# Patient Record
Sex: Female | Born: 1945 | Race: Black or African American | Hispanic: No | Smoking: Former smoker
Health system: Southern US, Community
[De-identification: ages and names within clinical notes are randomized; demographics above are authoritative.]

## PROBLEM LIST (undated history)

## (undated) DIAGNOSIS — G4730 Sleep apnea, unspecified: Secondary | ICD-10-CM

## (undated) DIAGNOSIS — F32A Depression, unspecified: Secondary | ICD-10-CM

## (undated) DIAGNOSIS — F329 Major depressive disorder, single episode, unspecified: Secondary | ICD-10-CM

## (undated) DIAGNOSIS — J45909 Unspecified asthma, uncomplicated: Secondary | ICD-10-CM

## (undated) DIAGNOSIS — I10 Essential (primary) hypertension: Secondary | ICD-10-CM

## (undated) HISTORY — PX: COLON SURGERY: SHX602

## (undated) HISTORY — DX: Sleep apnea, unspecified: G47.30

## (undated) HISTORY — PX: HERNIA REPAIR: SHX51

## (undated) HISTORY — DX: Unspecified asthma, uncomplicated: J45.909

## (undated) HISTORY — DX: Depression, unspecified: F32.A

## (undated) HISTORY — DX: Essential (primary) hypertension: I10

## (undated) HISTORY — PX: ABDOMINAL HYSTERECTOMY: SHX81

## (undated) HISTORY — PX: EYE SURGERY: SHX253

## (undated) HISTORY — DX: Major depressive disorder, single episode, unspecified: F32.9

---

## 2004-01-30 ENCOUNTER — Encounter: Payer: Self-pay | Admitting: Family Medicine

## 2004-03-26 ENCOUNTER — Emergency Department: Payer: Self-pay | Admitting: Internal Medicine

## 2004-09-06 ENCOUNTER — Encounter: Payer: Self-pay | Admitting: Orthopaedic Surgery

## 2004-09-25 ENCOUNTER — Encounter: Payer: Self-pay | Admitting: Orthopaedic Surgery

## 2005-01-06 ENCOUNTER — Ambulatory Visit: Payer: Self-pay | Admitting: Family Medicine

## 2005-09-22 ENCOUNTER — Emergency Department: Payer: Self-pay | Admitting: Internal Medicine

## 2005-10-15 ENCOUNTER — Ambulatory Visit: Payer: Self-pay | Admitting: Family Medicine

## 2006-01-04 ENCOUNTER — Emergency Department: Payer: Self-pay | Admitting: Emergency Medicine

## 2006-04-10 ENCOUNTER — Emergency Department: Payer: Self-pay | Admitting: Emergency Medicine

## 2006-04-21 ENCOUNTER — Other Ambulatory Visit: Payer: Self-pay

## 2006-04-21 ENCOUNTER — Emergency Department: Payer: Self-pay | Admitting: Unknown Physician Specialty

## 2006-07-07 ENCOUNTER — Emergency Department: Payer: Self-pay | Admitting: Emergency Medicine

## 2006-10-11 ENCOUNTER — Emergency Department: Payer: Self-pay | Admitting: Emergency Medicine

## 2006-10-26 ENCOUNTER — Ambulatory Visit: Payer: Self-pay | Admitting: Family Medicine

## 2006-11-10 ENCOUNTER — Encounter: Payer: Self-pay | Admitting: Family Medicine

## 2006-11-26 ENCOUNTER — Encounter: Payer: Self-pay | Admitting: Family Medicine

## 2007-07-14 ENCOUNTER — Inpatient Hospital Stay: Payer: Self-pay | Admitting: *Deleted

## 2007-07-14 ENCOUNTER — Other Ambulatory Visit: Payer: Self-pay

## 2008-06-09 ENCOUNTER — Emergency Department (HOSPITAL_COMMUNITY): Admission: EM | Admit: 2008-06-09 | Discharge: 2008-06-09 | Payer: Self-pay | Admitting: Emergency Medicine

## 2008-06-19 ENCOUNTER — Ambulatory Visit: Payer: Self-pay | Admitting: Gastroenterology
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
## 2010-02-09 ENCOUNTER — Emergency Department: Payer: Self-pay | Admitting: Emergency Medicine

## 2010-07-01 ENCOUNTER — Ambulatory Visit: Payer: Self-pay | Admitting: Emergency Medicine

## 2010-08-05 ENCOUNTER — Ambulatory Visit: Payer: Self-pay | Admitting: Family Medicine

## 2010-08-12 LAB — URINALYSIS, ROUTINE W REFLEX MICROSCOPIC
Bilirubin Urine: NEGATIVE
Glucose, UA: NEGATIVE mg/dL
Hgb urine dipstick: NEGATIVE
Ketones, ur: NEGATIVE mg/dL
Nitrite: NEGATIVE
Protein, ur: NEGATIVE mg/dL
Specific Gravity, Urine: 1.016 (ref 1.005–1.030)
Urobilinogen, UA: 1 mg/dL (ref 0.0–1.0)
pH: 5.5 (ref 5.0–8.0)

## 2010-08-12 LAB — CBC
HCT: 40.7 % (ref 36.0–46.0)
Hemoglobin: 14.1 g/dL (ref 12.0–15.0)
MCHC: 34.7 g/dL (ref 30.0–36.0)
MCV: 84.9 fL (ref 78.0–100.0)
Platelets: 358 10*3/uL (ref 150–400)
RBC: 4.79 MIL/uL (ref 3.87–5.11)
RDW: 13.4 % (ref 11.5–15.5)
WBC: 9.1 10*3/uL (ref 4.0–10.5)

## 2010-08-12 LAB — COMPREHENSIVE METABOLIC PANEL
ALT: 36 U/L — ABNORMAL HIGH (ref 0–35)
AST: 45 U/L — ABNORMAL HIGH (ref 0–37)
Albumin: 3.8 g/dL (ref 3.5–5.2)
Alkaline Phosphatase: 129 U/L — ABNORMAL HIGH (ref 39–117)
BUN: 16 mg/dL (ref 6–23)
CO2: 25 mEq/L (ref 19–32)
Calcium: 9.5 mg/dL (ref 8.4–10.5)
Chloride: 103 mEq/L (ref 96–112)
Creatinine, Ser: 0.84 mg/dL (ref 0.4–1.2)
GFR calc Af Amer: >60 mL/min (ref >60)
GFR calc non Af Amer: >60 mL/min (ref >60)
Glucose, Bld: 117 mg/dL — ABNORMAL HIGH (ref 70–99)
Potassium: 3.8 mEq/L (ref 3.5–5.1)
Sodium: 139 mEq/L (ref 135–145)
Total Bilirubin: 0.7 mg/dL (ref 0.3–1.2)
Total Protein: 7.5 g/dL (ref 6.0–8.3)

## 2010-08-12 LAB — DIFFERENTIAL
Basophils Absolute: 0 10*3/uL (ref 0.0–0.1)
Basophils Relative: 0 % (ref 0–1)
Eosinophils Absolute: 0.2 10*3/uL (ref 0.0–0.7)
Eosinophils Relative: 2 % (ref 0–5)
Lymphocytes Relative: 36 % (ref 12–46)
Lymphs Abs: 3.3 10*3/uL (ref 0.7–4.0)
Monocytes Absolute: 0.8 10*3/uL (ref 0.1–1.0)
Monocytes Relative: 9 % (ref 3–12)
Neutro Abs: 4.8 10*3/uL (ref 1.7–7.7)
Neutrophils Relative %: 53 % (ref 43–77)

## 2010-08-12 LAB — HEMOCCULT GUIAC POC 1CARD (OFFICE): Fecal Occult Bld: POSITIVE

## 2010-08-12 LAB — URINE MICROSCOPIC-ADD ON

## 2011-04-05 ENCOUNTER — Emergency Department: Payer: Self-pay | Admitting: Internal Medicine

## 2011-05-04 ENCOUNTER — Ambulatory Visit: Payer: Self-pay | Admitting: Unknown Physician Specialty

## 2011-05-04 LAB — BASIC METABOLIC PANEL
BUN: 8 mg/dL (ref 7–18)
Calcium, Total: 8.8 mg/dL (ref 8.5–10.1)
Creatinine: 0.63 mg/dL (ref 0.60–1.30)
EGFR (African American): >60
EGFR (Non-African Amer.): >60
Glucose: 111 mg/dL — ABNORMAL HIGH (ref 65–99)
Sodium: 144 mmol/L (ref 136–145)

## 2011-05-04 LAB — URINALYSIS, COMPLETE
Bacteria: NONE SEEN
Bilirubin,UR: NEGATIVE
Glucose,UR: NEGATIVE mg/dL (ref 0–75)
Ketone: NEGATIVE
Ph: 5 (ref 4.5–8.0)
RBC,UR: 2 /HPF (ref 0–5)
Squamous Epithelial: 3
WBC UR: 1 /HPF (ref 0–5)

## 2011-05-04 LAB — CBC
HGB: 13.9 g/dL (ref 12.0–16.0)
MCH: 29.6 pg (ref 26.0–34.0)
MCHC: 34.2 g/dL (ref 32.0–36.0)
MCV: 87 fL (ref 80–100)
RBC: 4.69 10*6/uL (ref 3.80–5.20)

## 2011-05-14 ENCOUNTER — Inpatient Hospital Stay: Payer: Self-pay | Admitting: Unknown Physician Specialty

## 2011-05-15 LAB — HEMOGLOBIN: HGB: 11.9 g/dL — ABNORMAL LOW (ref 12.0–16.0)

## 2011-05-15 LAB — URINALYSIS, COMPLETE
Bacteria: NONE SEEN
Glucose,UR: NEGATIVE mg/dL (ref 0–75)
Ketone: NEGATIVE
Protein: NEGATIVE
RBC,UR: 6 /HPF (ref 0–5)
Specific Gravity: 1.011 (ref 1.003–1.030)
Squamous Epithelial: NONE SEEN
WBC UR: 2 /HPF (ref 0–5)

## 2011-05-15 LAB — TSH: Thyroid Stimulating Horm: 1.74 u[IU]/mL

## 2011-05-15 LAB — BASIC METABOLIC PANEL
BUN: 19 mg/dL — ABNORMAL HIGH (ref 7–18)
Chloride: 101 mmol/L (ref 98–107)
Creatinine: 1.02 mg/dL (ref 0.60–1.30)
EGFR (African American): >60
EGFR (Non-African Amer.): 58 — ABNORMAL LOW
Glucose: 136 mg/dL — ABNORMAL HIGH (ref 65–99)

## 2011-05-15 LAB — TROPONIN I: Troponin-I: <0.02 ng/mL

## 2011-05-15 LAB — CK-MB
CK-MB: 3.7 ng/mL — ABNORMAL HIGH (ref 0.5–3.6)
CK-MB: 4.9 ng/mL — ABNORMAL HIGH (ref 0.5–3.6)

## 2011-05-16 LAB — TROPONIN I: Troponin-I: <0.02 ng/mL

## 2011-05-20 ENCOUNTER — Inpatient Hospital Stay: Payer: Self-pay | Admitting: Specialist

## 2011-05-20 LAB — COMPREHENSIVE METABOLIC PANEL
Albumin: 3.1 g/dL — ABNORMAL LOW (ref 3.4–5.0)
Alkaline Phosphatase: 93 U/L (ref 50–136)
BUN: 12 mg/dL (ref 7–18)
Bilirubin,Total: 1.2 mg/dL — ABNORMAL HIGH (ref 0.2–1.0)
Chloride: 99 mmol/L (ref 98–107)
Creatinine: 0.92 mg/dL (ref 0.60–1.30)
EGFR (African American): >60
Glucose: 102 mg/dL — ABNORMAL HIGH (ref 65–99)
SGOT(AST): 28 U/L (ref 15–37)
SGPT (ALT): 22 U/L
Total Protein: 7.2 g/dL (ref 6.4–8.2)

## 2011-05-20 LAB — CBC
MCH: 28.9 pg (ref 26.0–34.0)
MCHC: 33.6 g/dL (ref 32.0–36.0)
Platelet: 364 10*3/uL (ref 150–440)
RDW: 13.4 % (ref 11.5–14.5)

## 2011-05-20 LAB — TROPONIN I: Troponin-I: <0.02 ng/mL

## 2011-05-20 LAB — CK TOTAL AND CKMB (NOT AT ARMC): CK-MB: 0.6 ng/mL (ref 0.5–3.6)

## 2011-05-21 LAB — CBC WITH DIFFERENTIAL/PLATELET
Basophil #: 0 10*3/uL (ref 0.0–0.1)
Eosinophil #: 0 10*3/uL (ref 0.0–0.7)
HGB: 10.6 g/dL — ABNORMAL LOW (ref 12.0–16.0)
Lymphocyte #: 1.1 10*3/uL (ref 1.0–3.6)
MCH: 28.9 pg (ref 26.0–34.0)
MCHC: 33.3 g/dL (ref 32.0–36.0)
Monocyte #: 0.2 10*3/uL (ref 0.0–0.7)
Neutrophil #: 13.5 10*3/uL — ABNORMAL HIGH (ref 1.4–6.5)
Platelet: 370 10*3/uL (ref 150–440)
RDW: 13.3 % (ref 11.5–14.5)

## 2011-05-22 ENCOUNTER — Emergency Department: Payer: Self-pay | Admitting: Emergency Medicine

## 2011-05-22 LAB — COMPREHENSIVE METABOLIC PANEL
Alkaline Phosphatase: 91 U/L (ref 50–136)
Calcium, Total: 9.2 mg/dL (ref 8.5–10.1)
Co2: 26 mmol/L (ref 21–32)
EGFR (Non-African Amer.): 54 — ABNORMAL LOW
Glucose: 130 mg/dL — ABNORMAL HIGH (ref 65–99)
Osmolality: 283 (ref 275–301)
SGOT(AST): 21 U/L (ref 15–37)
SGPT (ALT): 21 U/L

## 2011-05-22 LAB — CBC
HGB: 10.6 g/dL — ABNORMAL LOW (ref 12.0–16.0)
RBC: 3.62 10*6/uL — ABNORMAL LOW (ref 3.80–5.20)

## 2011-05-22 LAB — TROPONIN I: Troponin-I: <0.02 ng/mL

## 2011-09-22 ENCOUNTER — Ambulatory Visit: Payer: Self-pay | Admitting: Specialist

## 2011-10-25 ENCOUNTER — Emergency Department: Payer: Self-pay | Admitting: Unknown Physician Specialty

## 2011-10-25 LAB — CBC
HCT: 38.7 % (ref 35.0–47.0)
HGB: 13 g/dL (ref 12.0–16.0)
MCH: 28.5 pg (ref 26.0–34.0)
MCHC: 33.7 g/dL (ref 32.0–36.0)
RBC: 4.58 10*6/uL (ref 3.80–5.20)
RDW: 14.7 % — ABNORMAL HIGH (ref 11.5–14.5)
WBC: 8.1 10*3/uL (ref 3.6–11.0)

## 2011-10-25 LAB — BASIC METABOLIC PANEL
Chloride: 110 mmol/L — ABNORMAL HIGH (ref 98–107)
Co2: 26 mmol/L (ref 21–32)
Creatinine: 0.9 mg/dL (ref 0.60–1.30)
EGFR (Non-African Amer.): >60
Osmolality: 284 (ref 275–301)
Sodium: 142 mmol/L (ref 136–145)

## 2011-10-25 LAB — TROPONIN I: Troponin-I: <0.02 ng/mL

## 2011-12-07 ENCOUNTER — Ambulatory Visit: Payer: Self-pay | Admitting: Internal Medicine

## 2011-12-09 ENCOUNTER — Ambulatory Visit: Payer: Self-pay | Admitting: Specialist

## 2011-12-24 ENCOUNTER — Ambulatory Visit: Payer: Self-pay | Admitting: Family Medicine

## 2011-12-29 ENCOUNTER — Ambulatory Visit: Payer: Self-pay | Admitting: Specialist

## 2012-03-11 ENCOUNTER — Ambulatory Visit: Payer: Self-pay | Admitting: Family Medicine

## 2013-04-03 IMAGING — CR DG CHEST 2V
1 series · 3 of 3 positions shown · non-contrast
Comparison: none

REASON FOR EXAM: COPD; HTN
COMMENTS:

PROCEDURE:     DXR - DXR CHEST PA (OR AP) AND LATERAL  - May 04, 2011  [DATE]
RESULT:     The lung fields are clear. The heart, mediastinal and osseous
structures show no significant abnormalities.

[Series 1: w chest pa · 0.14mm/px · 3 of 3 slices shown]
[im 1/3]
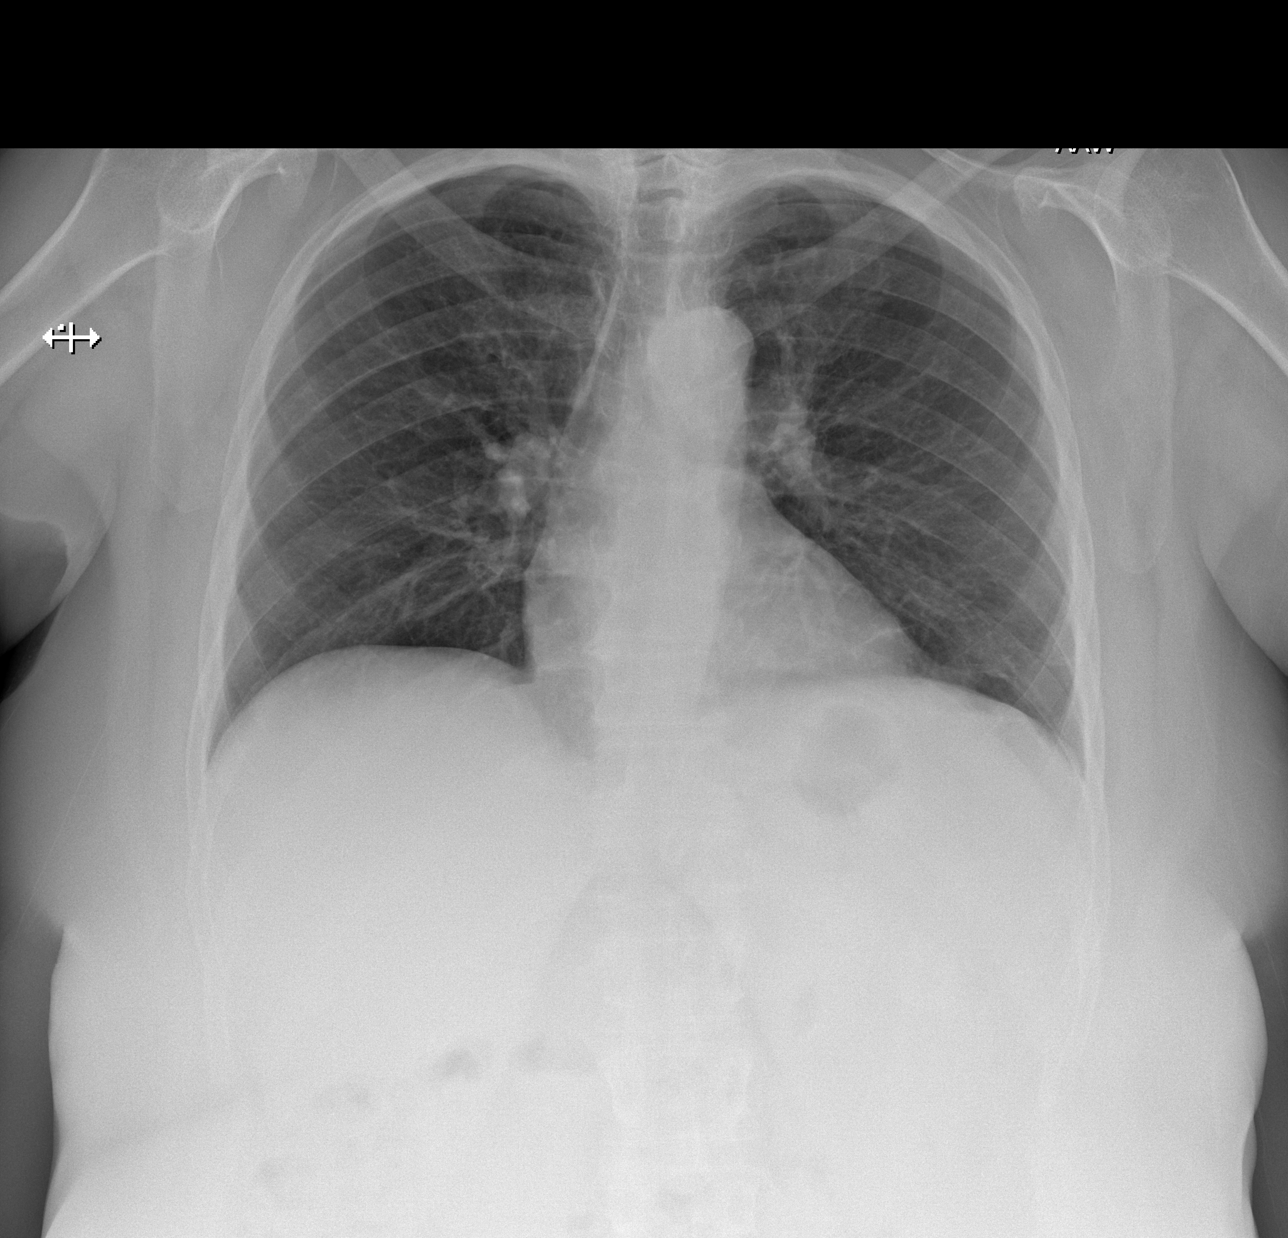
[im 2/3]
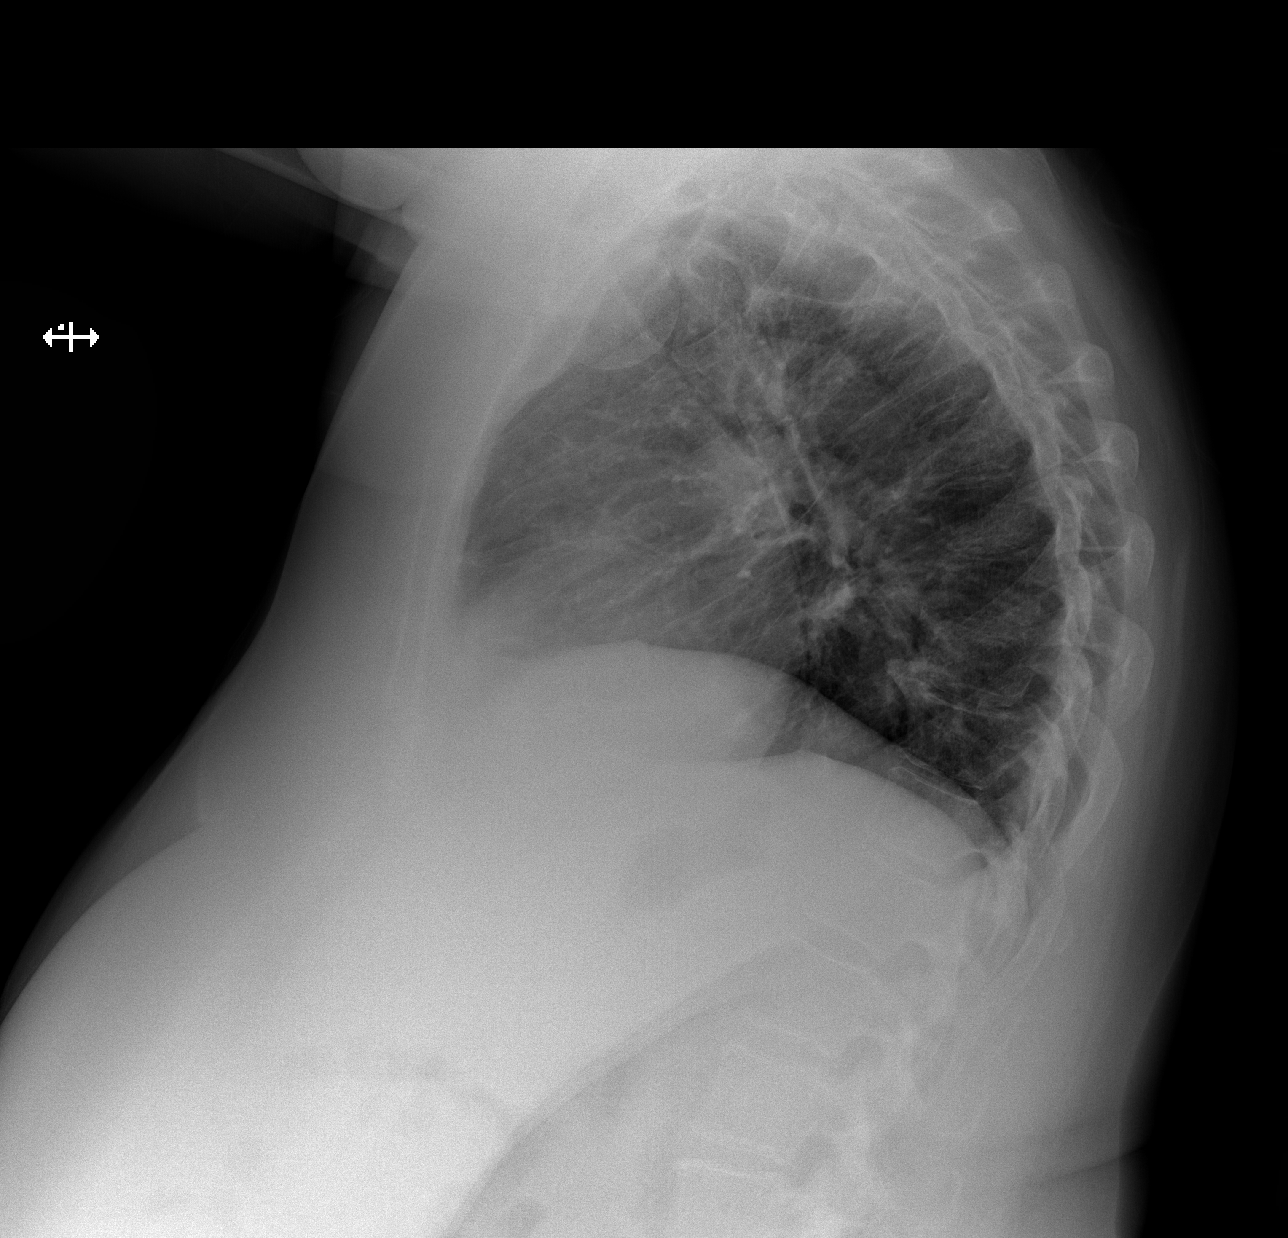
[im 3/3]
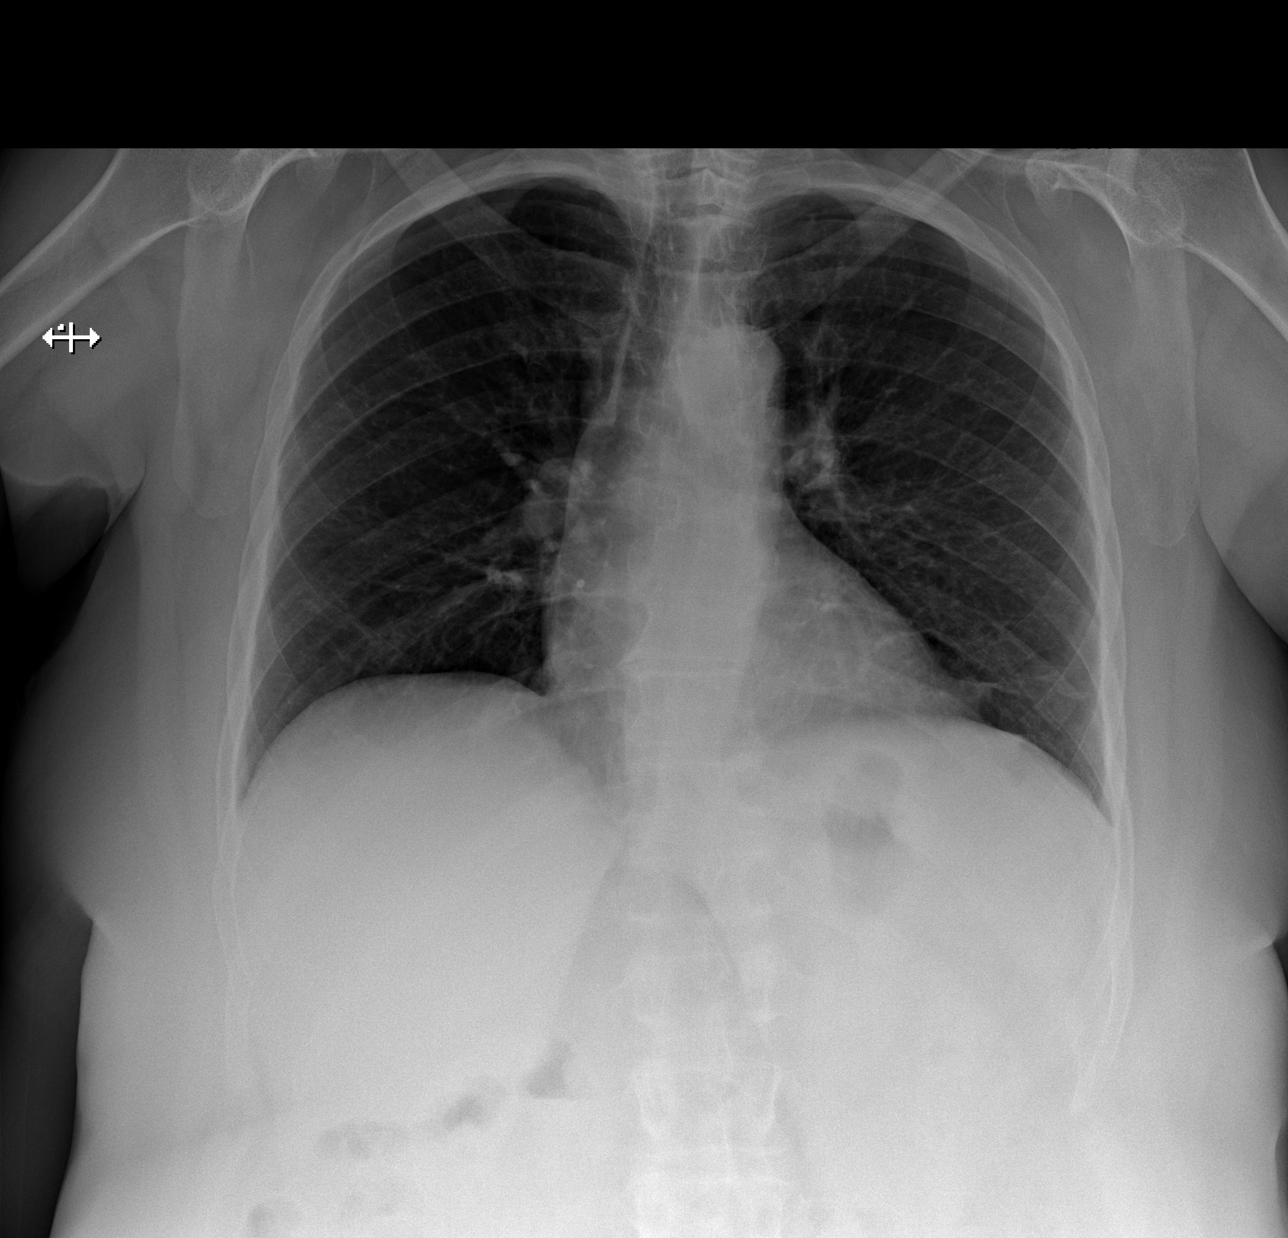

[3 of 3 positions shown; findings below may reference images not displayed]

IMPRESSION: No significant abnormalities are noted.

## 2013-11-08 IMAGING — CT CT CHEST W/ CM
1 series · 15 of 34 positions shown, 19 images · non-contrast
Comparison: none

REASON FOR EXAM: [HOSPITAL] LABS pulmonary nodules FU
COMMENTS:

[Series 2: chest w/ 3.0 i41f 3 · axial · 0.67mm/px · z∈[-721,-484]mm · 15 of 93 slices shown, 19 images]
[im 7/93  mediastinal]
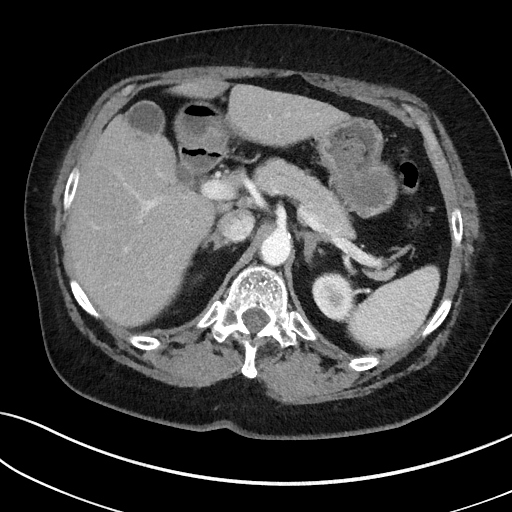
[im 7/93  lung]
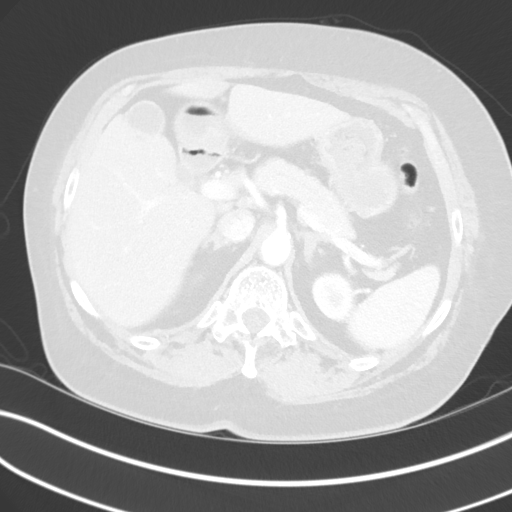
[im 14/93  lung]
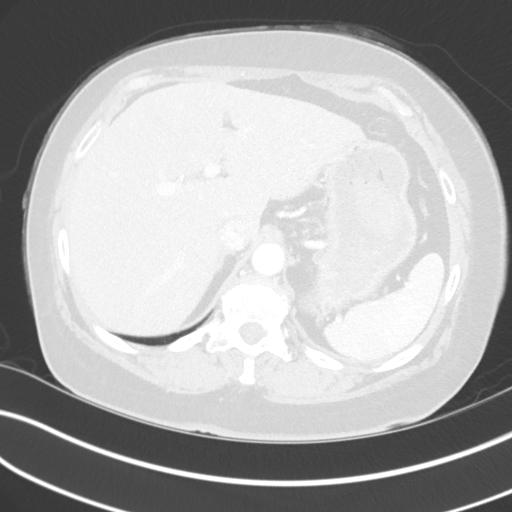
[im 19/93  lung]
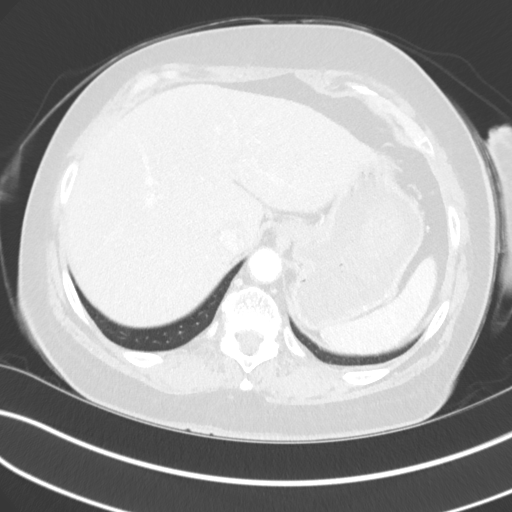
[im 24/93  lung]
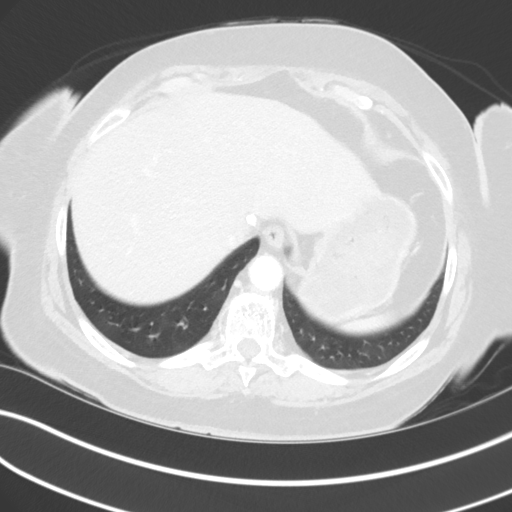
[im 31/93  mediastinal]
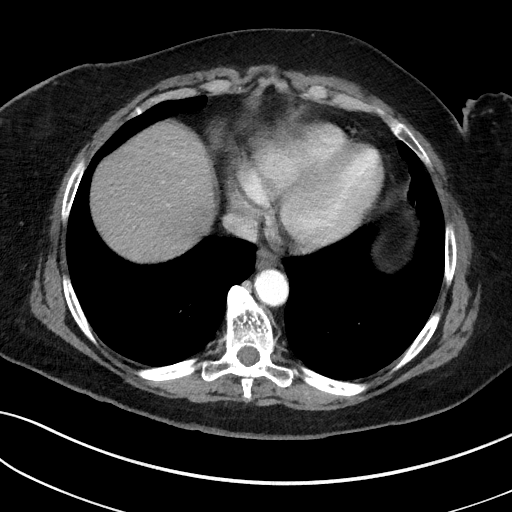
[im 31/93  lung]
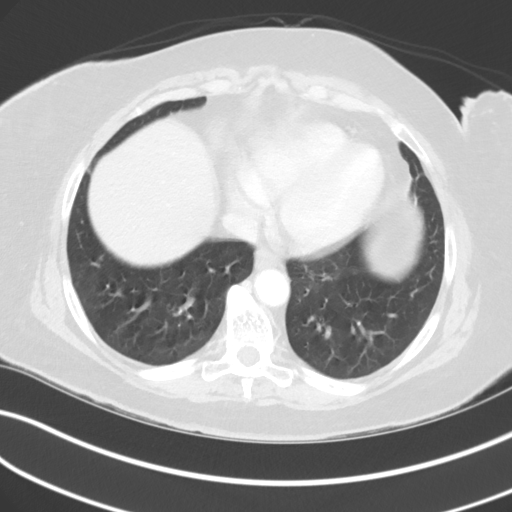
[im 37/93  lung]
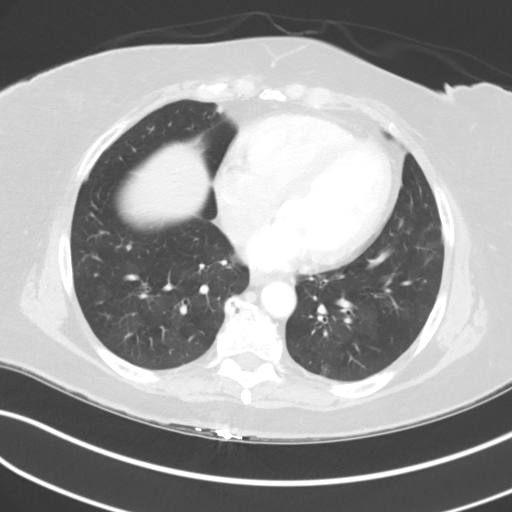
[im 41/93  lung]
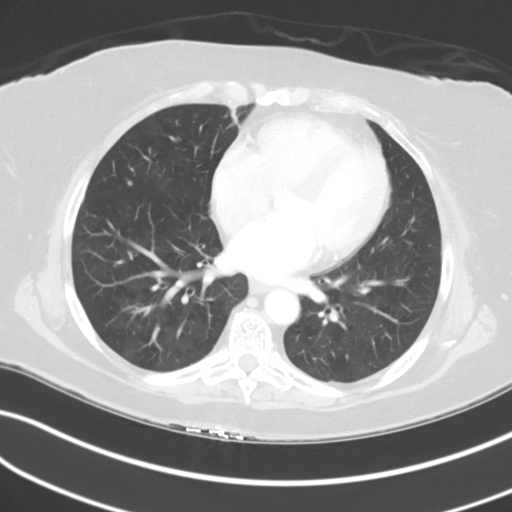
[im 48/93  lung]
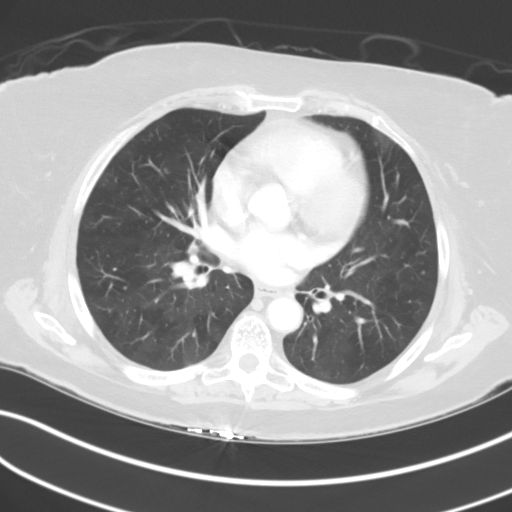
[im 52/93  mediastinal]
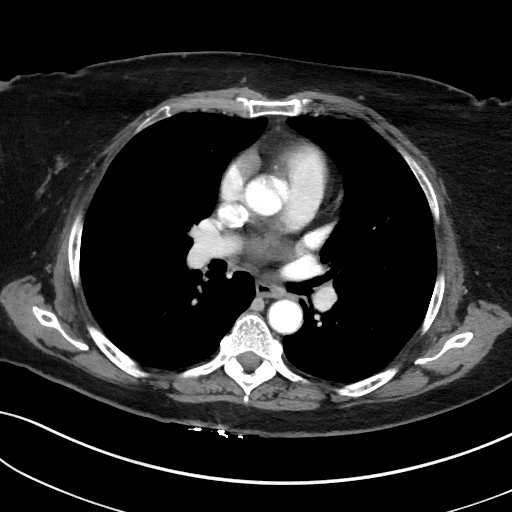
[im 52/93  lung]
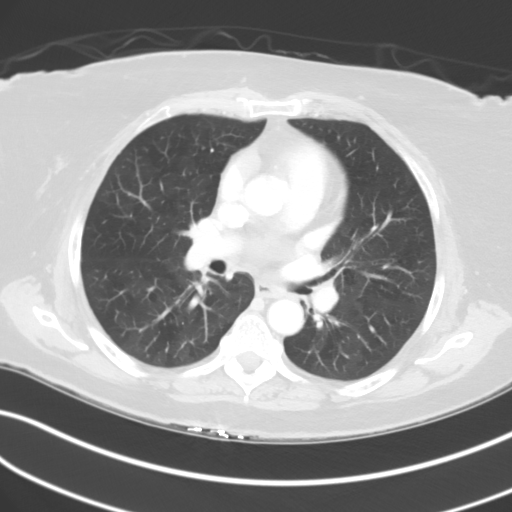
[im 56/93  lung]
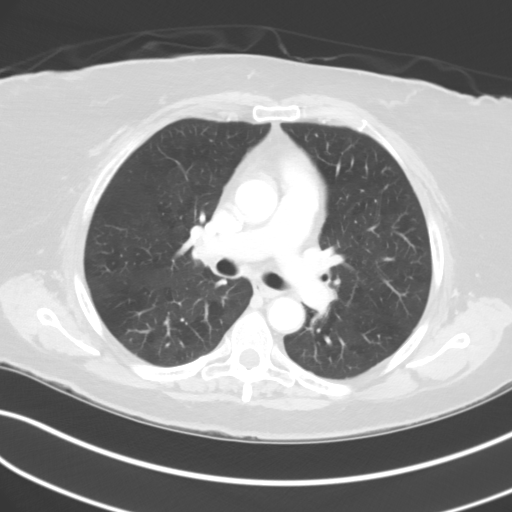
[im 62/93  lung]
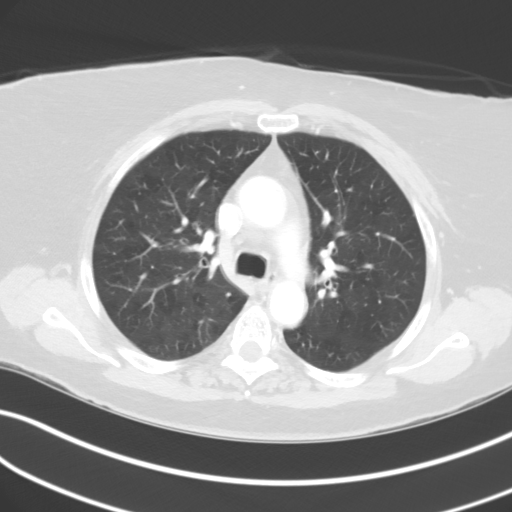
[im 69/93  lung]
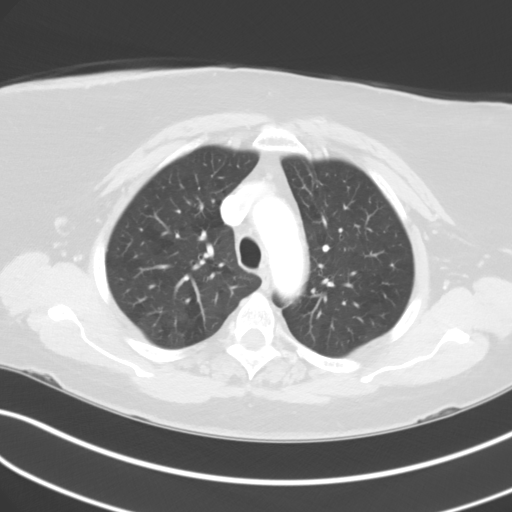
[im 74/93  mediastinal]
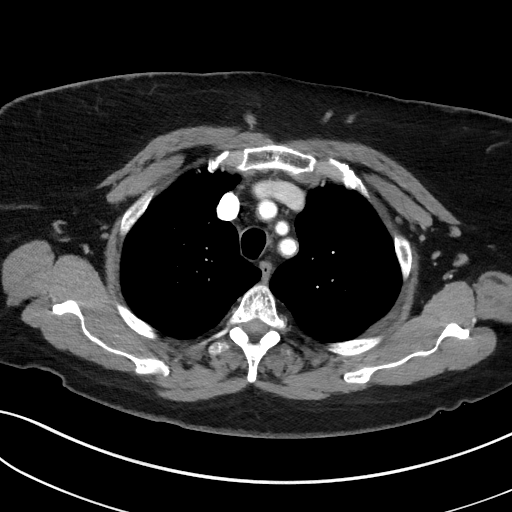
[im 74/93  lung]
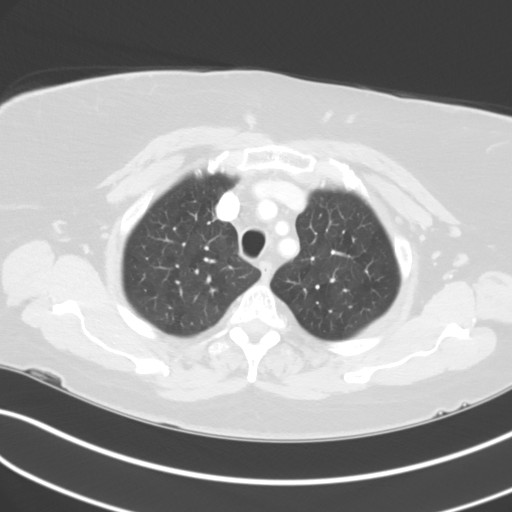
[im 79/93  lung]
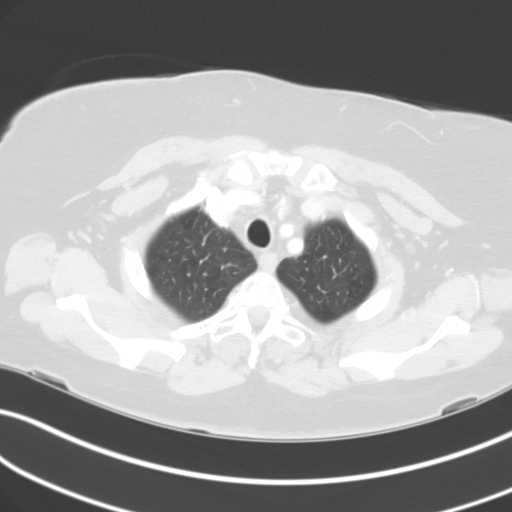
[im 86/93  lung]
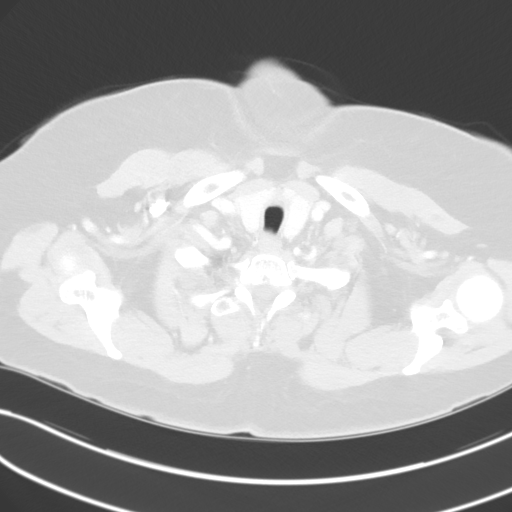

[15 of 34 positions shown; findings below may reference images not displayed]

PROCEDURE:     KCT - KCT CHEST WITH CONTRAST  - December 09, 2011  [DATE]

RESULT:     Axial CT scanning was performed through the chest with
reconstructions at 3 mm intervals and slice thicknesses following
intravenous administration of 75 cc of Ksovue-R2C. Review of multiplanar
reconstructed images was performed separately on the VIA monitor. Comparison
is made to the study May 20, 2011.

The lungs are well-expanded. No interstitial nor alveolar infiltrates are
present. Subtle patchy density likely reflects underlying emphysema. There
are scattered stable appearing 2 to 3 mm diameter soft tissue density
nodules in both lungs. The largest is seen on image 43 in the posterior
inferior aspect of the right upper lobe. It measures 3 mm in diameter. At
mediastinal window settings the cardiac chambers are normal in size. The
caliber of the thoracic aorta is normal. There are no pathologic sized
mediastinal or hilar lymph nodes. There is no pleural nor pericardial
effusion. Within the upper abdomen the observed portions of the liver and
spleen and gallbladder appear normal. There are no adrenal masses. The
thoracic vertebral bodies are preserved in height.
IMPRESSION: 1. There are multiple 2 to 3 mm diameter nodules in both lungs which appear
stable. No new nodules are evident.
2. There is no mediastinal nor hilar lymphadenopathy. There is no evidence
of CHF nor of pneumonia. No significant atelectatic change at the lung bases
is demonstrated today.

## 2013-12-13 ENCOUNTER — Encounter: Payer: Self-pay | Admitting: Family Medicine

## 2014-03-27 ENCOUNTER — Ambulatory Visit: Payer: Self-pay | Admitting: Ophthalmology

## 2014-03-27 DIAGNOSIS — Z01810 Encounter for preprocedural cardiovascular examination: Secondary | ICD-10-CM

## 2014-03-27 DIAGNOSIS — I10 Essential (primary) hypertension: Secondary | ICD-10-CM

## 2014-03-27 LAB — POTASSIUM: POTASSIUM: 4.4 mmol/L (ref 3.5–5.1)

## 2014-04-10 ENCOUNTER — Ambulatory Visit: Payer: Self-pay | Admitting: Ophthalmology

## 2014-06-13 ENCOUNTER — Emergency Department: Payer: Self-pay | Admitting: Emergency Medicine

## 2014-08-18 NOTE — Op Note (Signed)
PATIENT NAME:  Marie Pham, Marie Pham MR#:  [id number] DATE OF BIRTH:  1945/07/28  DATE OF PROCEDURE:  04/10/2014  PREOPERATIVE DIAGNOSIS:  Nuclear sclerotic cataract of the right eye.   POSTOPERATIVE DIAGNOSIS:  Nuclear sclerotic cataract of the right eye.   OPERATIVE PROCEDURE:  Cataract extraction by phacoemulsification with implant of intraocular lens to right eye.   SURGEON:  Galen ManilaWilliam Evelynne Spiers, MD.   ANESTHESIA:  1. Managed anesthesia care.  2. Topical tetracaine drops followed by 2% Xylocaine jelly applied in the preoperative holding area.   COMPLICATIONS:  None.   TECHNIQUE:   Stop and chop   DESCRIPTION OF PROCEDURE:  The patient was examined and consented in the preoperative holding area where the aforementioned topical anesthesia was applied to the right eye and then brought back to the Operating Room where the right eye was prepped and draped in the usual sterile ophthalmic fashion and a lid speculum was placed. A paracentesis was created with the side port blade and the anterior chamber was filled with viscoelastic. A near clear corneal incision was performed with the steel keratome. A continuous curvilinear capsulorrhexis was performed with a cystotome followed by the capsulorrhexis forceps. Hydrodissection and hydrodelineation were carried out with BSS on a blunt cannula. The lens was removed in a stop and chop technique and the remaining cortical material was removed with the irrigation-aspiration handpiece. The capsular bag was inflated with viscoelastic and the Tecnis ZCB00 22.0-diopter lens, serial number [id number] was placed in the capsular bag without complication. The remaining viscoelastic was removed from the eye with the irrigation-aspiration handpiece. The wounds were hydrated. The anterior chamber was flushed with Miostat and the eye was inflated to physiologic pressure. Cefuroxime was not placed in the eye due to a penicillin allergy; rather a 3:1 dilution of Vigamox was placed  in the anterior chamber. The wounds were found to be water tight. The eye was dressed with Vigamox. The patient was given protective glasses to wear throughout the day and a shield with which to sleep tonight. The patient was also given drops with which to begin a drop regimen today and will follow-up with me in one day.    ____________________________ Jerilee FieldWilliam L. Masami Plata, MD wlp:jp D: 04/10/2014 21:57:00 ET T: 04/11/2014 08:16:42 ET JOB#: [id number]  cc: [name] Mckissack L. Rocco Kerkhoff, MD, <Dictator> Jerilee FieldWILLIAM L Peniel Biel MD ELECTRONICALLY SIGNED 04/11/2014 13:56

## 2014-08-19 NOTE — H&P (Signed)
PATIENT NAME:  Marie Pham, Marie Pham MR#:  [id number] DATE OF BIRTH:  Dec 24, 1945  DATE OF ADMISSION:  05/20/2011  PRIMARY CARE PHYSICIAN: Dr. Hillery AldoSarah Patel  ER PHYSICIAN: Dr. Sharma CovertNorman    CHIEF COMPLAINT: Hypoxia and trouble breathing.   HISTORY OF PRESENT ILLNESS: Patient is a 69 year old female with history of hypertension, gastroesophageal reflux disease, asthma, and recent left hip arthroplasty went home on 01/21, came in today because of worsening trouble breathing and cough. Patient's saturations were 79% on room air and on 2 liters her sats are around 100%. Patient uses CPAP with oxygen at night 2 liters but during the daytime because of trouble breathing she came in. She denies any chest pain. No orthopnea. No pedal edema. Noticed some cough with phlegm but she thinks it is because of asthma. Denies any complaints. She has been progressing well with her physical therapy. She had a CT of the chest which is negative for pulmonary embolus but she does complain of stuffy nose and sinus problems, feels very congested, unable to take deep breaths and she thinks is sinus that is causing. Patient has been evaluated by the ER physician. Chest x-ray and CAT scans are showing no acute changes but because of hypoxia we are going to observe her.    PAST MEDICAL HISTORY:  1. Hypertension. 2. Gastroesophageal reflux disease. 3. Asthma. 4. Degenerative joint disease.  5. Recent left hip arthroplasty.   HOME MEDICATIONS:  1. ProAir 2 sprays as needed.  2. Nebulizers. 3. Multivitamin. 4. Latanoprost 0.005% in each eye at bedtime.  5. Ranitidine 75 mg p.o. b.i.d.  6. She was on oxycodone 5 mg q.6 hours but she says she doesn't want to take anymore because she gets hallucinations with that. 7. HCTZ with triamterene 25/37.5 mg p.o. daily.  8. Advair Diskus 250/50, 1 puff daily.  9. She also says she does not want to take Advair; whenever she takes she feels trouble breathing.   ALLERGIES: Penicillin, Prozac and  Wellbutrin.   SOCIAL HISTORY: She was a previous smoker, quite in [id number]. No alcohol.   FAMILY HISTORY: Significant for diabetes and hypertension.   PAST SURGICAL HISTORY:  1. Hernia surgery. 2. Eye surgery.  3. Recent left hip arthroplasty.   REVIEW OF SYSTEMS: CONSTITUTIONAL: Has trouble breathing but no fever. EYES: No blurred vision. ENT: No tinnitus. No epistaxis. Patient feels very stuffy and congested in the nose and having trouble breathing and sinus pain. RESPIRATORY: Has some cough and trouble breathing since last two days and has history of asthma. CARDIOVASCULAR: No chest pain. No palpitations. No pedal edema. No dyspnea on exertion. GASTROINTESTINAL: No nausea, vomiting. No abdominal pain. GENITOURINARY: No dysuria. ENDOCRINE: No polyuria, nocturia. INTEGUMENTARY: No skin rashes. MUSCULOSKELETAL: Left hip arthroplasty recently and has slight pain but says that she can tolerate it and ambulating well and does not want to take oxycodone. NEUROLOGIC: No numbness or weakness. PSYCH: No anxiety or insomnia.   PHYSICAL EXAMINATION:  VITAL SIGNS: Blood pressure 150/79, pulse 108, respirations 23, sats 79% on room air and on 2 liters 90%, temperature 97.5.   GENERAL: Patient is alert, awake, oriented, obese female not in distress at this time.   HEENT: Head atraumatic, normocephalic. Pupils are equally reacting to light. Extraocular movements are intact. ENT: No tympanic membrane congestion. Nose: Patient admits of hypertrophy and very narrow passages. She does have sinus tenderness on both maxillary sinuses. Throat is clear. Patient has no oropharyngeal erythema.   NECK: Normal thyroid, nontender. No lymphadenopathy. No JVD.  No carotid bruits.   RESPIRATORY: Bilaterally clear to auscultation. She does not have any wheeze. Patient not using accessory muscles of respiration.   CARDIOVASCULAR: She is tachycardic but no murmurs. PMI not displaced. Pedal pulses are present.   ABDOMEN: Soft,  obese. Bowel sounds present. No organomegaly.   MUSCULOSKELETAL: Power 5/5 in upper and lower extremities.   SKIN: No skin rashes.   NEUROLOGICAL: Patient's cranial nerves are intact. No dysarthria.   PSYCH: Patient is alert, awake, oriented.   LABORATORY, DIAGNOSTIC, AND RADIOLOGICAL DATA:  CT of the chest showed no evidence of pulmonary emboli and pulmonary arteries are patent. Patient has base atelectasis versus mild infiltrate noted.   Chest x-ray showed lungs are hypoinflated. No focal infiltrate. WBC 13.6, hemoglobin 10.1, hematocrit 30.1, platelets 364. Electrolytes: Sodium 137, potassium 3.5, chloride 99, bicarbonate 27, BUN 12, creatinine 0.92, glucose 102. CK 195, CPK 0.6, troponin less than 0.02. EKG shows sinus tachycardia with 107 beats per minute.   ASSESSMENT AND PLAN: 69 year old female patient with:  1. Hypoxia and respiratory distress likely secondary to sinusitis and bronchitis. She will be on oxygen 2 liters. Continue Levaquin 500 mg p.o. daily. ProAir for her asthma. She does not want to take Advair so continue the ProAir along with oxygen and also low dose steroids.  2. Sinusitis. She will be on the nasal spray. Patient's CT of the chest did not show any pulmonary emboli. CT of the chest showed possible infiltrate. Anyway she is on Levaquin and oxygen and steroids. Will also get BNP, if it is elevated will check the echocardiogram.  3. Sleep apnea. Uses CPAP at night.  4. Hypertension. Continue home medications which are HCTZ/triamterene. 5. History of gastroesophageal reflux disease on PPIs. 6. Recent left hip arthroplasty. Is ambulating well with physical therapy. Continue that and use Tylenol p.r.n. Does not want oxycodone. Anyway the pain is controlled so we will just use Tylenol for the pain.  TOTAL TIME SPENT ON HISTORY AND PHYSICAL: 60 minutes.    ____________________________ Katha Hamming, MD sk:cms D: 05/20/2011 18:16:47 ET T: 05/21/2011 06:09:08  ET JOB#: [id number]  cc: Katha Hamming, MD, <Dictator> Sarah "Sallie" Allena Katz, MD Katha Hamming MD ELECTRONICALLY SIGNED 06/15/2011 16:17

## 2014-08-19 NOTE — Discharge Summary (Signed)
PATIENT NAME:  Marie GurneyMOORE, Jalyah MR#:  [id number] DATE OF BIRTH:  [birth year]  DATE OF ADMISSION:  05/20/2011 DATE OF DISCHARGE:  05/21/2011  For a detailed note, please take a look at the history and physical done by Dr. Luberta MutterKonidena on admission.   DIAGNOSES AT DISCHARGE:  1. Asthma exacerbation.  2. Shortness of breath, likely hypoxemia secondary to the asthma exacerbation.  3. Hypertension.  4. Gastroesophageal reflux disease.  5. Glaucoma.   DIET: The patient was discharged on a low sodium diet.   ACTIVITY: As tolerated.   FOLLOW-UP: Follow-up with Dr. Hillery AldoSarah Patel in the next 1 to 2 weeks.    DISCHARGE MEDICATIONS:  1. Albuterol inhaler 2 puffs as needed.  2. Advair 250/50 1 puff b.i.d.  3. Multivitamin daily.  4. Nebulizer DuoNebs as needed.  5. Maxzide 1 tab daily.  6. Latanoprost 0.005% one drop to each eye at bedtime. 7. Oxycodone 5 mg 1 to 2 tabs q.6 hours p.r.n. pain.  8. Ranitidine 75 mg b.i.d.  9. Prednisone taper starting at 50, down to 10 over the next five days. 10. Levaquin 500 mg daily x5 days.   PERTINENT STUDIES DONE DURING THE HOSPITAL COURSE:  1. Chest x-ray done on admission showing bilateral pulmonary hypoinflation.  2. CT of the chest done with contrast showing no evidence of pulmonary emboli, tiny nonspecific pulmonary nodules and base atelectasis, coronary disease.   HOSPITAL COURSE: This is a 69 year old female with medical problems as mentioned above who presented to the hospital with shortness of breath and hypoxemia.  1. Asthma exacerbation. This is likely the cause of the patient's hypoxemia and shortness of breath. Since the shortness of breath was acute in nature, she did have a CT of the chest to rule out a pulmonary embolism which was negative. She also had three sets of cardiac markers checked which were negative for any acute myocardial ischemia. After receiving some steroids and IV Levaquin, the patient's clinical symptoms have significantly improved.  Her room air sats are anywhere between 95 to 98%. She also ambulated on room air without any further desaturations. She was discharged home on a prednisone taper and Levaquin empirically as stated.  2. Hypertension. The patient remained hemodynamically stable on her Maxzide which she will resume.  3. Glaucoma. The patient was maintained on her latanoprost eyedrops and she will resume that.  4. Gastroesophageal reflux disease. The patient was maintained on her Protonix. She will resume that as an outpatient.  5. Chronic pain. The patient does take oxycodone for pain which she will resume.   CODE STATUS: The patient is a FULL CODE.   TIME SPENT: 35 minutes.  ____________________________ Rolly PancakeVivek J. Cherlynn KaiserSainani, MD vjs:drc D: 05/21/2011 15:29:45 ET T: 05/22/2011 10:14:31 ET JOB#: [id number]  cc: Rolly PancakeVivek J. Cherlynn KaiserSainani, MD, <Dictator> Sarah "Sallie" Allena KatzPatel, MD Houston SirenVIVEK J SAINANI MD ELECTRONICALLY SIGNED 05/22/2011 12:23

## 2014-08-19 NOTE — Op Note (Signed)
PATIENT NAME:  Marie GurneyMOORE, Maretta MR#:  [id number] DATE OF BIRTH:  [birth year]  DATE OF PROCEDURE:  05/14/2011  PREOPERATIVE DIAGNOSIS: Degenerative arthritis, left hip.   POSTOPERATIVE DIAGNOSIS: Degenerative arthritis, left hip.   PROCEDURE: Uncemented left total hip.   SURGEON: Winn JockJames C. Gerrit Heckaliff, MD    ASSISTANT: Van ClinesJon Wolfe, PA-C   ANESTHESIA: Spinal.   ESTIMATED BLOOD LOSS: 100 mL.  REPLACEMENT: 1400 mL Crystalloid.   DRAINS: None.   COMPLICATIONS: None.   SPECIMEN SENT: One femoral head.   IMPLANTS USED: Stryker Trident PSL HA 52 mm shell, secure fit max 132 degree #9 stem, +5 36 mm metal head, two cancellus screws.   BRIEF CLINICAL NOTE AND PATHOLOGY: The patient had progressive bilateral hip pain left side much greater than right. She failed treatment of exercises and anti-inflammatory medications. Options, risks, and benefits were discussed in detail and she elected to proceed with operative intervention. At time of the procedure, there was severe degenerative change. She had slight shortening on the left side and was measured to have been lengthened approximately 1 cm. She had degenerative changes on the right side and anticipate a total hip replacement on that side.   DESCRIPTION OF PROCEDURE: Preop antibiotics, adequate anesthesia, right lateral decubitus position, all prominences well padded. Routine prepping and draping. Appropriate time-out was called.   Routine posterior approach was made. Fascia opened in line with the incision. Piriformis was tagged and reflected after Charnley retractor placed. Leg length was measured by placing two pins in the pelvis paralleling along the anterior/superior iliac spine and the sciatic notch with a drill bit in the greater trochanter.   The posterior capsule was then removed and the hip was dislocated. Reaming was progressed. This was followed by the neck cut and then broaching. The size 9 fit very nicely.   The incision was thoroughly  irrigated throughout the procedure.   Attention was then turned to the acetabulum. The surrounding edges were cleaned. Reaming was then performed and progressed up to 52 mm. It was thoroughly irrigated. The implant was then inserted after trial was tried. It seated very nicely. It was extremely stable with two screws placed for additional support. The liner was placed. Hip was reduced and was extremely stable.   Attention was then turned back to the femur where the broach was removed. The canal was thoroughly irrigated and the implant was then inserted. It seated very nicely. The +0 and +5 heads were used, +5 gave excellent range of motion, approximately 1 cm of lengthening. The actual head was then placed. The hip was stable in extension and external rotation, flexion to 90 degrees, adduction of 35 degrees with internal rotation of 35 degrees.   Incision was thoroughly irrigated. Piriform was repaired with #5 nonabsorbable suture. The sciatic nerve was without any obvious compression. The fascia was closed with #2 Quill. Sub-Q was closed with 0 Quill. Skin closed with staples. Soft sterile dressing was applied. Hemostasis was very good. Sponge and needle counts were reported as correct prior to and after wound closure. The patient was awakened and taken to the PAC-U having tolerated the procedure well.   ____________________________ Winn JockJames C. Gerrit Heckaliff, MD jcc:drc D: 05/15/2011 14:50:26 ET T: 05/15/2011 16:02:00 ET JOB#: [id number]  cc: Winn JockJames C. Gerrit Heckaliff, MD, <Dictator> Winn JockJAMES C Damaris Geers MD ELECTRONICALLY SIGNED 05/22/2011 12:27

## 2014-08-19 NOTE — Discharge Summary (Signed)
PATIENT NAME:  Marie GurneyMOORE, Aricela MR#:  [id number] DATE OF BIRTH:  [birth year]  DATE OF ADMISSION:  05/14/2011 DATE OF DISCHARGE:  05/18/2011  ADMITTING DIAGNOSIS: Degenerative arthritis left hip.   DISCHARGE DIAGNOSIS: Degenerative arthritis left hip.   OPERATION: On 05/14/2011 the patient had uncemented left total hip replacement.   SURGEON: Dr. Gerrit Heckaliff   ASSISTANT: Van ClinesJon Wolfe, PA-C   ESTIMATED BLOOD LOSS: 100 mL with replacement of 1400 mL of crystalloid.   ANESTHESIA: Spinal.   DRAINS: None.   COMPLICATIONS: None.   SPECIMENS: One femoral head.   IMPLANTS: Stryker Trident PSL HA 52 mm shell, Secure Fit Max 132 degree #9 stem, +5 36 mm metal head, two cancellus screws. Patient was stabilized, brought to the recovery room, brought down to the orthopedic floor for physical therapy.   HISTORY: The patient is a 69 year old female who presented for persistent left hip pain. The patient has had difficulty with activities of daily living. The patient has had persistent pain and had to be cautious with activities.   PHYSICAL EXAMINATION: GENERAL: Alert female with difficulty with limping and pain on the left side. CARDIAC: Normal. CHEST: Clear. MUSCULOSKELETAL: In regard to left lower extremity, the patient has decreased internal and external rotation with pain. The patient has no specific shortening. The patient is neurovascularly intact. X-rays revealed progressive degenerative arthritis bilaterally with the left hip being more progressive.   HOSPITAL COURSE: After initial admission on 05/14/2011 patient brought to the orthopedic floor. On postoperative day one the patient had hemoglobin of 11.9 which dropped down to 10.3 on postoperative day two with no transfusion given. The patient did have tachycardia and was evaluated postoperatively by medicine and did have a normal troponin level. CK-MB was slightly elevated at 3.7. She was cleared and patient did quite well and was ambulating initially bed  to chair and progressed up to ambulating 240 feet on the day before discharge.   CONDITION AT DISCHARGE: Stable.   DISPOSITION: Patient was sent home with home health physical therapy.   DISCHARGE INSTRUCTIONS:  1. [facility]  2. Patient will do partial weight-bear with a walker and she will use knee-high TED hose on both legs.  3. Patient will do regular diet.  4. Patient keep her dressing clean and dry and try not to get it wet.  5. Patient will call the clinic if there is any bright red bleeding, calf pain, bowel or bladder difficulty, or fever greater than 101.5.  6. The patient will work with physical therapy at home doing gait training with a walker as well as a bedside commode was ordered.   DISCHARGE MEDICATIONS:  1. Resume home medications. 2. Os-Cal vitamin D 500 mg 1 tablet with each meal. 3. Multivitamin once a day. 4. Tylenol [phone] mg q.6 hours as needed for pain.  5. Oxycodone 5 mg 1 tablet every 4 to 6 hours p.r.n. for pain. 6. Xarelto 10 mg daily for 28 days.   ____________________________ Shela CommonsJ. Dedra Skeensodd Elizette Shek, [place] jtm:cms D: 05/18/2011 07:46:05 ET T: 05/19/2011 12:28:39 ET  JOB#: [id number] cc: J. Dedra Skeensodd Jacquilyn Seldon, [place]PA, <Dictator> J [facility] G[facility] N BurdickMUNDY PA ELECTRONICALLY SIGNED 05/20/2011 7:23

## 2014-08-19 NOTE — Consult Note (Signed)
PATIENT NAME:  Marie GurneyMOORE, Zenola MR#:  [id number] DATE OF BIRTH:  [birth year]  DATE OF CONSULTATION:  05/15/2011  REFERRING PHYSICIAN:  Ruthann CancerJames Califf, MD CONSULTING PHYSICIAN:  Takiya Belmares A. Allena KatzPatel, MD  PRIMARY CARE PHYSICIAN: Hillery AldoSarah Rashae Rother, MD  REASON FOR CONSULTATION: Tachycardia. The patient is postoperative day one from left total hip arthroplasty.   HISTORY OF PRESENT ILLNESS: Ms. Marie ConstantMoore is a 69 year old pleasant African American female with past medical history of hypertension and history of asthma and gastroesophageal reflux disease who was admitted on 05/14/2011 and underwent an elective procedure with left hip total arthroplasty given severe degenerative joint disease. The patient is postoperative day one. She was found to be tachycardic postoperative with heart rate 116 to 130s. Currently her heart rate is around 100 to 113. The patient denies any symptoms of palpitations, chest pain, or shortness of breath. She feels fine. Her left hip pain is under control. She is currently on IV morphine PCA pump.   PAST MEDICAL HISTORY:  1. Asthma.  2. Gastroesophageal reflux disease.  3. Hypertension.  4. Arthritis/degenerative joint disease.  5. History of arrhythmia, she states her heart rate is always "fast". 6. Positive for MRSA in the past.  ALLERGIES: Penicillin, Prozac, and Wellbutrin.   HOME MEDICATIONS:  1. ProAir HFA two sprays as needed.  2. Pristiq 50 mg daily.  3. Nebulizers as needed.  4. Multivitamin p.o. daily.  5. Latanoprost 0.005% one drop to each affected eye at bedtime, mainly in the right eye.  6. Hydrochlorothiazide/triamterene 25/37.5 mg p.o. daily.  7. Albuterol 90 mcg/inhalation 2 puffs as needed.  8. Advair 250/50 one puff twice a day.   FAMILY HISTORY: Positive for hypertension.   REVIEW OF SYSTEMS: CONSTITUTIONAL: No fever, fatigue, or weakness. EYES: No blurred or double vision. ENT: No tinnitus, ear pain, or hearing loss. RESPIRATORY: No cough, wheeze, or hemoptysis.  CARDIOVASCULAR: No chest pain, orthopnea, or edema. GASTROINTESTINAL: No nausea, vomiting, diarrhea, or abdominal pain. GU: No dysuria or hematuria. ENDOCRINE: No polyuria or nocturia. HEMATOLOGY: No anemia or easy bruising. SKIN: No acne or rash. MUSCULOSKELETAL: Positive for arthritis. NEUROLOGIC: No cerebrovascular accident or transient ischemic attack. PSYCH: No anxiety or depression. All other systems are reviewed and negative.   PHYSICAL EXAMINATION:   GENERAL: The patient is awake, alert, and oriented x3, not in acute distress.  Morbidly obese.   VITAL SIGNS: Afebrile, pulse 116, blood pressure 111/67, and saturation 93% on 2 liters.   HEENT: Atraumatic, normocephalic. Pupils are equally round and reactive to light and accommodation. Extraocular movements intact. Oral mucosa is moist.  NECK: Supple. No JVD. No carotid bruit.   LUNGS: Clear to auscultation bilaterally. No rales, rhonchi, respiratory distress, or labored breathing.  CARDIOVASCULAR: Tachycardia present. No murmur heard. PMI is not lateralized. Chest is nontender.   EXTREMITIES: Good pedal pulses. Good femoral pulses. No lower extremity edema.   ABDOMEN: Soft, benign, and nontender. No organomegaly. Positive bowel sounds.   NEUROLOGIC: Cranial nerves II through XII grossly intact.  No major motor or sensory deficits.   PSYCH: The patient is awake, alert, and oriented x3.   LABS/STUDIES: EKG shows sinus tachycardia with right bundle branch block.   Hemoglobin 11.9, platelet count 225. Glucose 136, BUN 19, creatinine 1.02, sodium 139, potassium 4.7, chloride 101. Cardiac panel and TSH are pending.   Urinalysis is pending as well.   ASSESSMENT: 69 year old Ms. Marie Pham with:  1. Tachycardia: The patient is asymptomatic. Pain is well under control with use of morphine PCA pump. We  will need to rule out urinary tract infection. Urinalysis is pending. The patient does not seem to have any respiratory symptoms at present. We  will try a trial of low dose beta blockers. Check TSH. EKG shows sinus tachy with right bundle branch block. Cardiac enzymes are pending.  2. Left hip total arthroplasty postoperative day number one, PCA pump: The patient is to be started with physical therapy today, per orthopedics. She is currently on deep vein thrombosis prophylaxis with Xarelto.  3. Hypertension: On hydrochlorothiazide/triamterene. We will add low-dose beta blockers.  4. Obesity.   The above was discussed with the patient and the patient's husband. Thank you for the consultation. We will follow while the patient is in house along with you.   TIME SPENT: 45 minutes.  ____________________________ Wylie Hail Allena Katz, MD sap:slb D: 05/15/2011 10:38:51 ET     T: 05/15/2011 10:50:58 ET         JOB#: [id number] cc: Saath[place] Every A. Allena Katz, MD, <Dictator> Sarah "Sallie" Allena Katz, MD Willow Ora MD ELECTRONICALLY SIGNED 05/22/2011 7:29

## 2014-10-02 ENCOUNTER — Ambulatory Visit (INDEPENDENT_AMBULATORY_CARE_PROVIDER_SITE_OTHER): Payer: 59 | Admitting: Psychiatry

## 2014-10-02 ENCOUNTER — Encounter: Payer: Self-pay | Admitting: Psychiatry

## 2014-10-02 VITALS — BP 130/82 | HR 100 | Temp 98.1°F | Ht 65.0 in

## 2014-10-02 DIAGNOSIS — J45909 Unspecified asthma, uncomplicated: Secondary | ICD-10-CM | POA: Insufficient documentation

## 2014-10-02 DIAGNOSIS — J4540 Moderate persistent asthma, uncomplicated: Secondary | ICD-10-CM | POA: Diagnosis not present

## 2014-10-02 DIAGNOSIS — G4730 Sleep apnea, unspecified: Secondary | ICD-10-CM | POA: Diagnosis not present

## 2014-10-02 DIAGNOSIS — E669 Obesity, unspecified: Secondary | ICD-10-CM | POA: Diagnosis not present

## 2014-10-02 DIAGNOSIS — F331 Major depressive disorder, recurrent, moderate: Secondary | ICD-10-CM | POA: Diagnosis not present

## 2014-10-02 DIAGNOSIS — F411 Generalized anxiety disorder: Secondary | ICD-10-CM | POA: Diagnosis not present

## 2014-10-02 DIAGNOSIS — I10 Essential (primary) hypertension: Secondary | ICD-10-CM | POA: Diagnosis not present

## 2014-10-02 MED ORDER — VENLAFAXINE HCL ER 150 MG PO CP24: ORAL_CAPSULE | ORAL | Status: DC

## 2014-10-02 MED ORDER — QUETIAPINE FUMARATE 200 MG PO TABS: 200.0000 mg | ORAL_TABLET | Freq: Every day | ORAL | Status: DC

## 2014-10-02 NOTE — Progress Notes (Signed)
BH MD/PA/NP OP Progress Note  10/02/2014 3:18 PM Marie Pham  MRN:  [id number]  Subjective:  Patient is a 69 year old African-American female who presented for the follow-up appointment. She reported that she started developing rash after she was started on Wellbutrin and she is unable to tolerate the medication. She stated that she wants to continue on her venlafaxine and Seroquel as prescribed. Patient reported that her more symptoms are not getting worse at this time and she has some issues related to her family members and she is able to deal with them at this time. Patient currently denied having any suicidal ideations or plans. She reported that she spends most of the time in bed and get up to go to dinner for herself. She denied having any adverse effects of the medications.    Chief Complaint:  Chief Complaint    Depression; Anxiety     Visit Diagnosis:     ICD-9-CM ICD-10-CM   1. MDD (major depressive disorder), recurrent episode, moderate 296.32 F33.1   2. Anxiety state 300.00 F41.1   3. Asthma, chronic, moderate persistent, uncomplicated 493.90 J45.40   4. Essential hypertension 401.9 I10   5. Obesity 278.00 E66.9   6. Sleep apnea 780.57 G47.30     Past Medical History:  Past Medical History  Diagnosis Date  . Depression   . Hypertension   . Asthma   . Sleep apnea     Past Surgical History  Procedure Laterality Date  . Eye surgery Left   . Abdominal hysterectomy    . Hernia repair    . Colon surgery     Family History:  Family History  Problem Relation Age of Onset  . Hypertension Sister   . Alcohol abuse Brother   . Hypertension Brother   . Diabetes Brother    Social History:  History   Social History  . Marital Status: Married    Spouse Name: N/A  . Number of Children: N/A  . Years of Education: N/A   Social History Main Topics  . Smoking status: Former Smoker    Quit date: 10/02/1994  . Smokeless tobacco: Never Used  . Alcohol Use: No  . Drug  Use: No  . Sexual Activity: No   Other Topics Concern  . None   Social History Narrative  . None   Additional History: Lives by herself. She is being monitored by her grandson. She supports self SSI. She plays game on phone . She denied SI/HI or plans.   Assessment:   Musculoskeletal: Strength & Muscle Tone: within normal limits Gait & Station: normal Patient leans: N/A  Psychiatric Specialty Exam: HPI  Review of Systems  Constitutional: Positive for malaise/fatigue.  HENT: Negative for ear pain.   Eyes: Negative for discharge.  Respiratory: Negative for hemoptysis.   Cardiovascular: Positive for leg swelling.  Gastrointestinal: Negative for abdominal pain.  Musculoskeletal: Negative for back pain and neck pain.  Neurological: Negative for tremors.  Endo/Heme/Allergies: Negative for environmental allergies.  Psychiatric/Behavioral: Positive for depression. Negative for hallucinations. The patient is nervous/anxious.     Blood pressure 130/82, pulse 100, temperature 98.1 F (36.7 C), temperature source Tympanic, height  (1.651 m), SpO2 94 %.There is no weight on file to calculate BMI.  General Appearance: Casual  Eye Contact:  Fair  Speech:  Clear and Coherent  Volume:  Normal  Mood:  Depressed  Affect:  Congruent  Thought Process:  Coherent  Orientation:  Full (Time, Place, and Person)  Thought  Content:  WDL  Suicidal Thoughts:  No  Homicidal Thoughts:  No  Memory:  Immediate;   Fair  Judgement:  Fair  Insight:  Fair  Psychomotor Activity:  Decreased  Concentration:  Fair  Recall:  [employer]  Fund of Knowledge: Fair  Language: Fair  Akathisia:  No  Handed:  Right  AIMS (if indicated):  none  Assets:  Communication Skills Desire for Improvement  ADL's:  Intact  Cognition: WNL  Sleep:  8   Is the patient at risk to self?  No. Has the patient been a risk to self in the past 6 months?  No. Has the patient been a risk to self within the distant past?   No. Is the patient a risk to others?  No. Has the patient been a risk to others in the past 6 months?  No. Has the patient been a risk to others within the distant past?  No.  Current Medications: Current Outpatient Prescriptions  Medication Sig Dispense Refill  . albuterol (PROVENTIL HFA;VENTOLIN HFA) 108 (90 BASE) MCG/ACT inhaler Inhale into the lungs.    . Fluticasone-Salmeterol (ADVAIR) 250-50 MCG/DOSE AEPB Inhale into the lungs.    . hydrochlorothiazide (HYDRODIURIL) 12.5 MG tablet Take by mouth.    Marland Kitchen. lisinopril (PRINIVIL,ZESTRIL) 10 MG tablet Take by mouth.    . losartan (COZAAR) 50 MG tablet Take by mouth.     No current facility-administered medications for this visit.    Medical Decision Making:  Established Problem, Stable/Improving (1)  Treatment Plan Summary:Medication management  Discussed with patient about the medications and I will continue her on venlafaxine 150 mg in the morning and Seroquel 200 mg at bedtime Discussed with her about the adverse effects of the medication and she demonstrarted understanding.     SSRI Discussed with pt about the [facility] warnings, increased risk of suicidal thinking when starting the medications.  GI side effects, sexual side effects, increase in manic or hypomanic symptoms as well as the discontinuation syndromes.Advised about withdrawal symptoms if stopped immediately. Pt demonstrated understanding.     Atypicals: The patient was counseled on the risks, benefits, and alternatives to treatment with an atypical antipsychotic agent.  Risks discussed include metabolic side effects: weight gain, elevations in blood sugar and lipids, and increased risk of diabetes.  The patient was also advised of the risks of dystonia, akathisia, parkinsonism, tardive dyskinesia, and prolactin elevation.  The patient was able to demonstrate understanding of these risks and provided informed consent to start the medication.     More than 50% of the  time spent in psychoeducation, counseling and coordination of care.    This note was generated in part or whole with voice recognition software. Voice regonition is usually quite accurate but there are transcription errors that can and very often do occur. I apologize for any typographical errors that were not detected and corrected.   Brandy HaleUzma Attikus Bartoszek 10/02/2014, 3:18 PM

## 2014-11-08 ENCOUNTER — Other Ambulatory Visit: Payer: Self-pay | Admitting: Family Medicine

## 2014-11-08 DIAGNOSIS — Z1231 Encounter for screening mammogram for malignant neoplasm of breast: Secondary | ICD-10-CM

## 2014-11-15 ENCOUNTER — Ambulatory Visit: Payer: Medicaid Other | Attending: Family Medicine

## 2014-12-28 ENCOUNTER — Other Ambulatory Visit: Payer: Self-pay

## 2014-12-28 NOTE — Telephone Encounter (Signed)
pt was r/s from 01-01-15 due to dr. Garnetta Buddy work restrictions to 01-29-15 pt states she will need all of medications refill.

## 2015-01-01 ENCOUNTER — Ambulatory Visit: Payer: Self-pay | Admitting: Psychiatry

## 2015-01-01 MED ORDER — VENLAFAXINE HCL ER 150 MG PO CP24: ORAL_CAPSULE | ORAL | Status: DC

## 2015-01-01 MED ORDER — QUETIAPINE FUMARATE 200 MG PO TABS: 200.0000 mg | ORAL_TABLET | Freq: Every day | ORAL | Status: DC

## 2015-01-10 ENCOUNTER — Other Ambulatory Visit: Payer: Self-pay | Admitting: Specialist

## 2015-01-10 DIAGNOSIS — R911 Solitary pulmonary nodule: Secondary | ICD-10-CM

## 2015-01-17 ENCOUNTER — Ambulatory Visit: Admission: RE | Admit: 2015-01-17 | Payer: Medicaid Other | Source: Ambulatory Visit

## 2015-01-22 ENCOUNTER — Ambulatory Visit: Payer: Medicaid Other | Attending: Specialist

## 2015-01-29 ENCOUNTER — Ambulatory Visit: Payer: 59 | Admitting: Psychiatry

## 2015-01-29 ENCOUNTER — Ambulatory Visit: Payer: Medicare Other | Attending: Specialist

## 2015-02-05 ENCOUNTER — Ambulatory Visit
Admission: RE | Admit: 2015-02-05 | Discharge: 2015-02-05 | Disposition: A | Discharge status: Home / Self Care | Payer: Medicare Other | Source: Ambulatory Visit | Attending: Specialist | Admitting: Specialist

## 2015-02-05 DIAGNOSIS — J439 Emphysema, unspecified: Secondary | ICD-10-CM | POA: Diagnosis not present

## 2015-02-05 DIAGNOSIS — R05 Cough: Secondary | ICD-10-CM | POA: Insufficient documentation

## 2015-02-05 DIAGNOSIS — R911 Solitary pulmonary nodule: Secondary | ICD-10-CM

## 2015-02-05 DIAGNOSIS — R918 Other nonspecific abnormal finding of lung field: Secondary | ICD-10-CM | POA: Diagnosis not present

## 2015-02-05 DIAGNOSIS — R0609 Other forms of dyspnea: Secondary | ICD-10-CM | POA: Diagnosis present

## 2015-04-07 ENCOUNTER — Emergency Department
Admission: EM | Admit: 2015-04-07 | Discharge: 2015-04-07 | Disposition: A | Discharge status: Home / Self Care | Payer: Medicare Other | Attending: Emergency Medicine | Admitting: Emergency Medicine

## 2015-04-07 ENCOUNTER — Encounter: Payer: Self-pay | Admitting: Emergency Medicine

## 2015-04-07 ENCOUNTER — Emergency Department: Payer: Medicare Other

## 2015-04-07 DIAGNOSIS — I10 Essential (primary) hypertension: Secondary | ICD-10-CM | POA: Diagnosis not present

## 2015-04-07 DIAGNOSIS — Z79899 Other long term (current) drug therapy: Secondary | ICD-10-CM | POA: Diagnosis not present

## 2015-04-07 DIAGNOSIS — Z87891 Personal history of nicotine dependence: Secondary | ICD-10-CM | POA: Insufficient documentation

## 2015-04-07 DIAGNOSIS — Z880 Allergy status to penicillin: Secondary | ICD-10-CM | POA: Insufficient documentation

## 2015-04-07 DIAGNOSIS — J45901 Unspecified asthma with (acute) exacerbation: Secondary | ICD-10-CM | POA: Insufficient documentation

## 2015-04-07 DIAGNOSIS — Z7951 Long term (current) use of inhaled steroids: Secondary | ICD-10-CM | POA: Diagnosis not present

## 2015-04-07 DIAGNOSIS — R05 Cough: Secondary | ICD-10-CM | POA: Diagnosis present

## 2015-04-07 LAB — BRAIN NATRIURETIC PEPTIDE: B NATRIURETIC PEPTIDE 5: 22 pg/mL (ref 0.0–100.0)

## 2015-04-07 LAB — CBC WITH DIFFERENTIAL/PLATELET
BASOS PCT: 1 %
Basophils Absolute: 0.1 10*3/uL (ref 0–0.1)
Eosinophils Absolute: 0.2 10*3/uL (ref 0–0.7)
Eosinophils Relative: 2 %
HEMATOCRIT: 38 % (ref 35.0–47.0)
Hemoglobin: 12.6 g/dL (ref 12.0–16.0)
Lymphocytes Relative: 30 %
Lymphs Abs: 3.1 10*3/uL (ref 1.0–3.6)
MCH: 28.5 pg (ref 26.0–34.0)
MCHC: 33.2 g/dL (ref 32.0–36.0)
MCV: 85.7 fL (ref 80.0–100.0)
MONO ABS: 1.2 10*3/uL — AB (ref 0.2–0.9)
MONOS PCT: 11 %
NEUTROS ABS: 5.7 10*3/uL (ref 1.4–6.5)
Neutrophils Relative %: 56 %
Platelets: 259 10*3/uL (ref 150–440)
RBC: 4.43 MIL/uL (ref 3.80–5.20)
RDW: 13.7 % (ref 11.5–14.5)
WBC: 10.2 10*3/uL (ref 3.6–11.0)

## 2015-04-07 LAB — BASIC METABOLIC PANEL
Anion gap: 6 (ref 5–15)
BUN: 18 mg/dL (ref 6–20)
CHLORIDE: 104 mmol/L (ref 101–111)
CO2: 30 mmol/L (ref 22–32)
CREATININE: 1 mg/dL (ref 0.44–1.00)
Calcium: 8.9 mg/dL (ref 8.9–10.3)
GFR calc non Af Amer: 57 mL/min — ABNORMAL LOW (ref >60)
Glucose, Bld: 90 mg/dL (ref 65–99)
Potassium: 3.7 mmol/L (ref 3.5–5.1)
Sodium: 140 mmol/L (ref 135–145)

## 2015-04-07 LAB — MAGNESIUM: MAGNESIUM: 2.2 mg/dL (ref 1.7–2.4)

## 2015-04-07 LAB — TROPONIN I

## 2015-04-07 MED ORDER — PREDNISONE 20 MG PO TABS
60.0000 mg | ORAL_TABLET | Freq: Once | ORAL | Status: AC
  Administered 2015-04-07: 60 mg via ORAL
  Filled 2015-04-07: qty 3

## 2015-04-07 MED ORDER — IPRATROPIUM-ALBUTEROL 0.5-2.5 (3) MG/3ML IN SOLN
3.0000 mL | Freq: Once | RESPIRATORY_TRACT | Status: AC
  Administered 2015-04-07: 3 mL via RESPIRATORY_TRACT
  Filled 2015-04-07: qty 3

## 2015-04-07 NOTE — ED Provider Notes (Signed)
Time Seen: Approximately ----------------------------------------- 6:27 PM on 04/07/2015 -----------------------------------------   I have reviewed the triage notes  Chief Complaint: Asthma   History of Present Illness: Marie Pham is a 69 y.o. female who has a long history of asthma but no recent admissions. She is followed by a pulmonologist who has been investigating her cough and she just had a normal CAT scan of the chest performed in October. She states she's been having some recent upper respiratory symptoms with postnasal drip and some increased cough. Patient states that she was at 2 separate church services today noticed increased shortness of breath at the end of the second service. EMS transported the patient uneventfully and room air sats are 98%. Persistent nonproductive cough. She denies any chest pain. She denies any nausea, vomiting, fever. She states she has a prescription for prednisone  waiting on her at the pharmacy.   Past Medical History  Diagnosis Date  . Depression   . Hypertension   . Asthma   . Sleep apnea     Patient Active Problem List   Diagnosis Date Noted  . BP (high blood pressure) 10/02/2014  . Asthma, chronic 10/02/2014  . Essential hypertension 10/02/2014  . Obesity 10/02/2014  . Sleep apnea 10/02/2014    Past Surgical History  Procedure Laterality Date  . Eye surgery Left   . Abdominal hysterectomy    . Hernia repair    . Colon surgery      Past Surgical History  Procedure Laterality Date  . Eye surgery Left   . Abdominal hysterectomy    . Hernia repair    . Colon surgery      Current Outpatient Rx  Name  Route  Sig  Dispense  Refill  . albuterol (PROVENTIL HFA;VENTOLIN HFA) 108 (90 BASE) MCG/ACT inhaler   Inhalation   Inhale into the lungs.         . Fluticasone-Salmeterol (ADVAIR) 250-50 MCG/DOSE AEPB   Inhalation   Inhale into the lungs.         . hydrochlorothiazide (HYDRODIURIL) 12.5 MG tablet   Oral   Take  by mouth.         Marland Kitchen. lisinopril (PRINIVIL,ZESTRIL) 10 MG tablet   Oral   Take by mouth.         . losartan (COZAAR) 50 MG tablet   Oral   Take by mouth.         . QUEtiapine (SEROQUEL) 200 MG tablet   Oral   Take 1 tablet (200 mg total) by mouth at bedtime.   30 tablet   1   . venlafaxine XR (EFFEXOR-XR) 150 MG 24 hr capsule      i pill in am   30 capsule   1     Allergies:  Lansoprazole; Penicillins; and Bupropion  Family History: Family History  Problem Relation Age of Onset  . Hypertension Sister   . Alcohol abuse Brother   . Hypertension Brother   . Diabetes Brother     Social History: Social History  Substance Use Topics  . Smoking status: Former Smoker    Quit date: 10/02/1994  . Smokeless tobacco: Never Used  . Alcohol Use: No     Review of Systems:   10 point review of systems was performed and was otherwise negative:  Constitutional: No fever Eyes: No visual disturbances ENT: No sore throat, ear pain Cardiac: No chest pain Respiratory: No shortness of breath, wheezing, or stridor Abdomen: No abdominal pain, no  vomiting, No diarrhea Endocrine: No weight loss, No night sweats Extremities: No peripheral edema, cyanosis Skin: No rashes, easy bruising Neurologic: No focal weakness, trouble with speech or swollowing Urologic: No dysuria, Hematuria, or urinary frequency   Physical Exam:  ED Triage Vitals  Enc Vitals Group     BP 04/07/15 1715 118/94 mmHg     Pulse Rate 04/07/15 1715 117     Resp 04/07/15 1715 20     Temp 04/07/15 1715 98.3 F (36.8 C)     Temp Source 04/07/15 1715 Oral     SpO2 04/07/15 1715 93 %     Weight 04/07/15 1715 217 lb 14.4 oz (98.839 kg)     Height 04/07/15 1715  (1.651 m)     Head Cir --      Peak Flow --      Pain Score --      Pain Loc --      Pain Edu? --      Excl. in GC? --     General: Awake , Alert , and Oriented times 3; GCS 15 Head: Normal cephalic , atraumatic Eyes: Pupils equal ,  round, reactive to light Nose/Throat: No nasal drainage, patent upper airway without erythema or exudate.  Neck: Supple, Full range of motion, No anterior adenopathy or palpable thyroid masses Lungs: Mild and expiratory wheezing without  , rhonchi, or rales Heart: Regular rate, regular rhythm without murmurs , gallops , or rubs Abdomen: Soft, non tender without rebound, guarding , or rigidity; bowel sounds positive and symmetric in all 4 quadrants. No organomegaly .        Extremities: 2 plus symmetric pulses. No edema, clubbing or cyanosis Neurologic: normal ambulation, Motor symmetric without deficits, sensory intact Skin: warm, dry, no rashes   Labs:   All laboratory work was reviewed including any pertinent negatives or positives listed below:  Labs Reviewed  CBC WITH DIFFERENTIAL/PLATELET  BASIC METABOLIC PANEL  MAGNESIUM  TROPONIN I  BRAIN NATRIURETIC PEPTIDE    EKG:   ED ECG REPORT I, Jennye Moccasin, the attending physician, personally viewed and interpreted this ECG.  Date: 04/07/2015 EKG Time: 1738 Rate: 108 Rhythm: normal sinus rhythm QRS Axis: normal Intervals: Right bundle-branch block ST/T Wave abnormalities: normal Conduction Disutrbances: none Narrative Interpretation: unremarkable No obvious acute ischemic changes No significant change from 03-27-2014   Radiology:     CLINICAL DATA: Recent asthma attack  EXAM: CHEST - 2 VIEW  COMPARISON: 02/05/2015  FINDINGS: The heart size and mediastinal contours are within normal limits. Both lungs are well aerated with minimal left basilar scarring. No focal infiltrate is seen. The visualized skeletal structures are unremarkable.  IMPRESSION: No active disease. I personally reviewed the radiologic studies     ED Course: * Patient's stay here was uneventful and she had clinical improvement after being administered 60 mg of prednisone by mouth along with a DuoNeb. Patient states her symptoms feel  much better and she feels well enough to go home. Repeat exam still shows no extensive wheezing at this time and there's no persistent coughing while was in the room explaining her results. Last touch base with her pulmonologist for further outpatient management. Return here if she develops a productive cough, fever, chest pain or any other new concerns. Her BNP was negative and I felt given the negative chest x-ray was unlikely to be pulmonary edema. She had a negative chest CT just done in October and has had similar symptoms before and I  felt this was unlikely to be a pulmonary embolism.    Assessment: Acute exacerbation of chronic asthma     Plan:  Outpatient management Patient was advised to return immediately if condition worsens. Patient was advised to follow up with their primary care physician or other specialized physicians involved in their outpatient care             Jennye Moccasin, MD 04/07/15 [phone]

## 2015-04-07 NOTE — ED Notes (Signed)
Pt presents to ER from church with asthma flare due to temperature changes. Pt took 1 puff of albuterol at church with only  mild relief. No audible wheezing at present time. SpO2 98% RA. Non productive cough present.

## 2015-04-07 NOTE — Discharge Instructions (Signed)
Asthma, Adult Asthma is a recurring condition in which the airways tighten and narrow. Asthma can make it difficult to breathe. It can cause coughing, wheezing, and shortness of breath. Asthma episodes, also called asthma attacks, range from minor to life-threatening. Asthma cannot be cured, but medicines and lifestyle changes can help control it. CAUSES Asthma is believed to be caused by inherited (genetic) and environmental factors, but its exact cause is unknown. Asthma may be triggered by allergens, lung infections, or irritants in the air. Asthma triggers are different for each person. Common triggers include:   Animal dander.  Dust mites.  Cockroaches.  Pollen from trees or grass.  Mold.  Smoke.  Air pollutants such as dust, household cleaners, hair sprays, aerosol sprays, paint fumes, strong chemicals, or strong odors.  Cold air, weather changes, and winds (which increase molds and pollens in the air).  Strong emotional expressions such as crying or laughing hard.  Stress.  Certain medicines (such as aspirin) or types of drugs (such as beta-blockers).  Sulfites in foods and drinks. Foods and drinks that may contain sulfites include dried fruit, potato chips, and sparkling grape juice.  Infections or inflammatory conditions such as the flu, a cold, or an inflammation of the nasal membranes (rhinitis).  Gastroesophageal reflux disease (GERD).  Exercise or strenuous activity. SYMPTOMS Symptoms may occur immediately after asthma is triggered or many hours later. Symptoms include:  Wheezing.  Excessive nighttime or early morning coughing.  Frequent or severe coughing with a common cold.  Chest tightness.  Shortness of breath. DIAGNOSIS  The diagnosis of asthma is made by a review of your medical history and a physical exam. Tests may also be performed. These may include:  Lung function studies. These tests show how much air you breathe in and out.  Allergy  tests.  Imaging tests such as X-rays. TREATMENT  Asthma cannot be cured, but it can usually be controlled. Treatment involves identifying and avoiding your asthma triggers. It also involves medicines. There are 2 classes of medicine used for asthma treatment:   Controller medicines. These prevent asthma symptoms from occurring. They are usually taken every day.  Reliever or rescue medicines. These quickly relieve asthma symptoms. They are used as needed and provide short-term relief. Your health care provider will help you create an asthma action plan. An asthma action plan is a written plan for managing and treating your asthma attacks. It includes a list of your asthma triggers and how they may be avoided. It also includes information on when medicines should be taken and when their dosage should be changed. An action plan may also involve the use of a device called a peak flow meter. A peak flow meter measures how well the lungs are working. It helps you monitor your condition. HOME CARE INSTRUCTIONS   Take medicines only as directed by your health care provider. Speak with your health care provider if you have questions about how or when to take the medicines.  Use a peak flow meter as directed by your health care provider. Record and keep track of readings.  Understand and use the action plan to help minimize or stop an asthma attack without needing to seek medical care.  Control your home environment in the following ways to help prevent asthma attacks:  Do not smoke. Avoid being exposed to secondhand smoke.  Change your heating and air conditioning filter regularly.  Limit your use of fireplaces and wood stoves.  Get rid of pests (such as roaches  and mice) and their droppings.  Throw away plants if you see mold on them.  Clean your floors and dust regularly. Use unscented cleaning products.  Try to have someone else vacuum for you regularly. Stay out of rooms while they are  being vacuumed and for a short while afterward. If you vacuum, use a dust mask from a hardware store, a double-layered or microfilter vacuum cleaner bag, or a vacuum cleaner with a HEPA filter.  Replace carpet with wood, tile, or vinyl flooring. Carpet can trap dander and dust.  Use allergy-proof pillows, mattress covers, and box spring covers.  Wash bed sheets and blankets every week in hot water and dry them in a dryer.  Use blankets that are made of polyester or cotton.  Clean bathrooms and kitchens with bleach. If possible, have someone repaint the walls in these rooms with mold-resistant paint. Keep out of the rooms that are being cleaned and painted.  Wash hands frequently. SEEK MEDICAL CARE IF:   You have wheezing, shortness of breath, or a cough even if taking medicine to prevent attacks.  The colored mucus you cough up (sputum) is thicker than usual.  Your sputum changes from clear or white to yellow, green, gray, or bloody.  You have any problems that may be related to the medicines you are taking (such as a rash, itching, swelling, or trouble breathing).  You are using a reliever medicine more than 2-3 times per week.  Your peak flow is still at 50-79% of your personal best after following your action plan for 1 hour.  You have a fever. SEEK IMMEDIATE MEDICAL CARE IF:   You seem to be getting worse and are unresponsive to treatment during an asthma attack.  You are short of breath even at rest.  You get short of breath when doing very little physical activity.  You have difficulty eating, drinking, or talking due to asthma symptoms.  You develop chest pain.  You develop a fast heartbeat.  You have a bluish color to your lips or fingernails.  You are light-headed, dizzy, or faint.  Your peak flow is less than 50% of your personal best.   This information is not intended to replace advice given to you by your health care provider. Make sure you discuss any  questions you have with your health care provider.   Document Released: 04/13/2005 Document Revised: 01/02/2015 Document Reviewed: 11/10/2012 Elsevier Interactive Patient Education 2016 [employer]  Please start her prednisone prescription tomorrow. Please drink plenty of fluids and continue with your inhalers or nebulizers at home. Please return immediately if condition worsens. Please contact her primary physician or the physician you were given for referral. If you have any specialist physicians involved in her treatment and plan please also contact them. Thank you for using [place] [facility].

## 2015-08-29 ENCOUNTER — Ambulatory Visit: Payer: Medicare Other | Attending: Family Medicine

## 2016-01-12 ENCOUNTER — Encounter: Payer: Self-pay | Admitting: Emergency Medicine

## 2016-01-12 ENCOUNTER — Emergency Department: Payer: Medicare Other

## 2016-01-12 ENCOUNTER — Emergency Department
Admission: EM | Admit: 2016-01-12 | Discharge: 2016-01-12 | Disposition: A | Discharge status: Home / Self Care | Payer: Medicare Other | Attending: Student in an Organized Health Care Education/Training Program | Admitting: Student in an Organized Health Care Education/Training Program

## 2016-01-12 DIAGNOSIS — Z79899 Other long term (current) drug therapy: Secondary | ICD-10-CM | POA: Insufficient documentation

## 2016-01-12 DIAGNOSIS — J441 Chronic obstructive pulmonary disease with (acute) exacerbation: Secondary | ICD-10-CM | POA: Diagnosis not present

## 2016-01-12 DIAGNOSIS — I10 Essential (primary) hypertension: Secondary | ICD-10-CM | POA: Insufficient documentation

## 2016-01-12 DIAGNOSIS — J45909 Unspecified asthma, uncomplicated: Secondary | ICD-10-CM | POA: Diagnosis not present

## 2016-01-12 DIAGNOSIS — Z87891 Personal history of nicotine dependence: Secondary | ICD-10-CM | POA: Insufficient documentation

## 2016-01-12 DIAGNOSIS — R0602 Shortness of breath: Secondary | ICD-10-CM | POA: Diagnosis present

## 2016-01-12 LAB — COMPREHENSIVE METABOLIC PANEL
ALT: 15 U/L (ref 14–54)
AST: 21 U/L (ref 15–41)
Albumin: 3.9 g/dL (ref 3.5–5.0)
Alkaline Phosphatase: 92 U/L (ref 38–126)
Anion gap: 8 (ref 5–15)
BUN: 19 mg/dL (ref 6–20)
CHLORIDE: 110 mmol/L (ref 101–111)
CO2: 25 mmol/L (ref 22–32)
CREATININE: 0.71 mg/dL (ref 0.44–1.00)
Calcium: 9 mg/dL (ref 8.9–10.3)
Glucose, Bld: 113 mg/dL — ABNORMAL HIGH (ref 65–99)
POTASSIUM: 3.9 mmol/L (ref 3.5–5.1)
SODIUM: 143 mmol/L (ref 135–145)
Total Bilirubin: 0.4 mg/dL (ref 0.3–1.2)
Total Protein: 7.4 g/dL (ref 6.5–8.1)

## 2016-01-12 LAB — CBC WITH DIFFERENTIAL/PLATELET
BASOS ABS: 0.1 10*3/uL (ref 0–0.1)
Basophils Relative: 1 %
EOS ABS: 0.1 10*3/uL (ref 0–0.7)
EOS PCT: 1 %
HCT: 39.6 % (ref 35.0–47.0)
Hemoglobin: 13.7 g/dL (ref 12.0–16.0)
Lymphocytes Relative: 47 %
Lymphs Abs: 5.6 10*3/uL — ABNORMAL HIGH (ref 1.0–3.6)
MCH: 29.1 pg (ref 26.0–34.0)
MCHC: 34.6 g/dL (ref 32.0–36.0)
MCV: 84.3 fL (ref 80.0–100.0)
MONO ABS: 1.2 10*3/uL — AB (ref 0.2–0.9)
Monocytes Relative: 10 %
Neutro Abs: 4.8 10*3/uL (ref 1.4–6.5)
Neutrophils Relative %: 41 %
PLATELETS: 255 10*3/uL (ref 150–440)
RBC: 4.71 MIL/uL (ref 3.80–5.20)
RDW: 13.5 % (ref 11.5–14.5)
WBC: 11.8 10*3/uL — AB (ref 3.6–11.0)

## 2016-01-12 LAB — TROPONIN I: Troponin I: <0.03 ng/mL (ref <0.03)

## 2016-01-12 LAB — FIBRIN DERIVATIVES D-DIMER (ARMC ONLY): Fibrin derivatives D-dimer (ARMC): 669 — ABNORMAL HIGH (ref 0–499)

## 2016-01-12 LAB — BRAIN NATRIURETIC PEPTIDE: B Natriuretic Peptide: 33 pg/mL (ref 0.0–100.0)

## 2016-01-12 LAB — MAGNESIUM: MAGNESIUM: 2.2 mg/dL (ref 1.7–2.4)

## 2016-01-12 MED ORDER — ALBUTEROL SULFATE (2.5 MG/3ML) 0.083% IN NEBU
5.0000 mg | INHALATION_SOLUTION | Freq: Once | RESPIRATORY_TRACT | Status: AC
  Administered 2016-01-12: 5 mg via RESPIRATORY_TRACT
  Filled 2016-01-12: qty 6

## 2016-01-12 MED ORDER — PREDNISONE 20 MG PO TABS: 40.0000 mg | ORAL_TABLET | Freq: Every day | ORAL | 0 refills | Status: AC

## 2016-01-12 MED ORDER — SODIUM CHLORIDE 0.9 % IV BOLUS (SEPSIS)
500.0000 mL | Freq: Once | INTRAVENOUS | Status: AC
  Administered 2016-01-12: 500 mL via INTRAVENOUS

## 2016-01-12 MED ORDER — DOXYCYCLINE HYCLATE 50 MG PO CAPS: 100.0000 mg | ORAL_CAPSULE | Freq: Two times a day (BID) | ORAL | 0 refills | Status: AC

## 2016-01-12 MED ORDER — IOPAMIDOL (ISOVUE-370) INJECTION 76%
75.0000 mL | Freq: Once | INTRAVENOUS | Status: AC | PRN
  Administered 2016-01-12: 75 mL via INTRAVENOUS

## 2016-01-12 MED ORDER — DOXYCYCLINE HYCLATE 100 MG PO TABS
100.0000 mg | ORAL_TABLET | Freq: Once | ORAL | Status: AC
  Administered 2016-01-12: 100 mg via ORAL
  Filled 2016-01-12: qty 1

## 2016-01-12 MED ORDER — IPRATROPIUM-ALBUTEROL 0.5-2.5 (3) MG/3ML IN SOLN: 3.0000 mL | Freq: Once | RESPIRATORY_TRACT | Status: DC

## 2016-01-12 NOTE — ED Triage Notes (Signed)
States for a year she has been having trouble with her copd in which she coughs until "she passes out". Did not pass out today. Sees her pcp for this problem

## 2016-01-12 NOTE — ED Provider Notes (Signed)
[facility] Emergency Department Provider Note    First MD Initiated Contact with Patient 01/12/16 1703     (approximate)  I have reviewed the triage vital signs and the nursing notes.   HISTORY  Chief Complaint Shortness of Breath    HPI Marie Pham is a 70 y.o. female with history of asthma and sleep apnea presents with 1 day of worsening cough and shortness of breath. Patient states that she was at church and the room was very hot, which she describes as one of her major triggers for asthma exacerbations. States that she had started having a coughing fit where she was hyperventilating and then fainted. She denies any chest pain. Does feel short of breath right now. Denies any lower extremity edema. No history of blood clots previously.She does feel better after the albuterol nebulizer and IV steroids provided by EMS. She does not wear home oxygen.   Past Medical History:  Diagnosis Date  . Asthma   . Depression   . Hypertension   . Sleep apnea     Patient Active Problem List   Diagnosis Date Noted  . BP (high blood pressure) 10/02/2014  . Asthma, chronic 10/02/2014  . Essential hypertension 10/02/2014  . Obesity 10/02/2014  . Sleep apnea 10/02/2014    Past Surgical History:  Procedure Laterality Date  . ABDOMINAL HYSTERECTOMY    . COLON SURGERY    . EYE SURGERY Left   . HERNIA REPAIR      Prior to Admission medications   Medication Sig Start Date End Date Taking? Authorizing Provider  albuterol (PROVENTIL HFA;VENTOLIN HFA) 108 (90 BASE) MCG/ACT inhaler Inhale into the lungs.    Historical Provider, MD  Fluticasone-Salmeterol (ADVAIR) 250-50 MCG/DOSE AEPB Inhale into the lungs.    Historical Provider, MD  hydrochlorothiazide (HYDRODIURIL) 12.5 MG tablet Take by mouth. 05/29/14 05/29/15  Historical Provider, MD  lisinopril (PRINIVIL,ZESTRIL) 10 MG tablet Take by mouth.    Historical Provider, MD  losartan (COZAAR) 50 MG tablet Take by  mouth. 05/01/14 01/12/16  Historical Provider, MD  QUEtiapine (SEROQUEL) 200 MG tablet Take 1 tablet (200 mg total) by mouth at bedtime. 01/01/15   Brandy Hale, MD  venlafaxine XR (EFFEXOR-XR) 150 MG 24 hr capsule i pill in am 01/01/15   Brandy Hale, MD    Allergies Lansoprazole; Penicillins; and Bupropion  Family History  Problem Relation Age of Onset  . Hypertension Sister   . Alcohol abuse Brother   . Hypertension Brother   . Diabetes Brother     Social History Social History  Substance Use Topics  . Smoking status: Former Smoker    Quit date: 10/02/1994  . Smokeless tobacco: Never Used  . Alcohol use No    Review of Systems Patient denies headaches, rhinorrhea, blurry vision, numbness, shortness of breath, chest pain, edema, cough, abdominal pain, nausea, vomiting, diarrhea, dysuria, fevers, rashes or hallucinations unless otherwise stated above in HPI. ____________________________________________   PHYSICAL EXAM:  VITAL SIGNS: Vitals:   01/12/16 1704 01/12/16 1707  Pulse: (!) 114   Temp:  98 F (36.7 C)    Constitutional: Alert and oriented. Well appearing and in no acute distress. Eyes: Conjunctivae are normal. PERRL. EOMI. Head: Atraumatic. Nose: No congestion/rhinnorhea. Mouth/Throat: Mucous membranes are moist.  Oropharynx non-erythematous. Neck: No stridor. Painless ROM. No cervical spine tenderness to palpation Hematological/Lymphatic/Immunilogical: No cervical lymphadenopathy. Cardiovascular: Normal rate, regular rhythm. Grossly normal heart sounds.  Good peripheral circulation. Respiratory: Normal respiratory effort.  No retractions.  Lungs CTAB. Gastrointestinal: Soft and nontender. No distention. No abdominal bruits. No CVA tenderness. Genitourinary:  Musculoskeletal: No lower extremity tenderness nor edema.  No joint effusions. Neurologic:  Normal speech and language. No gross focal neurologic deficits are appreciated. No gait instability. Skin:  Skin is  warm, dry and intact. No rash noted. Psychiatric: Mood and affect are normal. Speech and behavior are normal.  ____________________________________________   LABS (all labs ordered are listed, but only abnormal results are displayed)  Results for orders placed or performed during the hospital encounter of 01/12/16 (from the past 24 hour(s))  CBC with Differential/Platelet     Status: Abnormal   Collection Time: 01/12/16  5:14 PM  Result Value Ref Range   WBC 11.8 (H) 3.6 - 11.0 K/uL   RBC 4.71 3.80 - 5.20 MIL/uL   Hemoglobin 13.7 12.0 - 16.0 g/dL   HCT 24.439.6 01.035.0 - 27.247.0 %   MCV 84.3 80.0 - 100.0 fL   MCH 29.1 26.0 - 34.0 pg   MCHC 34.6 32.0 - 36.0 g/dL   RDW 53.613.5 64.411.5 - 03.414.5 %   Platelets 255 150 - 440 K/uL   Neutrophils Relative % 41 %   Neutro Abs 4.8 1.4 - 6.5 K/uL   Lymphocytes Relative 47 %   Lymphs Abs 5.6 (H) 1.0 - 3.6 K/uL   Monocytes Relative 10 %   Monocytes Absolute 1.2 (H) 0.2 - 0.9 K/uL   Eosinophils Relative 1 %   Eosinophils Absolute 0.1 0 - 0.7 K/uL   Basophils Relative 1 %   Basophils Absolute 0.1 0 - 0.1 K/uL  Comprehensive metabolic panel     Status: Abnormal   Collection Time: 01/12/16  5:14 PM  Result Value Ref Range   Sodium 143 135 - 145 mmol/L   Potassium 3.9 3.5 - 5.1 mmol/L   Chloride 110 101 - 111 mmol/L   CO2 25 22 - 32 mmol/L   Glucose, Bld 113 (H) 65 - 99 mg/dL   BUN 19 6 - 20 mg/dL   Creatinine, Ser 7.420.71 0.44 - 1.00 mg/dL   Calcium 9.0 8.9 - 59.510.3 mg/dL   Total Protein 7.4 6.5 - 8.1 g/dL   Albumin 3.9 3.5 - 5.0 g/dL   AST 21 15 - 41 U/L   ALT 15 14 - 54 U/L   Alkaline Phosphatase 92 38 - 126 U/L   Total Bilirubin 0.4 0.3 - 1.2 mg/dL   GFR calc non Af Amer >60 >60 mL/min   GFR calc Af Amer >60 >60 mL/min   Anion gap 8 5 - 15  Troponin I     Status: None   Collection Time: 01/12/16  5:14 PM  Result Value Ref Range   Troponin I <0.03 <0.03 ng/mL  Magnesium     Status: None   Collection Time: 01/12/16  5:14 PM  Result Value Ref Range     Magnesium 2.2 1.7 - 2.4 mg/dL  Brain natriuretic peptide     Status: None   Collection Time: 01/12/16  5:14 PM  Result Value Ref Range   B Natriuretic Peptide 33.0 0.0 - 100.0 pg/mL  Fibrin derivatives D-Dimer ([facility] only)     Status: Abnormal   Collection Time: 01/12/16  5:14 PM  Result Value Ref Range   Fibrin derivatives D-dimer ([facility]) 669 (H) 0 - 499   ____________________________________________  EKG My review and personal interpretation at Time: 17:12   Indication: sob  Rate: 115  Rhythm: sinus Axis: normal Other: RBBB, no acute  changes, consistent with previous EKG 04/07/15 ____________________________________________  RADIOLOGY CXR my read shows no evidence of acute cardiopulmonary process.  CTA chest without evidence of Pe.  Discussed lung nodules with patient/ ____________________________________________   PROCEDURES  Procedure(s) performed: none    Critical Care performed: no ____________________________________________   INITIAL IMPRESSION / ASSESSMENT AND PLAN / ED COURSE  Pertinent labs & imaging results that were available during my care of the patient were reviewed by me and considered in my medical decision making (see chart for details).  DDX: Asthma, copd, CHF, pna, ptx, malignancy, Pe, anemia   Marie Pham is a 70 y.o. who presents to the ED with Acute shortness of breath and cough. Denies any chest pain or pressure. Does have a history of COPD and exam is consistent with COPD exacerbation. Based on her age, risk factors and known heart disease will further evaluate for concomitant cardiac pathology versus acute infection.  Patient is low-risk [place], we will send d-dimer to further risk stratify for Pe.  Patient argues C steroids via EMS. We will give additional nebulizers for symptomatic management.  The patient will be placed on continuous pulse oximetry and telemetry for monitoring.  Laboratory evaluation will be sent to evaluate for the above  complaints.     Clinical Course  Comment By Time  BNP normal. Trop negative.  No consolidation on CXR. Willy Eddy, MD 09/17 1818  CT chest negative for PE. Willy Eddy, MD 09/17 1948  Patient with marked improvement after nebs.  Will perform ambulation trial. Willy Eddy, MD 09/17 1950  Patient ambulated without any dyspnea or hypoxia.  Still tachycardic after receiving multiple nebs.  Will repeat trop. Willy Eddy, MD 09/17 2034  Patient requesting discharge home prior to results of repeat troponin. I discussed that the general recommendations is to repeat a troponin based on her age and history of heart disease. Patient states that she will follow-up with cardiology. He adamantly denies she never had any chest pain or pressure over the nebulizer treatments helped. Discussed risks and benefits of discharge prior to completing full workup and repeat troponin patient demonstrates understanding.  Have discussed with the patient and available family all diagnostics and treatments performed thus far and all questions were answered to the best of my ability. The patient demonstrates understanding and agreement with plan.  Willy Eddy, MD 09/17 2100     ____________________________________________   FINAL CLINICAL IMPRESSION(S) / ED DIAGNOSES  Final diagnoses:  COPD exacerbation (HCC)      NEW MEDICATIONS STARTED DURING THIS VISIT:  New Prescriptions   No medications on file     Note:  This document was prepared using Dragon voice recognition software and may include unintentional dictation errors.    Willy Eddy, MD 01/13/16 [phone]

## 2016-01-12 NOTE — ED Triage Notes (Signed)
Was at church and became hot and sob - took 4 puffs off her inhaler with no relief = 2 duonebs and 125mg  solumedrol from ems

## 2016-01-12 NOTE — ED Notes (Signed)
Pt declined duoneb treatment at this time saying she feels much better. Will hold for now

## 2016-01-12 NOTE — ED Notes (Signed)
Pt to ct 

## 2016-02-04 ENCOUNTER — Other Ambulatory Visit: Payer: Self-pay | Admitting: Family Medicine

## 2016-02-04 DIAGNOSIS — Z0000 Encounter for general adult medical examination without abnormal findings: Secondary | ICD-10-CM

## 2016-02-04 DIAGNOSIS — Z780 Asymptomatic menopausal state: Secondary | ICD-10-CM

## 2016-03-11 ENCOUNTER — Ambulatory Visit: Payer: Medicare Other | Attending: Family Medicine

## 2016-03-11 ENCOUNTER — Other Ambulatory Visit: Payer: Medicare Other

## 2016-05-14 ENCOUNTER — Ambulatory Visit: Payer: Medicare Other

## 2016-05-24 ENCOUNTER — Emergency Department
Admission: EM | Admit: 2016-05-24 | Discharge: 2016-05-24 | Disposition: A | Discharge status: Home / Self Care | Payer: Medicare Other | Attending: Emergency Medicine | Admitting: Emergency Medicine

## 2016-05-24 ENCOUNTER — Encounter: Payer: Self-pay | Admitting: Emergency Medicine

## 2016-05-24 DIAGNOSIS — J45909 Unspecified asthma, uncomplicated: Secondary | ICD-10-CM | POA: Insufficient documentation

## 2016-05-24 DIAGNOSIS — I10 Essential (primary) hypertension: Secondary | ICD-10-CM | POA: Insufficient documentation

## 2016-05-24 DIAGNOSIS — J0100 Acute maxillary sinusitis, unspecified: Secondary | ICD-10-CM | POA: Insufficient documentation

## 2016-05-24 DIAGNOSIS — Z79899 Other long term (current) drug therapy: Secondary | ICD-10-CM | POA: Insufficient documentation

## 2016-05-24 DIAGNOSIS — Z87891 Personal history of nicotine dependence: Secondary | ICD-10-CM | POA: Insufficient documentation

## 2016-05-24 DIAGNOSIS — R0981 Nasal congestion: Secondary | ICD-10-CM | POA: Diagnosis present

## 2016-05-24 MED ORDER — AMOXICILLIN-POT CLAVULANATE 875-125 MG PO TABS
1.0000 | ORAL_TABLET | Freq: Once | ORAL | Status: AC
  Administered 2016-05-24: 1 via ORAL
  Filled 2016-05-24: qty 1

## 2016-05-24 MED ORDER — AMOXICILLIN-POT CLAVULANATE 875-125 MG PO TABS: 1.0000 | ORAL_TABLET | Freq: Two times a day (BID) | ORAL | 0 refills | Status: AC

## 2016-05-24 NOTE — ED Provider Notes (Signed)
[facility] Emergency Department Provider Note  ____________________________________________  Time seen: Approximately 1:34 PM  I have reviewed the triage vital signs and the nursing notes.   HISTORY  Chief Complaint Otalgia and Nasal Congestion    HPI Marie Pham is a 71 y.o. female presenting to the emergency department with maxillary and frontal sinus tenderness, congestion, bilateral otalgia, and vertigo for the past week. She has also had occasional rhinorrhea. Patient states that she had an upper respiratory tract infection approximately 10 days ago. She denies purulent rhinorrhea. Patient has a history of seasonal allergies and asthma. She is staying hydrated and tolerating fluids by mouth. Patient denies chest tightness, chest pain, shortness of breath, nausea, abdominal pain, diarrhea and constipation. No alleviating measures have been attempted. Patient works closely with a Personnel officer. 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 Asthma   . Depression   . Hypertension   . Sleep apnea     Patient Active Problem List   Diagnosis Date Noted  . BP (high blood pressure) 10/02/2014  . Asthma, chronic 10/02/2014  . Essential hypertension 10/02/2014  . Obesity 10/02/2014  . Sleep apnea 10/02/2014    Past Surgical History:  Procedure Laterality Date  . ABDOMINAL HYSTERECTOMY    . COLON SURGERY    . EYE SURGERY Left   . HERNIA REPAIR      Prior to Admission medications   Medication Sig Start Date End Date Taking? Authorizing Provider  albuterol (PROVENTIL HFA;VENTOLIN HFA) 108 (90 BASE) MCG/ACT inhaler Inhale into the lungs.    Historical Provider, MD  amoxicillin-clavulanate (AUGMENTIN) 875-125 MG tablet Take 1 tablet by mouth 2 (two) times daily. 05/24/16 06/03/16  Orvil Feil, PA-C  Fluticasone-Salmeterol (ADVAIR) 250-50 MCG/DOSE AEPB  Inhale into the lungs.    Historical Provider, MD  hydrochlorothiazide (HYDRODIURIL) 12.5 MG tablet Take by mouth. 05/29/14 05/29/15  Historical Provider, MD  lisinopril (PRINIVIL,ZESTRIL) 10 MG tablet Take by mouth.    Historical Provider, MD  losartan (COZAAR) 50 MG tablet Take by mouth. 05/01/14 01/12/16  Historical Provider, MD  QUEtiapine (SEROQUEL) 200 MG tablet Take 1 tablet (200 mg total) by mouth at bedtime. 01/01/15   Brandy Hale, MD  venlafaxine XR (EFFEXOR-XR) 150 MG 24 hr capsule i pill in am 01/01/15   Brandy Hale, MD    Allergies Lansoprazole; Penicillins; and Bupropion  Family History  Problem Relation Age of Onset  . Hypertension Sister   . Alcohol abuse Brother   . Hypertension Brother   . Diabetes Brother     Social History Social History  Substance Use Topics  . Smoking status: Former Smoker    Quit date: 10/02/1994  . Smokeless tobacco: Never Used  . Alcohol use No     Review of Systems  Constitutional: Patient has experienced vertigo. Eyes: No visual changes. No discharge ENT: Patient has sinus tenderness and congestion.  Cardiovascular: no chest pain. Respiratory: no cough. No SOB. Gastrointestinal: No abdominal pain.  No nausea, no vomiting.  No diarrhea.  No constipation. Genitourinary: Negative for dysuria. No hematuria Musculoskeletal: Negative for musculoskeletal pain. Skin: Negative for rash, abrasions, lacerations, ecchymosis. Neurological: Negative for focal weakness or numbness. ___________________________________________   PHYSICAL EXAM:  VITAL SIGNS: ED Triage Vitals  Enc Vitals Group     BP 05/24/16 1232 (!) 175/88     Pulse Rate 05/24/16 1232 (!) 107  Resp 05/24/16 1232 20     Temp 05/24/16 1232 98.1 F (36.7 C)     Temp Source 05/24/16 1232 Oral     SpO2 05/24/16 1232 95 %     Weight 05/24/16 1234 209 lb (94.8 kg)     Height 05/24/16 1234 5\' 5"  (1.651 m)     Head Circumference --      Peak Flow --      Pain Score 05/24/16 1234 8      Pain Loc --      Pain Edu? --      Excl. in GC? --     Constitutional: Alert and oriented. Patient is talkative and engaged.  Eyes: Palpebral and bulbar conjunctiva are nonerythematous bilaterally. PERRL. EOMI. No scleral icterus bilaterally. Head: Atraumatic. Patient has maxillary and frontal sinus tenderness. ENT:      Ears: Tympanic membranes are injected bilaterally without effusion or purulent exudate. Bony landmarks are visualized bilaterally.       Nose: Skin overlying nares is without erythema. Nasal turbinates are non-erythematous. Nasal septum is midline.      Mouth/Throat: Mucous membranes are moist. Posterior pharynx is nonerythematous. No tonsillar exudate, hypertrophy or petechiae visualized. Uvula is midline. Neck: Full range of motion. No pain with neck flexion. Hematological/Lymphatic/Immunilogical: No cervical lymphadenopathy.  Cardiovascular:  No pain with palpation over the anterior and posterior chest wall. Normal rate, regular rhythm. Normal S1 and S2. No murmurs, gallops or rubs auscultated.  Respiratory: Trachea is midline. No retractions or presence of deformity. Thoracic expansion is symmetric with unaccentuated tactile fremitus. Resonant and symmetric percussion tones bilaterally. On auscultation, adventitious sounds are absent.  Skin:  Skin is warm, dry and intact. No rash noted.  Psychiatric: Mood and affect are normal for age. Speech and behavior are normal.   ____________________________________________   LABS (all labs ordered are listed, but only abnormal results are displayed)  Labs Reviewed - No data to display ____________________________________________  EKG   ____________________________________________  RADIOLOGY  No results found.  ____________________________________________    PROCEDURES  Procedure(s) performed:    Procedures    Medications  amoxicillin-clavulanate (AUGMENTIN) 875-125 MG per tablet 1 tablet (not  administered)     ____________________________________________   INITIAL IMPRESSION / ASSESSMENT AND PLAN / ED COURSE  Pertinent labs & imaging results that were available during my care of the patient were reviewed by me and considered in my medical decision making (see chart for details).  Review of the [place] CSRS was performed in accordance of the [facility] prior to dispensing any controlled drugs.    Assessment and Plan:  Acute Sinusitis:  Patient presents the emergency department with maxillary and frontal sinus tenderness with rhinorrhea after having an upper respiratory tract infection 10 days ago. Patient has also experienced intermittent vertigo. Acute sinusitis is likely. Patient was discharged with Augmentin. Patient was monitored during her first initial dose with Augmentin to assess for possible adverse drug reaction.Patient tolerated Augmentin well.  She was advised to follow-up with her primary care provider in one week. Vital signs are reassuring aside from hypertension. All patient questions were answered. ____________________________________________  FINAL CLINICAL IMPRESSION(S) / ED DIAGNOSES  Final diagnoses:  Acute non-recurrent maxillary sinusitis      NEW MEDICATIONS STARTED DURING THIS VISIT:  New Prescriptions   AMOXICILLIN-CLAVULANATE (AUGMENTIN) 875-125 MG TABLET    Take 1 tablet by mouth 2 (two) times daily.        This chart was dictated using voice recognition software/Dragon. Despite best efforts  to proofread, errors can occur which can change the meaning. Any change was purely unintentional.    Orvil Feil, PA-C 05/24/16 1845    Jene Every, MD 05/25/16 [phone]

## 2016-05-24 NOTE — ED Triage Notes (Signed)
Pt comes into the ED via POV c/o otalgia bilaterally and congestion.  Patient states the congestion has made her start feeling dizzy that started this morning.  Patient in NAD at this time with even and unlabored respirations.  Patient h/o asthma, and seasonal allergies.

## 2016-05-24 NOTE — ED Notes (Signed)
See triage note  Bilateral ear pain and nasal congestion  States she developed some dizziness this am

## 2016-05-24 NOTE — ED Notes (Signed)
AAOx3.  Skin warm and dry.  NAD 

## 2016-05-25 ENCOUNTER — Emergency Department: Payer: Medicare Other

## 2016-05-25 ENCOUNTER — Emergency Department
Admission: EM | Admit: 2016-05-25 | Discharge: 2016-05-25 | Disposition: A | Discharge status: Home / Self Care | Payer: Medicare Other | Attending: Emergency Medicine | Admitting: Emergency Medicine

## 2016-05-25 DIAGNOSIS — Z87891 Personal history of nicotine dependence: Secondary | ICD-10-CM | POA: Insufficient documentation

## 2016-05-25 DIAGNOSIS — R42 Dizziness and giddiness: Secondary | ICD-10-CM | POA: Diagnosis not present

## 2016-05-25 DIAGNOSIS — J45909 Unspecified asthma, uncomplicated: Secondary | ICD-10-CM | POA: Insufficient documentation

## 2016-05-25 DIAGNOSIS — I10 Essential (primary) hypertension: Secondary | ICD-10-CM | POA: Diagnosis not present

## 2016-05-25 DIAGNOSIS — Z5321 Procedure and treatment not carried out due to patient leaving prior to being seen by health care provider: Secondary | ICD-10-CM | POA: Diagnosis not present

## 2016-05-25 LAB — CBC
HCT: 39.6 % (ref 35.0–47.0)
HEMOGLOBIN: 13.9 g/dL (ref 12.0–16.0)
MCH: 29.5 pg (ref 26.0–34.0)
MCHC: 35.2 g/dL (ref 32.0–36.0)
MCV: 83.7 fL (ref 80.0–100.0)
Platelets: 295 10*3/uL (ref 150–440)
RBC: 4.73 MIL/uL (ref 3.80–5.20)
RDW: 13.7 % (ref 11.5–14.5)
WBC: 11.2 10*3/uL — ABNORMAL HIGH (ref 3.6–11.0)

## 2016-05-25 LAB — COMPREHENSIVE METABOLIC PANEL
ALBUMIN: 3.8 g/dL (ref 3.5–5.0)
ALT: 16 U/L (ref 14–54)
AST: 24 U/L (ref 15–41)
Alkaline Phosphatase: 111 U/L (ref 38–126)
Anion gap: 7 (ref 5–15)
BUN: 20 mg/dL (ref 6–20)
CHLORIDE: 105 mmol/L (ref 101–111)
CO2: 29 mmol/L (ref 22–32)
Calcium: 8.9 mg/dL (ref 8.9–10.3)
Creatinine, Ser: 0.77 mg/dL (ref 0.44–1.00)
GFR calc Af Amer: >60 mL/min (ref >60)
GFR calc non Af Amer: >60 mL/min (ref >60)
GLUCOSE: 187 mg/dL — AB (ref 65–99)
POTASSIUM: 3.8 mmol/L (ref 3.5–5.1)
Sodium: 141 mmol/L (ref 135–145)
Total Bilirubin: 0.5 mg/dL (ref 0.3–1.2)
Total Protein: 7.6 g/dL (ref 6.5–8.1)

## 2016-05-25 LAB — DIFFERENTIAL
BASOS ABS: 0.1 10*3/uL (ref 0–0.1)
BASOS PCT: 1 %
EOS ABS: 0.1 10*3/uL (ref 0–0.7)
Eosinophils Relative: 1 %
LYMPHS ABS: 2.8 10*3/uL (ref 1.0–3.6)
Lymphocytes Relative: 25 %
Monocytes Absolute: 0.6 10*3/uL (ref 0.2–0.9)
Monocytes Relative: 5 %
NEUTROS PCT: 68 %
Neutro Abs: 7.6 10*3/uL — ABNORMAL HIGH (ref 1.4–6.5)

## 2016-05-25 LAB — TROPONIN I: Troponin I: <0.03 ng/mL (ref <0.03)

## 2016-05-25 NOTE — ED Notes (Signed)
Pt returns tonight for continued dizziness; says she's only taken 2 antibiotic tablets; talking in complete coherent sentences

## 2016-05-25 NOTE — ED Triage Notes (Signed)
Patient states that she was seen here earlier today for dizziness and headache that started yesterday. Patient states that she was diagnosed with sinus infection and ear infection. Patient states that she continues to feel dizzy.

## 2016-10-26 ENCOUNTER — Other Ambulatory Visit: Payer: Self-pay | Admitting: Specialist

## 2016-10-26 DIAGNOSIS — R059 Cough, unspecified: Secondary | ICD-10-CM

## 2016-10-26 DIAGNOSIS — R918 Other nonspecific abnormal finding of lung field: Secondary | ICD-10-CM

## 2016-10-26 DIAGNOSIS — R05 Cough: Secondary | ICD-10-CM

## 2016-11-04 ENCOUNTER — Ambulatory Visit: Payer: Medicare Other

## 2016-11-12 ENCOUNTER — Other Ambulatory Visit (HOSPITAL_COMMUNITY): Payer: Medicare Other

## 2016-11-16 ENCOUNTER — Ambulatory Visit: Payer: Medicare Other | Attending: Specialist

## 2017-02-14 ENCOUNTER — Emergency Department: Payer: Medicare Other

## 2017-02-14 ENCOUNTER — Emergency Department
Admission: EM | Admit: 2017-02-14 | Discharge: 2017-02-14 | Disposition: A | Discharge status: Home / Self Care | Payer: Medicare Other | Attending: Emergency Medicine | Admitting: Emergency Medicine

## 2017-02-14 DIAGNOSIS — R55 Syncope and collapse: Secondary | ICD-10-CM | POA: Insufficient documentation

## 2017-02-14 DIAGNOSIS — Z87891 Personal history of nicotine dependence: Secondary | ICD-10-CM | POA: Insufficient documentation

## 2017-02-14 DIAGNOSIS — R05 Cough: Secondary | ICD-10-CM | POA: Diagnosis present

## 2017-02-14 DIAGNOSIS — R0602 Shortness of breath: Secondary | ICD-10-CM | POA: Insufficient documentation

## 2017-02-14 DIAGNOSIS — J45909 Unspecified asthma, uncomplicated: Secondary | ICD-10-CM | POA: Insufficient documentation

## 2017-02-14 DIAGNOSIS — Z79899 Other long term (current) drug therapy: Secondary | ICD-10-CM | POA: Insufficient documentation

## 2017-02-14 DIAGNOSIS — I10 Essential (primary) hypertension: Secondary | ICD-10-CM | POA: Diagnosis not present

## 2017-02-14 LAB — BASIC METABOLIC PANEL
Anion gap: 2 — ABNORMAL LOW (ref 5–15)
BUN: 9 mg/dL (ref 6–20)
CHLORIDE: 102 mmol/L (ref 101–111)
CO2: 31 mmol/L (ref 22–32)
Calcium: 9.2 mg/dL (ref 8.9–10.3)
Creatinine, Ser: 0.84 mg/dL (ref 0.44–1.00)
GFR calc non Af Amer: >60 mL/min (ref >60)
Glucose, Bld: 85 mg/dL (ref 65–99)
POTASSIUM: 4.4 mmol/L (ref 3.5–5.1)
SODIUM: 135 mmol/L (ref 135–145)

## 2017-02-14 LAB — CBC WITH DIFFERENTIAL/PLATELET
Basophils Absolute: 0 10*3/uL (ref 0–0.1)
Basophils Relative: 1 %
Eosinophils Absolute: 0.1 10*3/uL (ref 0–0.7)
Eosinophils Relative: 1 %
HEMATOCRIT: 43.6 % (ref 35.0–47.0)
HEMOGLOBIN: 14.1 g/dL (ref 12.0–16.0)
LYMPHS ABS: 2.6 10*3/uL (ref 1.0–3.6)
Lymphocytes Relative: 33 %
MCH: 27.8 pg (ref 26.0–34.0)
MCHC: 32.4 g/dL (ref 32.0–36.0)
MCV: 85.7 fL (ref 80.0–100.0)
Monocytes Absolute: 0.9 10*3/uL (ref 0.2–0.9)
Monocytes Relative: 11 %
NEUTROS ABS: 4.4 10*3/uL (ref 1.4–6.5)
NEUTROS PCT: 54 %
Platelets: 200 10*3/uL (ref 150–440)
RBC: 5.09 MIL/uL (ref 3.80–5.20)
RDW: 13.8 % (ref 11.5–14.5)
WBC: 8.1 10*3/uL (ref 3.6–11.0)

## 2017-02-14 LAB — URINALYSIS, COMPLETE (UACMP) WITH MICROSCOPIC
BILIRUBIN URINE: NEGATIVE
GLUCOSE, UA: NEGATIVE mg/dL
HGB URINE DIPSTICK: NEGATIVE
KETONES UR: NEGATIVE mg/dL
Nitrite: NEGATIVE
Protein, ur: 30 mg/dL — AB
SPECIFIC GRAVITY, URINE: 1.017 (ref 1.005–1.030)
pH: 5 (ref 5.0–8.0)

## 2017-02-14 LAB — TROPONIN I

## 2017-02-14 MED ORDER — SODIUM CHLORIDE 0.9 % IV BOLUS (SEPSIS)
500.0000 mL | Freq: Once | INTRAVENOUS | Status: AC
  Administered 2017-02-14: 500 mL via INTRAVENOUS

## 2017-02-14 NOTE — Discharge Instructions (Signed)
Return to the ER for new or worsening dizziness, persistent episodes of passing out, difficulty breathing, weakness, chest pain, or any other new or worsening symptoms that concern you.  Follow-up with your primary care doctor.

## 2017-02-14 NOTE — ED Provider Notes (Signed)
[facility] Emergency Department Provider Note ____________________________________________   First MD Initiated Contact with Patient 02/14/17 1154     (approximate)  I have reviewed the triage vital signs and the nursing notes.   HISTORY  Chief Complaint Shortness of Breath    HPI Marie Pham is a 71 y.o. female with a history of hypertension, asthma, sleep apnea, and obesity, who presents with syncope, acute onset after patient began coughing and felt like her throat was closing, now resolved, and not associated with chest pain or palpitations. Patient reports numerous prior similar episodes occurring over the last several years, in which she will begin coughing, become short of breath and feel like her throat is closing, and then nearly lose consciousness or actually fully lose consciousness but then awake relatively quickly. Patient states this occurs up to a few times a week. He states that today she came to the emergency department because this happened while she was at church.   Past Medical History:  Diagnosis Date  . Asthma   . Depression   . Hypertension   . Sleep apnea     Patient Active Problem List   Diagnosis Date Noted  . BP (high blood pressure) 10/02/2014  . Asthma, chronic 10/02/2014  . Essential hypertension 10/02/2014  . Obesity 10/02/2014  . Sleep apnea 10/02/2014    Past Surgical History:  Procedure Laterality Date  . ABDOMINAL HYSTERECTOMY    . COLON SURGERY    . EYE SURGERY Left   . HERNIA REPAIR      Prior to Admission medications   Medication Sig Start Date End Date Taking? Authorizing Provider  albuterol (PROVENTIL HFA;VENTOLIN HFA) 108 (90 BASE) MCG/ACT inhaler Inhale into the lungs.    [provider]  Fluticasone-Salmeterol (ADVAIR) 250-50 MCG/DOSE AEPB Inhale into the lungs.    [provider]  hydrochlorothiazide (HYDRODIURIL) 12.5 MG tablet Take by mouth. 05/29/14 05/29/15  [provider]  lisinopril (PRINIVIL,ZESTRIL) 10 MG tablet Take by mouth.    [provider]  losartan (COZAAR) 50 MG tablet Take by mouth. 05/01/14 01/12/16  [provider]  QUEtiapine (SEROQUEL) 200 MG tablet Take 1 tablet (200 mg total) by mouth at bedtime. 01/01/15   Brandy Hale, MD  venlafaxine XR (EFFEXOR-XR) 150 MG 24 hr capsule i pill in am 01/01/15   Brandy Hale, MD    Allergies Lansoprazole; Penicillins; Prozac [fluoxetine]; and Bupropion  Family History  Problem Relation Age of Onset  . Hypertension Sister   . Alcohol abuse Brother   . Hypertension Brother   . Diabetes Brother     Social History Social History  Substance Use Topics  . Smoking status: Former Smoker    Quit date: 10/02/1994  . Smokeless tobacco: Never Used  . Alcohol use No    Review of Systems  Constitutional: No fever. Eyes: No visual changes. ENT: No sore throat. Cardiovascular: Denies chest pain. Respiratory: Positive for resolved shortness of breath. Gastrointestinal: No nausea, no vomiting.   Genitourinary: Negative for dysuria.  Musculoskeletal: Negative for back pain. Skin: Negative for rash. Neurological: Negative for headache.   ____________________________________________   PHYSICAL EXAM:  VITAL SIGNS: ED Triage Vitals [02/14/17 1138]  Enc Vitals Group     BP (!) 146/126     Pulse Rate (!) 108     Resp 13     Temp 97.9 F (36.6 C)     Temp Source Oral     SpO2 96 %  Weight 203 lb (92.1 kg)     Height 5\' 5"  (1.651 m)     Head Circumference      Peak Flow      Pain Score      Pain Loc      Pain Edu?      Excl. in GC?     Constitutional: Alert and oriented. Well appearing and in no acute distress. Eyes: Conjunctivae are normal. EOMI.  PERRLA.  Head: Atraumatic. Nose: No congestion/rhinnorhea. Mouth/Throat: Mucous membranes are moist.   Neck: Normal range of motion.  Cardiovascular: Normal rate, regular rhythm. Grossly normal heart sounds.  Good  peripheral circulation. Respiratory: Normal respiratory effort.  No retractions. Lungs CTAB. No wheeze.  Gastrointestinal: Soft and nontender. No distention.  Genitourinary: No CVA tenderness. Musculoskeletal: No lower extremity edema.  Extremities warm and well perfused.  Neurologic:  Normal speech and language. No gross focal neurologic deficits are appreciated.  Skin:  Skin is warm and dry. No rash noted. Psychiatric: Mood and affect are normal. Speech and behavior are normal.  ____________________________________________   LABS (all labs ordered are listed, but only abnormal results are displayed)  Labs Reviewed  BASIC METABOLIC PANEL - Abnormal; Notable for the following:       Result Value   Anion gap 2 (*)    All other components within normal limits  URINALYSIS, COMPLETE (UACMP) WITH MICROSCOPIC - Abnormal; Notable for the following:    Color, Urine YELLOW (*)    APPearance HAZY (*)    Protein, ur 30 (*)    Leukocytes, UA TRACE (*)    Bacteria, UA RARE (*)    Squamous Epithelial / LPF 0-5 (*)    All other components within normal limits  CBC WITH DIFFERENTIAL/PLATELET  TROPONIN I   ____________________________________________  EKG  ED ECG REPORT I, Dionne BucySebastian Leota Maka, the attending physician, personally viewed and interpreted this ECG.  Date: 02/14/2017 EKG Time: 12:15 Rate: 103 Rhythm: sinus tachycardia QRS Axis: normal Intervals: right bundle branch block ST/T Wave abnormalities: less than 1 mm ST elevation laterally Narrative Interpretation: no evidence of acute ischemia; no significant change when compared to EKG of 05/25/2016   ____________________________________________  RADIOLOGY  Chest x-ray: No infiltrate, congestion or other active cardiopulmonary disease  ____________________________________________   PROCEDURES  Procedure(s) performed: No    Critical Care performed: No ____________________________________________   INITIAL  IMPRESSION / ASSESSMENT AND PLAN / ED COURSE  Pertinent labs & imaging results that were available during my care of the patient were reviewed by me and considered in my medical decision making (see chart for details).  71 year old female with past medical history as noted above presents with an episode of syncope after a coughing fit and feeling like her throat was closing. Patient reports numerous similar episodes in the past that have been occurring for the last several years, and states she has talked to her doctor about it and was told that she should get a carotid ultrasound and some other tests as an outpatient.  Review of past medical records in Epic reveals a visit for very similar episode in September 2017, and also for shortness of breath and 2016. In 2017, patient had a negative CT chest to rule out PE.  On exam, vital signs are normal except for borderline tachycardia, and there are no other significant exam findings. Lungs are clear. Patient states that she feels "a little crazy" after the episode, but otherwise is asymptomatic.  Overall differential includes asthma exacerbation, cough-induced vasovagal syncope,  or other chronic cause.  Although pt is borderline tachy, there is no evidence for PE given negative PE workup in the past, no leg pain or swelling, no hypoxia, and no specific risk factors for DVT or PE.  Patient states this episode today was not significantly different than previously. Patient has had relatively extensive workup in the past. We will obtain basic labs, troponin, chest x-ray, and observe briefly in the ED; if no recurrence of symptoms, and negative workup, likely discharge home with close primary care follow-up.     ----------------------------------------- 3:13 PM on 02/14/2017 -----------------------------------------  Patient is now asymptomatic.  Vital signs are stable with heart rate in the 90s.  She feels well to go home.  Lab workup, CXR, and UA are  unremarkable.  No evidence of acute process.  Patient instructed to follow up with her regular doctor for her ongoing chronic episodes.  Return precautions given.   ____________________________________________   FINAL CLINICAL IMPRESSION(S) / ED DIAGNOSES  Final diagnoses:  Syncope, tussive      NEW MEDICATIONS STARTED DURING THIS VISIT:  New Prescriptions   No medications on file     Note:  This document was prepared using Dragon voice recognition software and may include unintentional dictation errors.    Dionne Bucy, MD 02/14/17 1515

## 2017-02-14 NOTE — ED Triage Notes (Signed)
Pt to ED via [facility] c/o SOB. Per EMS pt was at church singing and all of sudden began to have a cough induced asthma attack. Pt reports administering 1 puff from albuterol inhaler. At the scene pt O2 sats 99%. Pt alert and oriented, talking in complete sentences. No acute distress at this time.

## 2017-10-11 ENCOUNTER — Other Ambulatory Visit: Payer: Self-pay | Admitting: Family Medicine

## 2017-10-11 DIAGNOSIS — Z1231 Encounter for screening mammogram for malignant neoplasm of breast: Secondary | ICD-10-CM

## 2018-06-02 ENCOUNTER — Other Ambulatory Visit: Payer: Self-pay | Admitting: Physician Assistant

## 2018-06-02 DIAGNOSIS — M25552 Pain in left hip: Secondary | ICD-10-CM

## 2018-06-02 DIAGNOSIS — M5442 Lumbago with sciatica, left side: Principal | ICD-10-CM

## 2018-06-02 DIAGNOSIS — G8929 Other chronic pain: Secondary | ICD-10-CM

## 2018-06-14 ENCOUNTER — Ambulatory Visit
Admission: RE | Admit: 2018-06-14 | Discharge: 2018-06-14 | Disposition: A | Discharge status: Home / Self Care | Payer: Medicare HMO | Source: Ambulatory Visit | Attending: Physician Assistant | Admitting: Physician Assistant

## 2018-06-14 DIAGNOSIS — M25552 Pain in left hip: Secondary | ICD-10-CM

## 2018-06-14 DIAGNOSIS — M5442 Lumbago with sciatica, left side: Secondary | ICD-10-CM | POA: Insufficient documentation

## 2018-06-14 DIAGNOSIS — G8929 Other chronic pain: Secondary | ICD-10-CM | POA: Insufficient documentation

## 2019-02-13 ENCOUNTER — Other Ambulatory Visit: Payer: Self-pay | Admitting: Family Medicine

## 2019-02-13 DIAGNOSIS — Z0000 Encounter for general adult medical examination without abnormal findings: Secondary | ICD-10-CM

## 2019-02-13 DIAGNOSIS — Z1231 Encounter for screening mammogram for malignant neoplasm of breast: Secondary | ICD-10-CM

## 2019-08-09 ENCOUNTER — Other Ambulatory Visit: Payer: Self-pay | Admitting: Family Medicine

## 2019-08-09 DIAGNOSIS — Z0000 Encounter for general adult medical examination without abnormal findings: Secondary | ICD-10-CM

## 2019-08-09 DIAGNOSIS — E559 Vitamin D deficiency, unspecified: Secondary | ICD-10-CM

## 2019-08-09 DIAGNOSIS — Z1231 Encounter for screening mammogram for malignant neoplasm of breast: Secondary | ICD-10-CM

## 2021-06-08 ENCOUNTER — Other Ambulatory Visit: Payer: Self-pay

## 2021-06-08 ENCOUNTER — Emergency Department: Payer: Medicare Other

## 2021-06-08 ENCOUNTER — Emergency Department
Admission: EM | Admit: 2021-06-08 | Discharge: 2021-06-08 | Disposition: A | Discharge status: Home / Self Care | Payer: Medicare Other | Attending: Emergency Medicine | Admitting: Emergency Medicine

## 2021-06-08 DIAGNOSIS — K2900 Acute gastritis without bleeding: Secondary | ICD-10-CM | POA: Diagnosis not present

## 2021-06-08 DIAGNOSIS — I10 Essential (primary) hypertension: Secondary | ICD-10-CM | POA: Diagnosis not present

## 2021-06-08 DIAGNOSIS — J45909 Unspecified asthma, uncomplicated: Secondary | ICD-10-CM | POA: Diagnosis not present

## 2021-06-08 DIAGNOSIS — R1013 Epigastric pain: Secondary | ICD-10-CM | POA: Diagnosis present

## 2021-06-08 LAB — CBC
HCT: 42.6 % (ref 36.0–46.0)
Hemoglobin: 13.7 g/dL (ref 12.0–15.0)
MCH: 28.4 pg (ref 26.0–34.0)
MCHC: 32.2 g/dL (ref 30.0–36.0)
MCV: 88.4 fL (ref 80.0–100.0)
Platelets: 300 10*3/uL (ref 150–400)
RBC: 4.82 MIL/uL (ref 3.87–5.11)
RDW: 13.2 % (ref 11.5–15.5)
WBC: 7.8 10*3/uL (ref 4.0–10.5)
nRBC: 0 % (ref 0.0–0.2)

## 2021-06-08 LAB — COMPREHENSIVE METABOLIC PANEL
ALT: 14 U/L (ref 0–44)
AST: 17 U/L (ref 15–41)
Albumin: 3.6 g/dL (ref 3.5–5.0)
Alkaline Phosphatase: 100 U/L (ref 38–126)
Anion gap: 7 (ref 5–15)
BUN: 9 mg/dL (ref 8–23)
CO2: 27 mmol/L (ref 22–32)
Calcium: 8.9 mg/dL (ref 8.9–10.3)
Chloride: 104 mmol/L (ref 98–111)
Creatinine, Ser: 0.64 mg/dL (ref 0.44–1.00)
GFR, Estimated: >60 mL/min (ref >60)
Glucose, Bld: 147 mg/dL — ABNORMAL HIGH (ref 70–99)
Potassium: 4.1 mmol/L (ref 3.5–5.1)
Sodium: 138 mmol/L (ref 135–145)
Total Bilirubin: 0.7 mg/dL (ref 0.3–1.2)
Total Protein: 7.3 g/dL (ref 6.5–8.1)

## 2021-06-08 LAB — TROPONIN I (HIGH SENSITIVITY): Troponin I (High Sensitivity): 12 ng/L (ref <18)

## 2021-06-08 LAB — LIPASE, BLOOD: Lipase: 41 U/L (ref 11–51)

## 2021-06-08 MED ORDER — FAMOTIDINE 40 MG PO TABS: 40.0000 mg | ORAL_TABLET | Freq: Every evening | ORAL | 1 refills | Status: AC

## 2021-06-08 MED ORDER — SUCRALFATE 1 G PO TABS: 1.0000 g | ORAL_TABLET | Freq: Four times a day (QID) | ORAL | 0 refills | Status: DC

## 2021-06-08 NOTE — ED Provider Notes (Signed)
[facility] Provider Note    Event Date/Time   First MD Initiated Contact with Patient 06/08/21 1108     (approximate)   History   Abdominal Pain   HPI  Marie Pham is a 76 y.o. female with a history of asthma, hypertension, sleep apnea who presents with complaints of epigastric discomfort.  She reports after she eats or drinks anything she has a burning discomfort in her epigastrium.  She denies chest pain.  No shortness of breath.  No fevers chills.  No diarrhea.  Reports this has been intermittent over the last week.     Physical Exam   Triage Vital Signs: ED Triage Vitals  Enc Vitals Group     BP 06/08/21 1103 (!) 138/104     Pulse Rate 06/08/21 1101 (!) 118     Resp 06/08/21 1055 20     Temp 06/08/21 1055 98.5 F (36.9 C)     Temp src --      SpO2 06/08/21 1055 98 %     Weight 06/08/21 1054 89.4 kg (197 lb)     Height 06/08/21 1054 1.651 m (5\' 5" )     Head Circumference --      Peak Flow --      Pain Score 06/08/21 1053 0     Pain Loc --      Pain Edu? --      Excl. in GC? --     Most recent vital signs: Vitals:   06/08/21 1101 06/08/21 1103  BP:  (!) 138/104  Pulse: (!) 118   Resp:    Temp:    SpO2:       General: Awake, no distress.  CV:  Good peripheral perfusion.  Heart rate on my exam is 80 Resp:  Normal effort.  Abd:  No distention.  Soft, nontender    ED Results / Procedures / Treatments   Labs (all labs ordered are listed, but only abnormal results are displayed) Labs Reviewed  COMPREHENSIVE METABOLIC PANEL - Abnormal; Notable for the following components:      Result Value   Glucose, Bld 147 (*)    All other components within normal limits  LIPASE, BLOOD  CBC  URINALYSIS, ROUTINE W REFLEX MICROSCOPIC  TROPONIN I (HIGH SENSITIVITY)     EKG  ED ECG REPORT I, 08/06/21, the attending physician, personally viewed and interpreted this ECG.  Date: 06/08/2021  Rhythm: normal sinus rhythm QRS  Axis: Abnormal Intervals: Abnormal ST/T Wave abnormalities: Abnormal Narrative Interpretation: Unchanged from prior, no chest pain    RADIOLOGY     PROCEDURES:  Critical Care performed:   Procedures   MEDICATIONS ORDERED IN ED: Medications - No data to display   IMPRESSION / MDM / ASSESSMENT AND PLAN / ED COURSE  I reviewed the triage vital signs and the nursing notes.  Patient presents with epigastric pain as detailed above.  EKG is abnormal but unchanged from prior, she has no chest pain.  Her high sensitive troponin is normal.  Lab work is reassuring, normal CMP, normal lipase, normal CBC  Given the location of her pain suspicious for GERD/PUD, no right upper quadrant tenderness to suggest cholelithiasis  Pain typically occurs with eating or drinking and is mild and burning in nature  We will trial the patient on Protonix, Carafate have her follow-up with GI for further evaluation  Nurse rechecked heart rate before discharge heart rate is 81  FINAL CLINICAL IMPRESSION(S) / ED DIAGNOSES   Final diagnoses:  Acute gastritis without hemorrhage, unspecified gastritis type     Rx / DC Orders   ED Discharge Orders          Ordered    famotidine (PEPCID) 40 MG tablet  Every evening        06/08/21 1201    sucralfate (CARAFATE) 1 g tablet  4 times daily        06/08/21 1201             Note:  This document was prepared using Dragon voice recognition software and may include unintentional dictation errors.   Jene Every, MD 06/08/21 1210

## 2021-06-08 NOTE — ED Notes (Signed)
EKG performed and shown to Dr Joni Fears. No change from previous EKG in 2018-no stemi.

## 2021-06-08 NOTE — ED Triage Notes (Signed)
Pt to ED for upper abd pain that started a week ago. Reports pain after eating or drinking. Hx GERD. Denies n/v

## 2022-07-30 DIAGNOSIS — Z0131 Encounter for examination of blood pressure with abnormal findings: Secondary | ICD-10-CM | POA: Diagnosis not present

## 2022-07-30 DIAGNOSIS — E119 Type 2 diabetes mellitus without complications: Secondary | ICD-10-CM | POA: Diagnosis not present

## 2022-07-30 DIAGNOSIS — N390 Urinary tract infection, site not specified: Secondary | ICD-10-CM | POA: Diagnosis not present

## 2022-07-30 DIAGNOSIS — Z1389 Encounter for screening for other disorder: Secondary | ICD-10-CM | POA: Diagnosis not present

## 2022-07-30 DIAGNOSIS — Z712 Person consulting for explanation of examination or test findings: Secondary | ICD-10-CM | POA: Diagnosis not present

## 2022-07-31 DIAGNOSIS — N390 Urinary tract infection, site not specified: Secondary | ICD-10-CM | POA: Diagnosis not present

## 2022-09-03 ENCOUNTER — Emergency Department
Admission: EM | Admit: 2022-09-03 | Discharge: 2022-09-03 | Discharge status: Against Medical Advice | Payer: Medicare HMO | Attending: Emergency Medicine | Admitting: Emergency Medicine

## 2022-09-03 ENCOUNTER — Encounter: Payer: Self-pay | Admitting: Emergency Medicine

## 2022-09-03 ENCOUNTER — Other Ambulatory Visit: Payer: Self-pay

## 2022-09-03 DIAGNOSIS — M542 Cervicalgia: Secondary | ICD-10-CM | POA: Diagnosis not present

## 2022-09-03 DIAGNOSIS — R519 Headache, unspecified: Secondary | ICD-10-CM | POA: Insufficient documentation

## 2022-09-03 DIAGNOSIS — Z5321 Procedure and treatment not carried out due to patient leaving prior to being seen by health care provider: Secondary | ICD-10-CM | POA: Diagnosis not present

## 2022-09-03 DIAGNOSIS — Y92410 Unspecified street and highway as the place of occurrence of the external cause: Secondary | ICD-10-CM | POA: Insufficient documentation

## 2022-09-03 DIAGNOSIS — R079 Chest pain, unspecified: Secondary | ICD-10-CM | POA: Diagnosis not present

## 2022-09-03 DIAGNOSIS — M25511 Pain in right shoulder: Secondary | ICD-10-CM | POA: Diagnosis not present

## 2022-09-03 DIAGNOSIS — M545 Low back pain, unspecified: Secondary | ICD-10-CM | POA: Diagnosis not present
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
## 2022-11-09 ENCOUNTER — Emergency Department
Admission: EM | Admit: 2022-11-09 | Discharge: 2022-11-09 | Disposition: A | Discharge status: Home / Self Care | Payer: Medicare HMO | Attending: Emergency Medicine | Admitting: Emergency Medicine

## 2022-11-09 ENCOUNTER — Other Ambulatory Visit: Payer: Self-pay

## 2022-11-09 ENCOUNTER — Emergency Department: Payer: Medicare HMO

## 2022-11-09 DIAGNOSIS — S134XXA Sprain of ligaments of cervical spine, initial encounter: Secondary | ICD-10-CM

## 2022-11-09 DIAGNOSIS — R0789 Other chest pain: Secondary | ICD-10-CM | POA: Diagnosis not present

## 2022-11-09 DIAGNOSIS — J45909 Unspecified asthma, uncomplicated: Secondary | ICD-10-CM | POA: Diagnosis not present

## 2022-11-09 DIAGNOSIS — I700 Atherosclerosis of aorta: Secondary | ICD-10-CM | POA: Diagnosis not present

## 2022-11-09 DIAGNOSIS — S0990XA Unspecified injury of head, initial encounter: Secondary | ICD-10-CM | POA: Diagnosis not present

## 2022-11-09 DIAGNOSIS — R7989 Other specified abnormal findings of blood chemistry: Secondary | ICD-10-CM

## 2022-11-09 DIAGNOSIS — R519 Headache, unspecified: Secondary | ICD-10-CM | POA: Diagnosis not present

## 2022-11-09 DIAGNOSIS — S161XXA Strain of muscle, fascia and tendon at neck level, initial encounter: Secondary | ICD-10-CM | POA: Insufficient documentation

## 2022-11-09 DIAGNOSIS — R0602 Shortness of breath: Secondary | ICD-10-CM | POA: Diagnosis not present

## 2022-11-09 DIAGNOSIS — Y92410 Unspecified street and highway as the place of occurrence of the external cause: Secondary | ICD-10-CM | POA: Insufficient documentation

## 2022-11-09 DIAGNOSIS — K5730 Diverticulosis of large intestine without perforation or abscess without bleeding: Secondary | ICD-10-CM | POA: Diagnosis not present

## 2022-11-09 DIAGNOSIS — S299XXA Unspecified injury of thorax, initial encounter: Secondary | ICD-10-CM | POA: Diagnosis not present

## 2022-11-09 DIAGNOSIS — I6523 Occlusion and stenosis of bilateral carotid arteries: Secondary | ICD-10-CM | POA: Diagnosis not present

## 2022-11-09 DIAGNOSIS — I10 Essential (primary) hypertension: Secondary | ICD-10-CM | POA: Diagnosis not present

## 2022-11-09 DIAGNOSIS — S199XXA Unspecified injury of neck, initial encounter: Secondary | ICD-10-CM | POA: Diagnosis not present

## 2022-11-09 LAB — BASIC METABOLIC PANEL
Anion gap: 8 (ref 5–15)
BUN: 17 mg/dL (ref 8–23)
CO2: 25 mmol/L (ref 22–32)
Calcium: 9 mg/dL (ref 8.9–10.3)
Chloride: 106 mmol/L (ref 98–111)
Creatinine, Ser: 1.27 mg/dL — ABNORMAL HIGH (ref 0.44–1.00)
GFR, Estimated: 44 mL/min — ABNORMAL LOW (ref >60)
Glucose, Bld: 130 mg/dL — ABNORMAL HIGH (ref 70–99)
Potassium: 3.3 mmol/L — ABNORMAL LOW (ref 3.5–5.1)
Sodium: 139 mmol/L (ref 135–145)

## 2022-11-09 LAB — CBC
HCT: 40.8 % (ref 36.0–46.0)
Hemoglobin: 13.5 g/dL (ref 12.0–15.0)
MCH: 28.7 pg (ref 26.0–34.0)
MCHC: 33.1 g/dL (ref 30.0–36.0)
MCV: 86.8 fL (ref 80.0–100.0)
Platelets: 287 10*3/uL (ref 150–400)
RBC: 4.7 MIL/uL (ref 3.87–5.11)
RDW: 12.7 % (ref 11.5–15.5)
WBC: 7.4 10*3/uL (ref 4.0–10.5)
nRBC: 0 % (ref 0.0–0.2)

## 2022-11-09 LAB — TROPONIN I (HIGH SENSITIVITY): Troponin I (High Sensitivity): 14 ng/L (ref <18)

## 2022-11-09 MED ORDER — ACETAMINOPHEN 500 MG PO TABS
1000.0000 mg | ORAL_TABLET | Freq: Once | ORAL | Status: AC
  Administered 2022-11-09: 1000 mg via ORAL
  Filled 2022-11-09: qty 2

## 2022-11-09 MED ORDER — MUSCLE RUB 10-15 % EX CREA: 1.0000 | TOPICAL_CREAM | CUTANEOUS | 0 refills | Status: AC | PRN
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
## 2022-11-09 NOTE — Discharge Instructions (Addendum)
Take acetaminophen 650 mg every 6 hours for pain.  Take with food.  Use muscle rub as prescribed for muscle soreness and pain.  Your kidney numbers were slightly elevated today compared to your testing in years prior, make sure you stay hydrated and follow-up with Dr. Allena Katz to get these numbers rechecked in the next 1 to 2 weeks.

## 2022-11-25 DIAGNOSIS — Z0100 Encounter for examination of eyes and vision without abnormal findings: Secondary | ICD-10-CM | POA: Diagnosis not present

## 2022-11-25 DIAGNOSIS — H401112 Primary open-angle glaucoma, right eye, moderate stage: Secondary | ICD-10-CM | POA: Diagnosis not present

## 2022-12-10 DIAGNOSIS — I10 Essential (primary) hypertension: Secondary | ICD-10-CM | POA: Diagnosis not present

## 2022-12-10 DIAGNOSIS — F4329 Adjustment disorder with other symptoms: Secondary | ICD-10-CM | POA: Diagnosis not present

## 2022-12-10 DIAGNOSIS — R5383 Other fatigue: Secondary | ICD-10-CM | POA: Diagnosis not present

## 2022-12-10 DIAGNOSIS — E119 Type 2 diabetes mellitus without complications: Secondary | ICD-10-CM | POA: Diagnosis not present

## 2022-12-10 DIAGNOSIS — F339 Major depressive disorder, recurrent, unspecified: Secondary | ICD-10-CM | POA: Diagnosis not present

## 2022-12-10 DIAGNOSIS — M791 Myalgia, unspecified site: Secondary | ICD-10-CM | POA: Diagnosis not present

## 2022-12-10 DIAGNOSIS — E559 Vitamin D deficiency, unspecified: Secondary | ICD-10-CM | POA: Diagnosis not present

## 2022-12-10 DIAGNOSIS — Z0130 Encounter for examination of blood pressure without abnormal findings: Secondary | ICD-10-CM | POA: Diagnosis not present

## 2022-12-10 DIAGNOSIS — Z0131 Encounter for examination of blood pressure with abnormal findings: Secondary | ICD-10-CM | POA: Diagnosis not present

## 2022-12-10 DIAGNOSIS — Z712 Person consulting for explanation of examination or test findings: Secondary | ICD-10-CM | POA: Diagnosis not present

## 2022-12-10 DIAGNOSIS — Z1389 Encounter for screening for other disorder: Secondary | ICD-10-CM | POA: Diagnosis not present

## 2022-12-31 DIAGNOSIS — Z1389 Encounter for screening for other disorder: Secondary | ICD-10-CM | POA: Diagnosis not present

## 2022-12-31 DIAGNOSIS — Z0000 Encounter for general adult medical examination without abnormal findings: Secondary | ICD-10-CM | POA: Diagnosis not present

## 2022-12-31 DIAGNOSIS — F4329 Adjustment disorder with other symptoms: Secondary | ICD-10-CM | POA: Diagnosis not present

## 2022-12-31 DIAGNOSIS — R5383 Other fatigue: Secondary | ICD-10-CM | POA: Diagnosis not present

## 2022-12-31 DIAGNOSIS — Z1331 Encounter for screening for depression: Secondary | ICD-10-CM | POA: Diagnosis not present

## 2022-12-31 DIAGNOSIS — Z0130 Encounter for examination of blood pressure without abnormal findings: Secondary | ICD-10-CM | POA: Diagnosis not present

## 2022-12-31 DIAGNOSIS — Z712 Person consulting for explanation of examination or test findings: Secondary | ICD-10-CM | POA: Diagnosis not present

## 2022-12-31 DIAGNOSIS — F331 Major depressive disorder, recurrent, moderate: Secondary | ICD-10-CM | POA: Diagnosis not present

## 2022-12-31 DIAGNOSIS — R944 Abnormal results of kidney function studies: Secondary | ICD-10-CM | POA: Diagnosis not present

## 2022-12-31 DIAGNOSIS — Z0131 Encounter for examination of blood pressure with abnormal findings: Secondary | ICD-10-CM | POA: Diagnosis not present

## 2023-01-13 DIAGNOSIS — M8588 Other specified disorders of bone density and structure, other site: Secondary | ICD-10-CM | POA: Diagnosis not present

## 2023-01-13 DIAGNOSIS — Z1231 Encounter for screening mammogram for malignant neoplasm of breast: Secondary | ICD-10-CM | POA: Diagnosis not present

## 2023-01-13 DIAGNOSIS — M85851 Other specified disorders of bone density and structure, right thigh: Secondary | ICD-10-CM | POA: Diagnosis not present

## 2023-01-13 DIAGNOSIS — Z780 Asymptomatic menopausal state: Secondary | ICD-10-CM | POA: Diagnosis not present

## 2023-01-13 DIAGNOSIS — M8589 Other specified disorders of bone density and structure, multiple sites: Secondary | ICD-10-CM | POA: Diagnosis not present

## 2023-01-13 DIAGNOSIS — Z13820 Encounter for screening for osteoporosis: Secondary | ICD-10-CM | POA: Diagnosis not present

## 2023-01-14 DIAGNOSIS — H2512 Age-related nuclear cataract, left eye: Secondary | ICD-10-CM | POA: Diagnosis not present

## 2023-01-14 DIAGNOSIS — H401112 Primary open-angle glaucoma, right eye, moderate stage: Secondary | ICD-10-CM | POA: Diagnosis not present

## 2023-01-14 DIAGNOSIS — H401232 Low-tension glaucoma, bilateral, moderate stage: Secondary | ICD-10-CM | POA: Diagnosis not present

## 2023-03-17 DIAGNOSIS — H2512 Age-related nuclear cataract, left eye: Secondary | ICD-10-CM | POA: Diagnosis not present

## 2023-03-17 DIAGNOSIS — H401232 Low-tension glaucoma, bilateral, moderate stage: Secondary | ICD-10-CM | POA: Diagnosis not present

## 2023-03-17 DIAGNOSIS — H53002 Unspecified amblyopia, left eye: Secondary | ICD-10-CM | POA: Diagnosis not present

## 2023-07-17 ENCOUNTER — Emergency Department

## 2023-07-17 ENCOUNTER — Other Ambulatory Visit: Payer: Self-pay

## 2023-07-17 ENCOUNTER — Emergency Department
Admission: EM | Admit: 2023-07-17 | Discharge: 2023-07-17 | Disposition: A | Discharge status: Home / Self Care | Attending: Emergency Medicine | Admitting: Emergency Medicine

## 2023-07-17 DIAGNOSIS — J45909 Unspecified asthma, uncomplicated: Secondary | ICD-10-CM | POA: Insufficient documentation

## 2023-07-17 DIAGNOSIS — U071 COVID-19: Secondary | ICD-10-CM | POA: Insufficient documentation

## 2023-07-17 DIAGNOSIS — R42 Dizziness and giddiness: Secondary | ICD-10-CM

## 2023-07-17 DIAGNOSIS — I10 Essential (primary) hypertension: Secondary | ICD-10-CM | POA: Diagnosis not present

## 2023-07-17 DIAGNOSIS — J189 Pneumonia, unspecified organism: Secondary | ICD-10-CM | POA: Diagnosis not present

## 2023-07-17 DIAGNOSIS — R911 Solitary pulmonary nodule: Secondary | ICD-10-CM | POA: Insufficient documentation

## 2023-07-17 LAB — BASIC METABOLIC PANEL
Anion gap: 11 (ref 5–15)
BUN: 19 mg/dL (ref 8–23)
CO2: 26 mmol/L (ref 22–32)
Calcium: 9.3 mg/dL (ref 8.9–10.3)
Chloride: 96 mmol/L — ABNORMAL LOW (ref 98–111)
Creatinine, Ser: 0.83 mg/dL (ref 0.44–1.00)
GFR, Estimated: >60 mL/min (ref >60)
Glucose, Bld: 128 mg/dL — ABNORMAL HIGH (ref 70–99)
Potassium: 4 mmol/L (ref 3.5–5.1)
Sodium: 133 mmol/L — ABNORMAL LOW (ref 135–145)

## 2023-07-17 LAB — CBC
HCT: 45.2 % (ref 36.0–46.0)
Hemoglobin: 14.9 g/dL (ref 12.0–15.0)
MCH: 28.6 pg (ref 26.0–34.0)
MCHC: 33 g/dL (ref 30.0–36.0)
MCV: 86.8 fL (ref 80.0–100.0)
Platelets: 383 10*3/uL (ref 150–400)
RBC: 5.21 MIL/uL — ABNORMAL HIGH (ref 3.87–5.11)
RDW: 12.9 % (ref 11.5–15.5)
WBC: 8.8 10*3/uL (ref 4.0–10.5)
nRBC: 0 % (ref 0.0–0.2)

## 2023-07-17 LAB — HEPATIC FUNCTION PANEL
ALT: 16 U/L (ref 0–44)
AST: 19 U/L (ref 15–41)
Albumin: 4 g/dL (ref 3.5–5.0)
Alkaline Phosphatase: 80 U/L (ref 38–126)
Bilirubin, Direct: <0.1 mg/dL (ref 0.0–0.2)
Total Bilirubin: 0.9 mg/dL (ref 0.0–1.2)
Total Protein: 7.9 g/dL (ref 6.5–8.1)

## 2023-07-17 LAB — RESP PANEL BY RT-PCR (RSV, FLU A&B, COVID)  RVPGX2
Influenza A by PCR: NEGATIVE
Influenza B by PCR: NEGATIVE
Resp Syncytial Virus by PCR: NEGATIVE
SARS Coronavirus 2 by RT PCR: POSITIVE — AB

## 2023-07-17 LAB — TROPONIN I (HIGH SENSITIVITY): Troponin I (High Sensitivity): 7 ng/L (ref <18)

## 2023-07-17 MED ORDER — DOXYCYCLINE MONOHYDRATE 100 MG PO TABS: 100.0000 mg | ORAL_TABLET | Freq: Two times a day (BID) | ORAL | 0 refills | Status: AC

## 2023-07-17 MED ORDER — IOHEXOL 350 MG/ML SOLN
75.0000 mL | Freq: Once | INTRAVENOUS | Status: AC | PRN
  Administered 2023-07-17: 75 mL via INTRAVENOUS

## 2023-07-17 MED ORDER — LACTATED RINGERS IV BOLUS
1000.0000 mL | Freq: Once | INTRAVENOUS | Status: AC
  Administered 2023-07-17: 1000 mL via INTRAVENOUS

## 2023-07-17 MED ORDER — MECLIZINE HCL 25 MG PO TABS
25.0000 mg | ORAL_TABLET | Freq: Once | ORAL | Status: AC
  Administered 2023-07-17: 25 mg via ORAL
  Filled 2023-07-17: qty 1

## 2023-07-17 MED ORDER — MECLIZINE HCL 25 MG PO TABS: 25.0000 mg | ORAL_TABLET | Freq: Three times a day (TID) | ORAL | 0 refills | Status: AC | PRN

## 2023-07-17 MED ORDER — DOXYCYCLINE HYCLATE 100 MG PO TABS
100.0000 mg | ORAL_TABLET | Freq: Once | ORAL | Status: AC
  Administered 2023-07-17: 100 mg via ORAL
  Filled 2023-07-17: qty 1

## 2023-07-17 NOTE — Discharge Instructions (Addendum)
 You were seen in the emergency department for dizziness.  You were diagnosed with COVID.  You had an MRI that did not show a sign of a stroke.  Concerned that your dizziness is coming from any urinary ear problem.  It improved with meclizine.  You are given a prescription for meclizine which is for dizziness.  You are also given information of how to perform Epley maneuvers which can help with your dizziness.  You are given information to follow-up with the ear nose and throat specialist.  Follow-up with your primary care physician, call tomorrow.  Return to the emergency department for any ongoing or worsening symptoms.  You had an incidental finding of pulmonary nodule on your CT scan of your chest, have your primary care follow-up to have repeat CT scan in the future.  You are started on antibiotics for possible pneumonia on your CT scan.  Doxycycline - This medication can cause acid reflux.  It is important that you take it with food and drink plenty of water.  Do not lie down for 1 hour after taking this medication.  It also causes sun sensitivity so stay out of the sun or wear SPF while on this medication.

## 2023-07-17 NOTE — ED Triage Notes (Signed)
 Pt to ED via POV from home. Pt dizziness the last few days and possible exposure to COVID. Pt report cough and diarrhea. Pt also reports intermittent CP.

## 2023-07-17 NOTE — ED Provider Notes (Signed)
 [facility] Provider Note    Event Date/Time   First MD Initiated Contact with Patient 07/17/23 1601     (approximate)   History   Dizziness   HPI  Marie Pham is a 78 y.o. female past medical history significant for hypertension, asthma, sleep apnea, who presents to the emergency department with dizziness.  Patient states that she has been feeling dizzy for the past 1 month.  States that it acutely worsened over the past 1 week.  States that she feels like the room is spinning and like she is going to fall.  States that she has been having trouble with her gait and feeling like she is drink liquor when she walks.  States that she normally ambulates with a walker.  Denies any falls or head injury.  States that her dizziness is worse whenever she goes to move her head or her eyes or goes to stand.  History of vertigo in the past but states that its never been this bad.  States that she initially thought it was her antihypertensive medications.  States that she was told by her primary care provider to increase the dose.  Endorses some mild decreased p.o. intake.  Intermittent episodes of chest pain but denies any chest pain at this time.  Denies shortness of breath or abdominal pain.  Denies nausea, vomiting or any diarrhea.  Denies blood in her stool. 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     Physical Exam   Triage Vital Signs: ED Triage Vitals [07/17/23 1558]  Encounter Vitals Group     BP 135/82     Systolic BP Percentile      Diastolic BP Percentile      Pulse Rate (!) 115     Resp 20     Temp 98.2 F (36.8 C)     Temp Source Oral     SpO2 94 %     Weight      Height      Head Circumference      Peak Flow      Pain Score 4     Pain Loc      Pain Education      Exclude from Growth Chart     Most recent vital signs: Vitals:   07/17/23 1800 07/17/23 2100  BP: (!) 149/86 (!) 164/115  Pulse: (!) 103 (!) 102  Resp: 16 20  Temp:    SpO2:  99% 95%    Physical Exam Constitutional:      Appearance: She is well-developed.  HENT:     Head: Atraumatic.     Right Ear: Tympanic membrane normal.     Left Ear: Tympanic membrane normal.  Eyes:     Conjunctiva/sclera: Conjunctivae normal.  Cardiovascular:     Rate and Rhythm: Regular rhythm. Tachycardia present.  Pulmonary:     Effort: No respiratory distress.  Abdominal:     General: There is no distension.     Tenderness: There is no abdominal tenderness.  Musculoskeletal:        General: Normal range of motion.     Cervical back: Normal range of motion.  Skin:    General: Skin is warm.     Capillary Refill: Capillary refill takes less than 2 seconds.  Neurological:     Mental Status: She is alert. Mental status is at baseline.     GCS: GCS eye subscore is 4. GCS verbal subscore is 5. GCS motor  subscore is 6.     Cranial Nerves: Cranial nerves 2-12 are intact.     Sensory: Sensation is intact.     Motor: Motor function is intact.     Coordination: Finger-Nose-Finger Test normal.     Gait: Gait abnormal.     Comments: Difficulty with gait when ambulating with her walker     IMPRESSION / MDM / ASSESSMENT AND PLAN / ED COURSE  I reviewed the triage vital signs and the nursing notes.  Differential diagnosis of peripheral vertigo -  BPPV, lower suspicion for vestibular neuritis.  No ringing in her ears or change in hearing, doubt Mnire's disease.  Possible central vertigo -patient hypertensive, does have risk factors and giving that has been ongoing for the past 1 month.  Plan for CTA head and neck and MRI.  Will order meclizine and 1 L of IV fluids.  EKG  I, Corena Herter, the attending physician, personally viewed and interpreted this ECG.  EKG showed sinus tachycardia with a heart rate of 115.  QRS wide at 156.  QTc 447.  Right bundle branch block present.  Mild increase of rate when compared to prior EKG but otherwise unchanged with known right bundle branch  block.  Sinus tachycardia while on cardiac telemetry  RADIOLOGY I independently reviewed imaging, my interpretation of imaging: Chest x-ray with no acute findings.  Read as no acute findings.  MRI without findings of acute CVA.  CTA without acute findings of dissection or large vessel cutoff.  Multiple incidental findings which were discussed with the patient.  LABS (all labs ordered are listed, but only abnormal results are displayed) Labs interpreted as -    Labs Reviewed  RESP PANEL BY RT-PCR (RSV, FLU A&B, COVID)  RVPGX2 - Abnormal; Notable for the following components:      Result Value   SARS Coronavirus 2 by RT PCR POSITIVE (*)    All other components within normal limits  BASIC METABOLIC PANEL - Abnormal; Notable for the following components:   Sodium 133 (*)    Chloride 96 (*)    Glucose, Bld 128 (*)    All other components within normal limits  CBC - Abnormal; Notable for the following components:   RBC 5.21 (*)    All other components within normal limits  HEPATIC FUNCTION PANEL  URINALYSIS, W/ REFLEX TO CULTURE (INFECTION SUSPECTED)  TROPONIN I (HIGH SENSITIVITY)     MDM  Tested positive for COVID.  CT scan with questionable findings of pneumonia.  Will start on doxycycline for possible postviral pneumonia.  Given first dose of doxycycline in the emergency department.  CTA and MRI without findings of central cause of the patient's vertigo.  Patient was given meclizine in the emergency department and had significant improvement of her symptoms.  Able to ambulate with her walker.  Able to move her head without dizziness.  Discharged home on doxycycline and meclizine.  Given information to follow-up with ear nose and throat and primary care.  Discussed return to the emergency department for any ongoing or worsening symptoms.  No questions at time of discharge.     PROCEDURES:  Critical Care performed: No  Procedures  Patient's presentation is most consistent  with acute presentation with potential threat to life or bodily function.   MEDICATIONS ORDERED IN ED: Medications  doxycycline (VIBRA-TABS) tablet 100 mg (has no administration in time range)  lactated ringers bolus 1,000 mL (1,000 mLs Intravenous New Bag/Given 07/17/23 1715)  meclizine (ANTIVERT) tablet 25  mg (25 mg Oral Given 07/17/23 1707)  iohexol (OMNIPAQUE) 350 MG/ML injection 75 mL (75 mLs Intravenous Contrast Given 07/17/23 1850)    FINAL CLINICAL IMPRESSION(S) / ED DIAGNOSES   Final diagnoses:  Vertigo  Dizziness  COVID  Community acquired pneumonia, unspecified laterality  Pulmonary nodule     Rx / DC Orders   ED Discharge Orders          Ordered    doxycycline (ADOXA) 100 MG tablet  2 times daily        07/17/23 2113    meclizine (ANTIVERT) 25 MG tablet  3 times daily PRN        07/17/23 2113             Note:  This document was prepared using Dragon voice recognition software and may include unintentional dictation errors.   Corena Herter, MD 07/17/23 2116

## 2023-09-07 ENCOUNTER — Other Ambulatory Visit: Payer: Self-pay | Admitting: Specialist

## 2023-09-07 DIAGNOSIS — R918 Other nonspecific abnormal finding of lung field: Secondary | ICD-10-CM

## 2024-03-07 ENCOUNTER — Other Ambulatory Visit: Payer: Self-pay | Admitting: Specialist

## 2024-03-07 DIAGNOSIS — R918 Other nonspecific abnormal finding of lung field: Secondary | ICD-10-CM

## 2024-03-15 ENCOUNTER — Ambulatory Visit: Attending: Specialist

## 2024-03-17 ENCOUNTER — Emergency Department

## 2024-03-17 ENCOUNTER — Inpatient Hospital Stay
Admission: EM | Admit: 2024-03-17 | Discharge: 2024-04-04 | DRG: 064 | Disposition: A | Discharge status: Skilled Nursing Facility | Attending: Hospitalist | Admitting: Hospitalist

## 2024-03-17 ENCOUNTER — Other Ambulatory Visit: Payer: Self-pay

## 2024-03-17 DIAGNOSIS — I639 Cerebral infarction, unspecified: Principal | ICD-10-CM | POA: Diagnosis present

## 2024-03-17 DIAGNOSIS — R509 Fever, unspecified: Secondary | ICD-10-CM

## 2024-03-17 DIAGNOSIS — N179 Acute kidney failure, unspecified: Secondary | ICD-10-CM

## 2024-03-17 DIAGNOSIS — I10 Essential (primary) hypertension: Secondary | ICD-10-CM | POA: Diagnosis present

## 2024-03-17 DIAGNOSIS — R9431 Abnormal electrocardiogram [ECG] [EKG]: Secondary | ICD-10-CM

## 2024-03-17 DIAGNOSIS — R6510 Systemic inflammatory response syndrome (SIRS) of non-infectious origin without acute organ dysfunction: Secondary | ICD-10-CM

## 2024-03-17 DIAGNOSIS — I350 Nonrheumatic aortic (valve) stenosis: Secondary | ICD-10-CM

## 2024-03-17 DIAGNOSIS — E870 Hyperosmolality and hypernatremia: Secondary | ICD-10-CM

## 2024-03-17 DIAGNOSIS — J984 Other disorders of lung: Secondary | ICD-10-CM

## 2024-03-17 LAB — URINALYSIS, W/ REFLEX TO CULTURE (INFECTION SUSPECTED)
Bacteria, UA: NONE SEEN
Bilirubin Urine: NEGATIVE
Glucose, UA: NEGATIVE mg/dL
Hgb urine dipstick: NEGATIVE
Ketones, ur: 5 mg/dL — AB
Leukocytes,Ua: NEGATIVE
Nitrite: NEGATIVE
Protein, ur: NEGATIVE mg/dL
Specific Gravity, Urine: 1.041 — ABNORMAL HIGH (ref 1.005–1.030)
pH: 5 (ref 5.0–8.0)

## 2024-03-17 LAB — CBC WITH DIFFERENTIAL/PLATELET
Abs Immature Granulocytes: 0.03 K/uL (ref 0.00–0.07)
Basophils Absolute: 0 K/uL (ref 0.0–0.1)
Basophils Relative: 0 %
Eosinophils Absolute: 0 K/uL (ref 0.0–0.5)
Eosinophils Relative: 0 %
HCT: 47.4 % — ABNORMAL HIGH (ref 36.0–46.0)
Hemoglobin: 15.4 g/dL — ABNORMAL HIGH (ref 12.0–15.0)
Immature Granulocytes: 0 %
Lymphocytes Relative: 30 %
Lymphs Abs: 2.8 K/uL (ref 0.7–4.0)
MCH: 28.7 pg (ref 26.0–34.0)
MCHC: 32.5 g/dL (ref 30.0–36.0)
MCV: 88.3 fL (ref 80.0–100.0)
Monocytes Absolute: 0.7 K/uL (ref 0.1–1.0)
Monocytes Relative: 8 %
Neutro Abs: 5.8 K/uL (ref 1.7–7.7)
Neutrophils Relative %: 62 %
Platelets: 363 K/uL (ref 150–400)
RBC: 5.37 MIL/uL — ABNORMAL HIGH (ref 3.87–5.11)
RDW: 13.8 % (ref 11.5–15.5)
WBC: 9.4 K/uL (ref 4.0–10.5)
nRBC: 0 % (ref 0.0–0.2)

## 2024-03-17 LAB — COMPREHENSIVE METABOLIC PANEL WITH GFR
ALT: 12 U/L (ref 0–44)
AST: 22 U/L (ref 15–41)
Albumin: 4.5 g/dL (ref 3.5–5.0)
Alkaline Phosphatase: 117 U/L (ref 38–126)
Anion gap: 14 (ref 5–15)
BUN: 25 mg/dL — ABNORMAL HIGH (ref 8–23)
CO2: 25 mmol/L (ref 22–32)
Calcium: 9.8 mg/dL (ref 8.9–10.3)
Chloride: 107 mmol/L (ref 98–111)
Creatinine, Ser: 1.04 mg/dL — ABNORMAL HIGH (ref 0.44–1.00)
GFR, Estimated: 56 mL/min — ABNORMAL LOW (ref >60)
Glucose, Bld: 135 mg/dL — ABNORMAL HIGH (ref 70–99)
Potassium: 4 mmol/L (ref 3.5–5.1)
Sodium: 147 mmol/L — ABNORMAL HIGH (ref 135–145)
Total Bilirubin: 0.7 mg/dL (ref 0.0–1.2)
Total Protein: 8.3 g/dL — ABNORMAL HIGH (ref 6.5–8.1)

## 2024-03-17 LAB — LIPASE, BLOOD: Lipase: 42 U/L (ref 11–51)

## 2024-03-17 MED ORDER — IOHEXOL 300 MG/ML  SOLN
100.0000 mL | Freq: Once | INTRAMUSCULAR | Status: AC | PRN
  Administered 2024-03-17: 100 mL via INTRAVENOUS

## 2024-03-17 MED ORDER — IOHEXOL 350 MG/ML SOLN
75.0000 mL | Freq: Once | INTRAVENOUS | Status: AC | PRN
  Administered 2024-03-17: 75 mL via INTRAVENOUS

## 2024-03-17 MED ORDER — HYDROCHLOROTHIAZIDE 12.5 MG PO TABS
12.5000 mg | ORAL_TABLET | ORAL | Status: DC
  Filled 2024-03-17: qty 1

## 2024-03-17 MED ORDER — ONDANSETRON HCL 4 MG/2ML IJ SOLN
4.0000 mg | Freq: Once | INTRAMUSCULAR | Status: AC
  Administered 2024-03-17: 4 mg via INTRAVENOUS
  Filled 2024-03-17: qty 2

## 2024-03-17 MED ORDER — ASPIRIN 300 MG RE SUPP
300.0000 mg | Freq: Once | RECTAL | Status: AC
  Administered 2024-03-18: 300 mg via RECTAL
  Filled 2024-03-17: qty 1

## 2024-03-17 MED ORDER — LOSARTAN POTASSIUM 50 MG PO TABS
50.0000 mg | ORAL_TABLET | ORAL | Status: DC
  Filled 2024-03-17: qty 1

## 2024-03-17 MED ORDER — SODIUM CHLORIDE 0.9 % IV BOLUS
1000.0000 mL | Freq: Once | INTRAVENOUS | Status: AC
  Administered 2024-03-17: 1000 mL via INTRAVENOUS

## 2024-03-17 MED ORDER — DILTIAZEM HCL 25 MG/5ML IV SOLN
15.0000 mg | Freq: Once | INTRAVENOUS | Status: AC
  Administered 2024-03-17: 15 mg via INTRAVENOUS
  Filled 2024-03-17: qty 5

## 2024-03-17 NOTE — ED Triage Notes (Signed)
 Pt comes in via pov with complaints of chronic back pain. Pt endorses that her back hurts, but doesn't give a pain scale. PT not verbally answering questions, but knods and shakes her head in response.Her daughter is with her in triage and answering questions for her. Pt has a history of depression, and reportedly takes her medications on an empty stomach.

## 2024-03-17 NOTE — ED Notes (Signed)
 Fall Bundle in place

## 2024-03-17 NOTE — ED Provider Notes (Addendum)
 [facility] Provider Note    Event Date/Time   First MD Initiated Contact with Patient 03/17/24 1531     (approximate)   History   Chief Complaint: Back Pain   HPI  ZALEIGH Pham is a 78 y.o. female with a history of hypertension, depression who is brought to the ED by the daughter complaining of worsening of her chronic back pain.  Daughter wonders if patient's depression is worse.  Has had poor oral intake for the last several days.  No vomiting or diarrhea.  No fever.  Denies chest pain.  Daughter reports that patient urinates in a depends        Past Medical History:  Diagnosis Date   Asthma    Depression    Hypertension    Sleep apnea     Current Outpatient Rx   Order #: [id number] Class: Historical Med   Order #: [id number] Class: Normal   Order #: [id number] Class: Historical Med   Order #: [id number] Class: Historical Med   Order #: [id number] Class: Historical Med   Order #: [id number] Class: Historical Med   Order #: [id number] Class: Normal   Order #: [id number] Class: Normal   Order #: [id number] Class: Normal   Order #: [id number] Class: Normal    Past Surgical History:  Procedure Laterality Date   ABDOMINAL HYSTERECTOMY     COLON SURGERY     EYE SURGERY Left    HERNIA REPAIR      Physical Exam   Triage Vital Signs: ED Triage Vitals  Encounter Vitals Group     BP 03/17/24 1508 (!) 129/99     Girls Systolic BP Percentile --      Girls Diastolic BP Percentile --      Boys Systolic BP Percentile --      Boys Diastolic BP Percentile --      Pulse Rate 03/17/24 1508 (!) 115     Resp 03/17/24 1508 18     Temp 03/17/24 1508 98.7 F (37.1 C)     Temp src --      SpO2 03/17/24 1508 95 %     Weight 03/17/24 1509 184 lb 1.4 oz (83.5 kg)     Height 03/17/24 1509 5' 5 (1.651 m)     Head Circumference --      Peak Flow --      Pain Score --      Pain Loc --      Pain Education --      Exclude from Growth Chart --     Most recent vital  signs: Vitals:   03/17/24 2133 03/17/24 2224  BP: (!) 169/89 (!) 156/55  Pulse:  (!) 115  Resp:  (!) 25  Temp:  98.9 F (37.2 C)  SpO2:  100%    General: Awake, no distress.  CV:  Good peripheral perfusion.  Tachycardia heart rate 110 Resp:  Normal effort.  Clear lungs Abd:  No distention.  Soft with left lower quadrant and suprapubic tenderness Other:  Dry oral mucosa   ED Results / Procedures / Treatments   Labs (all labs ordered are listed, but only abnormal results are displayed) Labs Reviewed  COMPREHENSIVE METABOLIC PANEL WITH GFR - Abnormal; Notable for the following components:      Result Value   Sodium 147 (*)    Glucose, Bld 135 (*)    BUN 25 (*)    Creatinine, Ser 1.04 (*)    Total Protein 8.3 (*)    GFR, Estimated 56 (*)  All other components within normal limits  CBC WITH DIFFERENTIAL/PLATELET - Abnormal; Notable for the following components:   RBC 5.37 (*)    Hemoglobin 15.4 (*)    HCT 47.4 (*)    All other components within normal limits  URINALYSIS, W/ REFLEX TO CULTURE (INFECTION SUSPECTED) - Abnormal; Notable for the following components:   Color, Urine YELLOW (*)    APPearance CLEAR (*)    Specific Gravity, Urine 1.041 (*)    Ketones, ur 5 (*)    All other components within normal limits  LIPASE, BLOOD     EKG Interpreted by me Sinus tachycardia rate 110.  Right axis, right bundle branch block.  No acute ischemic changes.   RADIOLOGY CT head interpreted by me, negative for mass or intracranial hemorrhage.  Radiology report reviewed noting basal ganglia infarct.  CT abdomen pelvis unremarkable.   PROCEDURES:  Procedures   MEDICATIONS ORDERED IN ED: Medications  sodium chloride  0.9 % bolus 1,000 mL (0 mLs Intravenous Stopped 03/17/24 1725)  ondansetron  (ZOFRAN ) injection 4 mg (4 mg Intravenous Given 03/17/24 1621)  iohexol  (OMNIPAQUE ) 300 MG/ML solution 100 mL (100 mLs Intravenous Contrast Given 03/17/24 1732)  diltiazem   (CARDIZEM ) injection 15 mg (15 mg Intravenous Given 03/17/24 2118)  iohexol  (OMNIPAQUE ) 350 MG/ML injection 75 mL (75 mLs Intravenous Contrast Given 03/17/24 2102)     IMPRESSION / MDM / ASSESSMENT AND PLAN / ED COURSE  I reviewed the triage vital signs and the nursing notes.  DDx: Dehydration, AKI, anemia, electrolyte derangement, diverticulitis, UTI, worsening depression  Patient's presentation is most consistent with acute presentation with potential threat to life or bodily function.  Patient presents with poor oral intake, dehydration.  Exam reveals lower abdominal tenderness.  Will obtain labs, CT, give IV fluids for hydration.   Clinical Course as of 03/17/24 2315  Fri Mar 17, 2024  2055 Additional history from family is that patient has become aphasic over the last 2 or 3 days and stopped eating.  Neurologic exam reveals mild drift in the right arm, severe drift in the right leg, aphasia, concerning for left MCA stroke.  Will obtain CTA head neck and plan for admission.  Also now has tachycardia with heart rate of about 122, looks like atrial flutter on the monitor.  Will obtain x-ray and try diltiazem . [PS]    Clinical Course User Index [PS] Viviann Pastor, MD    ----------------------------------------- 10:47 PM on 03/17/2024 ----------------------------------------- CT angiogram head neck negative for hemorrhage, aneurysm, acute occlusion.  Does show basal ganglia infarct.  ----------------------------------------- 11:15 PM on 03/17/2024 ----------------------------------------- Case discussed with hospitalist   FINAL CLINICAL IMPRESSION(S) / ED DIAGNOSES   Final diagnoses:  Acute ischemic stroke (HCC)     Rx / DC Orders   ED Discharge Orders     None        Note:  This document was prepared using Dragon voice recognition software and may include unintentional dictation errors.   Viviann Pastor, MD 03/17/24 2249    Viviann Pastor,  MD 03/17/24 [phone]

## 2024-03-18 ENCOUNTER — Observation Stay

## 2024-03-18 ENCOUNTER — Observation Stay
Admit: 2024-03-18 | Discharge: 2024-03-18 | Disposition: A | Discharge status: Home / Self Care | Attending: Internal Medicine | Admitting: Internal Medicine

## 2024-03-18 DIAGNOSIS — I6389 Other cerebral infarction: Secondary | ICD-10-CM | POA: Diagnosis not present

## 2024-03-18 DIAGNOSIS — R6510 Systemic inflammatory response syndrome (SIRS) of non-infectious origin without acute organ dysfunction: Secondary | ICD-10-CM

## 2024-03-18 DIAGNOSIS — I639 Cerebral infarction, unspecified: Secondary | ICD-10-CM | POA: Diagnosis not present

## 2024-03-18 DIAGNOSIS — I63522 Cerebral infarction due to unspecified occlusion or stenosis of left anterior cerebral artery: Secondary | ICD-10-CM | POA: Diagnosis not present

## 2024-03-18 DIAGNOSIS — R9431 Abnormal electrocardiogram [ECG] [EKG]: Secondary | ICD-10-CM

## 2024-03-18 DIAGNOSIS — R000 Tachycardia, unspecified: Secondary | ICD-10-CM | POA: Diagnosis not present

## 2024-03-18 DIAGNOSIS — I10 Essential (primary) hypertension: Secondary | ICD-10-CM | POA: Diagnosis not present

## 2024-03-18 DIAGNOSIS — J984 Other disorders of lung: Secondary | ICD-10-CM | POA: Diagnosis not present

## 2024-03-18 DIAGNOSIS — R059 Cough, unspecified: Secondary | ICD-10-CM | POA: Diagnosis not present

## 2024-03-18 DIAGNOSIS — R7881 Bacteremia: Secondary | ICD-10-CM | POA: Diagnosis not present

## 2024-03-18 DIAGNOSIS — N179 Acute kidney failure, unspecified: Secondary | ICD-10-CM

## 2024-03-18 DIAGNOSIS — G9340 Encephalopathy, unspecified: Secondary | ICD-10-CM | POA: Diagnosis not present

## 2024-03-18 DIAGNOSIS — E870 Hyperosmolality and hypernatremia: Secondary | ICD-10-CM

## 2024-03-18 DIAGNOSIS — B957 Other staphylococcus as the cause of diseases classified elsewhere: Secondary | ICD-10-CM | POA: Diagnosis not present

## 2024-03-18 LAB — BASIC METABOLIC PANEL WITH GFR
Anion gap: 13 (ref 5–15)
BUN: 20 mg/dL (ref 8–23)
CO2: 20 mmol/L — ABNORMAL LOW (ref 22–32)
Calcium: 8.9 mg/dL (ref 8.9–10.3)
Chloride: 115 mmol/L — ABNORMAL HIGH (ref 98–111)
Creatinine, Ser: 0.89 mg/dL (ref 0.44–1.00)
GFR, Estimated: >60 mL/min (ref >60)
Glucose, Bld: 108 mg/dL — ABNORMAL HIGH (ref 70–99)
Potassium: 4.4 mmol/L (ref 3.5–5.1)
Sodium: 149 mmol/L — ABNORMAL HIGH (ref 135–145)

## 2024-03-18 LAB — ECHOCARDIOGRAM COMPLETE
AR max vel: 0.76 cm2
AV Area VTI: 0.53 cm2
AV Area mean vel: 0.62 cm2
AV Mean grad: 32.5 mmHg
AV Peak grad: 57.3 mmHg
Ao pk vel: 3.79 m/s
Area-P 1/2: 7.59 cm2
Height: 65 in
S' Lateral: 2.4 cm
Weight: 2945.35 [oz_av]

## 2024-03-18 LAB — LIPID PANEL
Cholesterol: 226 mg/dL — ABNORMAL HIGH (ref 0–200)
HDL: 38 mg/dL — ABNORMAL LOW (ref >40)
LDL Cholesterol: 168 mg/dL — ABNORMAL HIGH (ref 0–99)
Total CHOL/HDL Ratio: 6 ratio
Triglycerides: 103 mg/dL (ref <150)
VLDL: 21 mg/dL (ref 0–40)

## 2024-03-18 LAB — D-DIMER, QUANTITATIVE: D-Dimer, Quant: 0.92 ug{FEU}/mL — ABNORMAL HIGH (ref 0.00–0.50)

## 2024-03-18 LAB — HEMOGLOBIN A1C
Hgb A1c MFr Bld: 6.1 % — ABNORMAL HIGH (ref 4.8–5.6)
Mean Plasma Glucose: 128.37 mg/dL

## 2024-03-18 LAB — CBC
HCT: 44.9 % (ref 36.0–46.0)
Hemoglobin: 14.2 g/dL (ref 12.0–15.0)
MCH: 28.8 pg (ref 26.0–34.0)
MCHC: 31.6 g/dL (ref 30.0–36.0)
MCV: 91.1 fL (ref 80.0–100.0)
Platelets: 300 K/uL (ref 150–400)
RBC: 4.93 MIL/uL (ref 3.87–5.11)
RDW: 14.2 % (ref 11.5–15.5)
WBC: 15.7 K/uL — ABNORMAL HIGH (ref 4.0–10.5)
nRBC: 0 % (ref 0.0–0.2)

## 2024-03-18 LAB — TROPONIN T, HIGH SENSITIVITY
Troponin T High Sensitivity: 16 ng/L (ref 0–19)
Troponin T High Sensitivity: 16 ng/L (ref 0–19)

## 2024-03-18 MED ORDER — ACETAMINOPHEN 160 MG/5ML PO SOLN
650.0000 mg | ORAL | Status: DC | PRN
  Administered 2024-03-25 – 2024-03-28 (×8): 650 mg
  Filled 2024-03-18 (×10): qty 20.3

## 2024-03-18 MED ORDER — ENOXAPARIN SODIUM 40 MG/0.4ML IJ SOSY
40.0000 mg | PREFILLED_SYRINGE | INTRAMUSCULAR | Status: AC
  Administered 2024-03-18 – 2024-03-27 (×10): 40 mg via SUBCUTANEOUS
  Filled 2024-03-18 (×10): qty 0.4

## 2024-03-18 MED ORDER — ENOXAPARIN SODIUM 40 MG/0.4ML IJ SOSY
0.5000 mg/kg | PREFILLED_SYRINGE | INTRAMUSCULAR | Status: DC
Start: 2024-03-18 — End: 2024-03-18
  Filled 2024-03-18: qty 0.8

## 2024-03-18 MED ORDER — ASPIRIN 300 MG RE SUPP
300.0000 mg | Freq: Every day | RECTAL | Status: DC
  Administered 2024-03-19: 300 mg via RECTAL
  Filled 2024-03-18: qty 1

## 2024-03-18 MED ORDER — ACETAMINOPHEN 325 MG PO TABS
650.0000 mg | ORAL_TABLET | ORAL | Status: DC | PRN
  Administered 2024-03-23 – 2024-03-28 (×3): 650 mg via ORAL
  Filled 2024-03-18 (×4): qty 2

## 2024-03-18 MED ORDER — SODIUM CHLORIDE 0.9 % IV BOLUS
500.0000 mL | Freq: Once | INTRAVENOUS | Status: AC
  Administered 2024-03-18: 500 mL via INTRAVENOUS

## 2024-03-18 MED ORDER — SODIUM CHLORIDE 0.9 % IV SOLN: INTRAVENOUS | Status: DC

## 2024-03-18 MED ORDER — SODIUM CHLORIDE 0.9 % IV SOLN: INTRAVENOUS | Status: AC

## 2024-03-18 MED ORDER — SODIUM CHLORIDE 0.9 % IV SOLN
1.0000 g | INTRAVENOUS | Status: DC
  Administered 2024-03-18 – 2024-03-22 (×5): 1 g via INTRAVENOUS
  Filled 2024-03-18 (×5): qty 10

## 2024-03-18 MED ORDER — SODIUM CHLORIDE 0.45 % IV SOLN: INTRAVENOUS | Status: AC

## 2024-03-18 MED ORDER — STROKE: EARLY STAGES OF RECOVERY BOOK: Freq: Once | Status: AC

## 2024-03-18 MED ORDER — IOHEXOL 350 MG/ML SOLN
75.0000 mL | Freq: Once | INTRAVENOUS | Status: AC | PRN
  Administered 2024-03-18: 75 mL via INTRAVENOUS

## 2024-03-18 MED ORDER — ACETAMINOPHEN 650 MG RE SUPP
650.0000 mg | RECTAL | Status: DC | PRN
  Administered 2024-03-18 – 2024-03-24 (×2): 650 mg via RECTAL
  Filled 2024-03-18 (×2): qty 1

## 2024-03-18 NOTE — Assessment & Plan Note (Signed)
 Secondary to dehydration Being hydrated

## 2024-03-18 NOTE — Assessment & Plan Note (Signed)
 Holding antihypertensives to allow for permissive hypertension up to 48 hours

## 2024-03-18 NOTE — Progress Notes (Signed)
 Progress Note    Marie Pham  FMW:[id number] DOB: May 20, 1945  DOA: 03/17/2024 PCP: Tobie Domino, MD      Brief Narrative:    Medical records reviewed and are as summarized below:  Marie Pham is a 78 y.o. female with medical history significant for hypertension, chronic back pain, arthritis, sleep apnea, depression, asthma/bronchitis, ambulates with a walker at baseline who presented to the hospital because of poor oral intake and speech difficulties.  Family had tried to communicate with her and she appeared to understand what her son was saying but she could not respond.  Onset or duration of speech difficulty is not clear.  In the ED, apparently patient indicated that her back hurts.  Vitals in the ED: Temperature 98.7 F, respiratory rate 18, pulse 115, BP 120/99 oxygen saturation 95% on room air.  Troponins negative, mildly elevated D-dimer 0.92. EKG shows sinus tachycardia, right bundle branch block, prolonged QTc at 494  CT angiogram of head and neck showed acute left basal ganglia infarct.  Chest x-ray 1. Linear atelectasis in the left base and right mid lung, which may contribute to shortness of breath. 2. No acute cardiopulmonary process identified.   She was admitted to the hospital for acute stroke.   Assessment/Plan:   Principal Problem:   Acute CVA (cerebrovascular accident) (HCC) Active Problems:   AKI (acute kidney injury)   Hypernatremia   SIRS (systemic inflammatory response syndrome) (HCC)   Abnormal EKG   Essential hypertension    Body mass index is 30.63 kg/m.  (Class I obesity)    Acute stroke with right facial droop, right hemiparesis and aphasia: MRI brain has been ordered for further evaluation.  Consulted Dr. Lindzen, neurologist, to assist with management. Patient was lethargic this morning and speech therapist today assessment.  She will be kept n.p.o. for now. 2D echo is pending.  Continue aspirin  per rectum. PT, OT  evaluation   Right lung opacity concerning for atelectasis but cannot rule out aspiration pneumonia, sinus tachycardia: Start IV ceftriaxone    SIRS, tachycardia, tachypnea: Probably due to dehydration.  Unable to rule out aspiration pneumonia so she has been started on antibiotics.   Hypernatremia, dehydration: Sodium up from 146-149.  Start IV half-normal saline infusion and monitor BMP.   AKI: Improved   Hypertension: Hold off on antihypertensives   Chronic back pain  Diet Order             Diet NPO time specified  Diet effective now                                  Consultants: Neurologist  Procedures: None    Medications:    [START ON 03/19/2024]  stroke: early stages of recovery book   Does not apply Once   enoxaparin  (LOVENOX ) injection  40 mg Subcutaneous Q24H   Continuous Infusions:  sodium chloride      cefTRIAXone  (ROCEPHIN )  IV 1 g (03/18/24 1034)     Anti-infectives (From admission, onward)    Start     Dose/Rate Route Frequency Ordered Stop   03/18/24 1045  cefTRIAXone  (ROCEPHIN ) 1 g in sodium chloride  0.9 % 100 mL IVPB        1 g 200 mL/hr over 30 Minutes Intravenous Every 24 hours 03/18/24 0945                Family Communication/Anticipated D/C date and plan/Code Status  DVT prophylaxis: enoxaparin  (LOVENOX ) injection 40 mg Start: 03/18/24 1038     Code Status: Full Code  Family Communication: Plan discussed with family (son, daughter, daughter-in-law) Disposition Plan: Will likely need SNF at discharge   Status is: Observation The patient will require care spanning > 2 midnights and should be moved to inpatient because: Acute stroke       Subjective:   Interval events noted.  Patient is aphasic and unable to provide any history.  Family including son, daughter, daughter-in-law) are at the bedside.  Echo tech was at the bedside performing echo.  Objective:    Vitals:   03/18/24 0359 03/18/24  0544 03/18/24 0750 03/18/24 1150  BP: (!) 147/74 (!) 154/63 (!) 140/57 (!) 142/57  Pulse: (!) 119 (!) 121 (!) 120 (!) 124  Resp: 19 18 19 19   Temp: 98.7 F (37.1 C) 99.1 F (37.3 C) 98.8 F (37.1 C) 98.4 F (36.9 C)  TempSrc: Oral Oral Oral Oral  SpO2: 97% 98% 97% 93%  Weight:      Height:       No data found.   Intake/Output Summary (Last 24 hours) at 03/18/2024 1246 Last data filed at 03/17/2024 1725 Gross per 24 hour  Intake 1000 ml  Output --  Net 1000 ml   Filed Weights   03/17/24 1509  Weight: 83.5 kg    Exam:  GEN: NAD SKIN: Warm and dry EYES: No pallor or icterus, PERRLA, EOMI, opaque lens in the left eye ENT: MMM CV: RRR, tachycardic PULM: No wheezing or rales heard ABD: soft, obese, NT, +BS CNS: Lethargic, aphasic, right facial droop, right hemiparesis EXT: No edema or tenderness        Data Reviewed:   I have personally reviewed following labs and imaging studies:  Labs: Labs show the following:   Basic Metabolic Panel: Recent Labs  Lab 03/17/24 1624 03/18/24 1139  NA 147* 149*  K 4.0 4.4  CL 107 115*  CO2 25 20*  GLUCOSE 135* 108*  BUN 25* 20  CREATININE 1.04* 0.89  CALCIUM  9.8 8.9   GFR Estimated Creatinine Clearance: 58.3 mL/min (by C-G formula based on SCr of 0.89 mg/dL). Liver Function Tests: Recent Labs  Lab 03/17/24 1624  AST 22  ALT 12  ALKPHOS 117  BILITOT 0.7  PROT 8.3*  ALBUMIN 4.5   Recent Labs  Lab 03/17/24 1624  LIPASE 42   No results for input(s): AMMONIA in the last 168 hours. Coagulation profile No results for input(s): INR, PROTIME in the last 168 hours.  CBC: Recent Labs  Lab 03/17/24 1624 03/18/24 1139  WBC 9.4 15.7*  NEUTROABS 5.8  --   HGB 15.4* 14.2  HCT 47.4* 44.9  MCV 88.3 91.1  PLT 363 300   Cardiac Enzymes: No results for input(s): CKTOTAL, CKMB, CKMBINDEX, TROPONINI in the last 168 hours. BNP (last 3 results) No results for input(s): PROBNP in the last 8760  hours. CBG: No results for input(s): GLUCAP in the last 168 hours. D-Dimer: Recent Labs    03/18/24 0130  DDIMER 0.92*   Hgb A1c: Recent Labs    03/18/24 0107  HGBA1C 6.1*   Lipid Profile: Recent Labs    03/18/24 0326  CHOL 226*  HDL 38*  LDLCALC 168*  TRIG 103  CHOLHDL 6.0   Thyroid  function studies: No results for input(s): TSH, T4TOTAL, T3FREE, THYROIDAB in the last 72 hours.  Invalid input(s): FREET3 Anemia work up: No results for input(s): VITAMINB12, FOLATE, FERRITIN, TIBC,  IRON, RETICCTPCT in the last 72 hours. Sepsis Labs: Recent Labs  Lab 03/17/24 1624 03/18/24 1139  WBC 9.4 15.7*    Microbiology No results found for this or any previous visit (from the past 240 hours).  Procedures and diagnostic studies:  DG Chest Port 1 View Result Date: 03/18/2024 EXAM: 1 VIEW(S) XRAY OF THE CHEST 03/18/2024 10:29:00 AM COMPARISON: Radiographs from 07/17/2023. CTA head and neck from 03/17/2024. Radiographs from 03/18/2024 (earlier today). CLINICAL HISTORY: 78 year old female with pneumonia. FINDINGS: LUNGS AND PLEURA: Low lung volumes. Stable linear scarring or subsegmental atelectasis at left base. The right suprahilar opacity appears chronic but increased when compared to radiographs from 07/17/2023. Appearance unchanged from earlier today. CTA neck yesterday negative aside from gas trapping. No pleural effusion. No pneumothorax. HEART AND MEDIASTINUM: Aortic atherosclerosis. No acute abnormality of the cardiac and mediastinal silhouettes. BONES AND SOFT TISSUES: No acute osseous abnormality. IMPRESSION: 1. Streaky right suprahilar opacity persists, CTA neck yesterday negative aside from gas trapping. Favor atelectasis rather than developing infection. 2. No new cardiopulmonary abnormality. Electronically signed by: Helayne Hurst MD 03/18/2024 10:37 AM EST RP Workstation: [id number]   DG Chest Port 1 View Result Date: 03/18/2024 EXAM: 1 VIEW(S)  XRAY OF THE CHEST 03/18/2024 01:45:55 AM COMPARISON: 07/17/2023 CLINICAL HISTORY: Shortness of breath. FINDINGS: LIMITATIONS/ARTIFACTS: Shallow inspiration. LUNGS AND PLEURA: Linear atelectasis in the left base and right mid lung. No focal consolidation. No pleural effusion. No pneumothorax. HEART AND MEDIASTINUM: Calcification of the aorta. BONES AND SOFT TISSUES: Degenerative changes in the spine and shoulders. IMPRESSION: 1. Linear atelectasis in the left base and right mid lung, which may contribute to shortness of breath. 2. No acute cardiopulmonary process identified. Electronically signed by: Elsie Gravely MD 03/18/2024 01:52 AM EST RP Workstation: [id number]   CT ANGIO HEAD NECK W WO CM Result Date: 03/17/2024 EXAM: CTA Head and Neck with Intravenous Contrast. CT Head without Contrast. CLINICAL HISTORY: Neuro deficit, acute, stroke suspected Neuro deficit, acute, stroke suspected TECHNIQUE: Axial CTA images of the head and neck performed with intravenous contrast. MIP reconstructed images were created and reviewed. Axial computed tomography images of the head/brain performed without intravenous contrast. Note: Per PQRS, the description of internal carotid artery percent stenosis, including 0 percent or normal exam, is based on [employer] Symptomatic Carotid Endarterectomy Trial (NASCET) criteria. Dose reduction technique was used including one or more of the following: automated exposure control, adjustment of mA and kV according to patient size, and/or iterative reconstruction. CONTRAST: With; COMPARISON: CT head November 09, 2023 FINDINGS: CT HEAD: BRAIN: Limited CT head with suggestion of hypodensity in the left basal ganglia (series 2 image 14). No acute intraparenchymal hemorrhage. No mass lesion. No midline shift or extra-axial collection. VENTRICLES: No hydrocephalus. ORBITS: The orbits are unremarkable. SINUSES AND MASTOIDS: The paranasal sinuses and mastoid air cells are clear. CTA NECK:  COMMON CAROTID ARTERIES: No significant stenosis. No dissection or occlusion. INTERNAL CAROTID ARTERIES: No stenosis by NASCET criteria. No dissection or occlusion. VERTEBRAL ARTERIES: Moderate stenosis of the left vertebral artery origin. Occlusion versus severe stenosis of the nondominant distal right intradural vertebral artery. Dominant left vertebral artery is patent without significant stenosis. CTA HEAD: ANTERIOR CEREBRAL ARTERIES: No significant stenosis. No occlusion. No aneurysm. Hypoplastic right A1 ACA. MIDDLE CEREBRAL ARTERIES: Mild to moderate left M1 and proximal M2 MCA stenoses. No occlusion. No aneurysm. POSTERIOR CEREBRAL ARTERIES: Severe bilateral P2 PCA stenoses. No occlusion. No aneurysm. BASILAR ARTERY: No significant stenosis. No occlusion. No aneurysm. OTHER: SOFT TISSUES: No acute  finding. No masses or lymphadenopathy. BONES: No acute osseous abnormality. IMPRESSION: 1. Limited CT head with hypodensity in the left basal ganglia that is suspicious for acute/recent infarct. Recommend MRI to assess for acute infarct. 2. 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 3. Severe bilateral P2 PCA stenoses. 4. Moderate left vertebral artery origin stenosis. 5. Mild to moderate left M1 and M2 MCA stenoses. Electronically signed by: Gilmore Molt MD 03/17/2024 09:54 PM EST RP Workstation: [id number]   CT ABDOMEN PELVIS W CONTRAST Result Date: 03/17/2024 EXAM: CT ABDOMEN AND PELVIS WITH CONTRAST 03/17/2024 05:47:17 PM TECHNIQUE: CT of the abdomen and pelvis was performed with the administration of intravenous contrast. 100 mL of iohexol  (OMNIPAQUE ) 300 MG/ML solution was administered. Multiplanar reformatted images are provided for review. Automated exposure control, iterative reconstruction, and/or weight-based adjustment of the mA/kV was utilized to reduce the radiation dose to as low as reasonably achievable. COMPARISON: None available. CLINICAL HISTORY: LLQ  abdominal pain. FINDINGS: LOWER CHEST: Treatment artifact in the lung bases. 4 mm nodule in the right costophrenic angle. LIVER: Fatty infiltration of the liver. GALLBLADDER AND BILE DUCTS: Gallbladder is unremarkable. No biliary ductal dilatation. SPLEEN: Circumscribed low attenuation lesion in the spleen measuring 3 cm diameter. This is likely a cyst or hemangioma. PANCREAS: No acute abnormality. ADRENAL GLANDS: No acute abnormality. KIDNEYS, URETERS AND BLADDER: Bilateral renal cysts. No stones in the kidneys or ureters. No hydronephrosis. No perinephric or periureteral stranding. Urinary bladder is unremarkable. Per consensus, no follow-up is needed for simple Bosniak type 1 and 2 renal cysts, unless the patient has a malignancy history or risk factors. GI AND BOWEL: Stomach demonstrates no acute abnormality. Diverticulosis of the sigmoid colon. No evidence of acute diverticulitis. There is no bowel obstruction. PERITONEUM AND RETROPERITONEUM: No ascites. No free air. VASCULATURE: Calcification of the aorta. No aneurysm. Aorta is normal in caliber. LYMPH NODES: No lymphadenopathy. REPRODUCTIVE ORGANS: No acute abnormality. BONES AND SOFT TISSUES: Previous left hip arthroplasty. Degenerative changes in the spine. Degenerative changes in the right hip. Port. No acute osseous abnormality. No focal soft tissue abnormality. IMPRESSION: 1. No acute findings. 2. 4 mm right costophrenic angle pulmonary nodule; given incomplete chest coverage, per Fleischner Society Guidelines for incidental nodules 5 mm, no routine follow-up is recommended for low-risk or unknown-risk patients without high-risk nodule features; if high-risk for malignancy or if high-risk nodule features are present, an optional non-contrast chest CT at 12 months may be considered; if performed and stable at 12 months, no further follow-up is needed. Electronically signed by: Elsie Gravely MD 03/17/2024 05:58 PM EST RP Workstation: [id number]                LOS: 0 days   Jonnie Kubly  [facility]   Pager on [email or web address]. If 7PM-7AM, please contact night-coverage at [email or web address]     03/18/2024, 12:46 PM

## 2024-03-18 NOTE — Progress Notes (Signed)
*  PRELIMINARY RESULTS* Echocardiogram 2D Echocardiogram has been performed.  Marie Pham C Bright Spielmann 03/18/2024, 11:01 AM

## 2024-03-18 NOTE — H&P (Signed)
 History and Physical    Patient: Marie Pham FMW:[id number] DOB: [birth year] DOA: 03/17/2024 DOS: the patient was seen and examined on 03/18/2024 PCP: Tobie Domino, MD  Patient coming from: Home  Chief Complaint:  Chief Complaint  Patient presents with   Back Pain    HPI: [name] is a 78 y.o. female with medical history significant for Hypertension, arthritis, chronic back pain, ambulance at baseline with a walker being admitted for an acute CVA with last known well over 24 hours prior.  Most of the history taken from son at bedside who states that he last spoke with his mother the day prior.  Patient's niece who lives with her noted that she had not eaten all day and was not getting up and doing anything.  Son says she appeared to understand what was being said but could not respond.  He says she was able to ambulate to the car with her walker and with personal assistance and was brought to the emergency room. In the ED, BP 129/99 and pulse 115, respirations 25 with normal O2 sat but she was placed on 2 L for comfort.  Afebrile. Labs notable for mild polycythemia with hemoglobin of 15.4, hypernatremia with sodium 147 and creatinine 1.04 up from 0.83 about 8 months prior.  Urinalysis with ketones and not consistent with UTI. EKG showed sinus tachycardia at 110 with RBBB, ST T wave inversions concerning for ischemia CTA head and neck showed acute/recent left basal ganglia infarct, recommending MRI to assess for acute infarct.  Also shows multiple areas of varying levels of stenosis in the PCA and MCA territory  Patient was treated with an NS bolus and given an aspirin  suppository.  Cardizem  15 mg injection was also administered in the ED  Admission requested.     Past Medical History:  Diagnosis Date   Asthma    Depression    Hypertension    Sleep apnea    Past Surgical History:  Procedure Laterality Date   ABDOMINAL HYSTERECTOMY     COLON SURGERY     EYE SURGERY Left     HERNIA REPAIR     Social History:  reports that she quit smoking about 29 years ago. She has never used smokeless tobacco. She reports that she does not drink alcohol and does not use drugs.  Allergies  Allergen Reactions   Lansoprazole Nausea And Vomiting   Penicillins Hives   Prozac [Fluoxetine] Hives   Bupropion Rash    Family History  Problem Relation Age of Onset   Hypertension Sister    Alcohol abuse Brother    Hypertension Brother    Diabetes Brother     Prior to Admission medications   Medication Sig Start Date End Date Taking? Authorizing Provider  albuterol  (PROVENTIL  HFA;VENTOLIN  HFA) 108 (90 BASE) MCG/ACT inhaler Inhale into the lungs.    [provider]  famotidine  (PEPCID ) 40 MG tablet Take 1 tablet (40 mg total) by mouth every evening. 06/08/21 06/08/22  Arlander Charleston, MD  Fluticasone-Salmeterol (ADVAIR) 250-50 MCG/DOSE AEPB Inhale into the lungs.    [provider]  hydrochlorothiazide  (HYDRODIURIL ) 12.5 MG tablet Take by mouth. 05/29/14 05/29/15  [provider]  lisinopril (PRINIVIL,ZESTRIL) 10 MG tablet Take by mouth.    [provider]  losartan  (COZAAR ) 50 MG tablet Take by mouth. 05/01/14 01/12/16  [provider]  Menthol-Methyl Salicylate (MUSCLE RUB) 10-15 % CREA Apply 1 Application topically as needed. 11/09/22   Cyrena Mylar, MD  QUEtiapine  (SEROQUEL )  200 MG tablet Take 1 tablet (200 mg total) by mouth at bedtime. 01/01/15   Mohammed Goldstein, MD  sucralfate  (CARAFATE ) 1 g tablet Take 1 tablet (1 g total) by mouth 4 (four) times daily for 15 days. 06/08/21 06/23/21  Arlander Charleston, MD  venlafaxine  XR (EFFEXOR -XR) 150 MG 24 hr capsule i pill in am 01/01/15   Mohammed Goldstein, MD    Physical Exam: Vitals:   03/17/24 2133 03/17/24 2224 03/17/24 2333 03/18/24 0021  BP: (!) 169/89 (!) 156/55 (!) 178/92 (!) 167/84  Pulse:  (!) 115 (!) 114 (!) 111  Resp:  (!) 25 (!) 25   Temp:  98.9 F (37.2 C)    TempSrc:  Oral    SpO2:  100% 97% 95%   Weight:      Height:       Physical Exam Vitals and nursing note reviewed.  Constitutional:      General: She is not in acute distress.    Comments: Lethargic, mildly tachycardic and tachypneic.  Aphasic.  Able to follow commands  HENT:     Head: Normocephalic and atraumatic.  Cardiovascular:     Rate and Rhythm: Regular rhythm. Tachycardia present.     Heart sounds: Normal heart sounds.  Pulmonary:     Effort: Tachypnea present.     Breath sounds: Normal breath sounds.     Comments: Transmitted sounds Abdominal:     Palpations: Abdomen is soft.     Tenderness: There is no abdominal tenderness.  Neurological:     Mental Status: She is lethargic.     Comments: Patient able to track but favoring right side Slowly following commands Weakness right side Aphasic     Labs on Admission: I have personally reviewed following labs and imaging studies  CBC: Recent Labs  Lab 03/17/24 1624  WBC 9.4  NEUTROABS 5.8  HGB 15.4*  HCT 47.4*  MCV 88.3  PLT 363   Basic Metabolic Panel: Recent Labs  Lab 03/17/24 1624  NA 147*  K 4.0  CL 107  CO2 25  GLUCOSE 135*  BUN 25*  CREATININE 1.04*  CALCIUM  9.8   GFR: Estimated Creatinine Clearance: 49.9 mL/min (A) (by C-G formula based on SCr of 1.04 mg/dL (H)). Liver Function Tests: Recent Labs  Lab 03/17/24 1624  AST 22  ALT 12  ALKPHOS 117  BILITOT 0.7  PROT 8.3*  ALBUMIN 4.5   Recent Labs  Lab 03/17/24 1624  LIPASE 42   No results for input(s): AMMONIA in the last 168 hours. Coagulation Profile: No results for input(s): INR, PROTIME in the last 168 hours. Cardiac Enzymes: No results for input(s): CKTOTAL, CKMB, CKMBINDEX, TROPONINI in the last 168 hours. BNP (last 3 results) No results for input(s): PROBNP in the last 8760 hours. HbA1C: No results for input(s): HGBA1C in the last 72 hours. CBG: No results for input(s): GLUCAP in the last 168 hours. Lipid Profile: No results for input(s):  CHOL, HDL, LDLCALC, TRIG, CHOLHDL, LDLDIRECT in the last 72 hours. Thyroid  Function Tests: No results for input(s): TSH, T4TOTAL, FREET4, T3FREE, THYROIDAB in the last 72 hours. Anemia Panel: No results for input(s): VITAMINB12, FOLATE, FERRITIN, TIBC, IRON, RETICCTPCT in the last 72 hours. Urine analysis:    Component Value Date/Time   COLORURINE YELLOW (A) 03/17/2024 1906   APPEARANCEUR CLEAR (A) 03/17/2024 1906   APPEARANCEUR Hazy 05/15/2011 1627   LABSPEC 1.041 (H) 03/17/2024 1906   LABSPEC 1.011 05/15/2011 1627   PHURINE 5.0 03/17/2024 1906   GLUCOSEU  NEGATIVE 03/17/2024 1906   GLUCOSEU Negative 05/15/2011 1627   HGBUR NEGATIVE 03/17/2024 1906   BILIRUBINUR NEGATIVE 03/17/2024 1906   BILIRUBINUR Negative 05/15/2011 1627   KETONESUR 5 (A) 03/17/2024 1906   PROTEINUR NEGATIVE 03/17/2024 1906   UROBILINOGEN 1.0 06/09/2008 1748   NITRITE NEGATIVE 03/17/2024 1906   LEUKOCYTESUR NEGATIVE 03/17/2024 1906   LEUKOCYTESUR Negative 05/15/2011 1627    Radiological Exams on Admission: CT ANGIO HEAD NECK W WO CM Result Date: 03/17/2024 EXAM: CTA Head and Neck with Intravenous Contrast. CT Head without Contrast. CLINICAL HISTORY: Neuro deficit, acute, stroke suspected Neuro deficit, acute, stroke suspected TECHNIQUE: Axial CTA images of the head and neck performed with intravenous contrast. MIP reconstructed images were created and reviewed. Axial computed tomography images of the head/brain performed without intravenous contrast. Note: Per PQRS, the description of internal carotid artery percent stenosis, including 0 percent or normal exam, is based on [employer] American Symptomatic Carotid Endarterectomy Trial (NASCET) criteria. Dose reduction technique was used including one or more of the following: automated exposure control, adjustment of mA and kV according to patient size, and/or iterative reconstruction. CONTRAST: With; COMPARISON: CT head November 09, 2023  FINDINGS: CT HEAD: BRAIN: Limited CT head with suggestion of hypodensity in the left basal ganglia (series 2 image 14). No acute intraparenchymal hemorrhage. No mass lesion. No midline shift or extra-axial collection. VENTRICLES: No hydrocephalus. ORBITS: The orbits are unremarkable. SINUSES AND MASTOIDS: The paranasal sinuses and mastoid air cells are clear. CTA NECK: COMMON CAROTID ARTERIES: No significant stenosis. No dissection or occlusion. INTERNAL CAROTID ARTERIES: No stenosis by NASCET criteria. No dissection or occlusion. VERTEBRAL ARTERIES: Moderate stenosis of the left vertebral artery origin. Occlusion versus severe stenosis of the nondominant distal right intradural vertebral artery. Dominant left vertebral artery is patent without significant stenosis. CTA HEAD: ANTERIOR CEREBRAL ARTERIES: No significant stenosis. No occlusion. No aneurysm. Hypoplastic right A1 ACA. MIDDLE CEREBRAL ARTERIES: Mild to moderate left M1 and proximal M2 MCA stenoses. No occlusion. No aneurysm. POSTERIOR CEREBRAL ARTERIES: Severe bilateral P2 PCA stenoses. No occlusion. No aneurysm. BASILAR ARTERY: No significant stenosis. No occlusion. No aneurysm. OTHER: SOFT TISSUES: No acute finding. No masses or lymphadenopathy. BONES: No acute osseous abnormality. IMPRESSION: 1. Limited CT head with hypodensity in the left basal ganglia that is suspicious for acute/recent infarct. Recommend MRI to assess for acute infarct. 2. 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 3. Severe bilateral P2 PCA stenoses. 4. Moderate left vertebral artery origin stenosis. 5. Mild to moderate left M1 and M2 MCA stenoses. Electronically signed by: Gilmore Molt MD 03/17/2024 09:54 PM EST RP Workstation: [id number]   CT ABDOMEN PELVIS W CONTRAST Result Date: 03/17/2024 EXAM: CT ABDOMEN AND PELVIS WITH CONTRAST 03/17/2024 05:47:17 PM TECHNIQUE: CT of the abdomen and pelvis was performed with the administration of  intravenous contrast. 100 mL of iohexol  (OMNIPAQUE ) 300 MG/ML solution was administered. Multiplanar reformatted images are provided for review. Automated exposure control, iterative reconstruction, and/or weight-based adjustment of the mA/kV was utilized to reduce the radiation dose to as low as reasonably achievable. COMPARISON: None available. CLINICAL HISTORY: LLQ abdominal pain. FINDINGS: LOWER CHEST: Treatment artifact in the lung bases. 4 mm nodule in the right costophrenic angle. LIVER: Fatty infiltration of the liver. GALLBLADDER AND BILE DUCTS: Gallbladder is unremarkable. No biliary ductal dilatation. SPLEEN: Circumscribed low attenuation lesion in the spleen measuring 3 cm diameter. This is likely a cyst or hemangioma. PANCREAS: No acute abnormality. ADRENAL GLANDS: No acute abnormality. KIDNEYS, URETERS AND BLADDER: Bilateral  renal cysts. No stones in the kidneys or ureters. No hydronephrosis. No perinephric or periureteral stranding. Urinary bladder is unremarkable. Per consensus, no follow-up is needed for simple Bosniak type 1 and 2 renal cysts, unless the patient has a malignancy history or risk factors. GI AND BOWEL: Stomach demonstrates no acute abnormality. Diverticulosis of the sigmoid colon. No evidence of acute diverticulitis. There is no bowel obstruction. PERITONEUM AND RETROPERITONEUM: No ascites. No free air. VASCULATURE: Calcification of the aorta. No aneurysm. Aorta is normal in caliber. LYMPH NODES: No lymphadenopathy. REPRODUCTIVE ORGANS: No acute abnormality. BONES AND SOFT TISSUES: Previous left hip arthroplasty. Degenerative changes in the spine. Degenerative changes in the right hip. Port. No acute osseous abnormality. No focal soft tissue abnormality. IMPRESSION: 1. No acute findings. 2. 4 mm right costophrenic angle pulmonary nodule; given incomplete chest coverage, per Fleischner Society Guidelines for incidental nodules 5 mm, no routine follow-up is recommended for low-risk  or unknown-risk patients without high-risk nodule features; if high-risk for malignancy or if high-risk nodule features are present, an optional non-contrast chest CT at 12 months may be considered; if performed and stable at 12 months, no further follow-up is needed. Electronically signed by: Elsie Gravely MD 03/17/2024 05:58 PM EST RP Workstation: [id number]   Data Reviewed for HPI: Relevant notes from primary care and specialist visits, past discharge summaries as available in EHR, including Care Everywhere. Prior diagnostic testing as pertinent to current admission diagnoses Updated medications and problem lists for reconciliation ED course, including vitals, labs, imaging, treatment and response to treatment Triage notes, nursing and pharmacy notes and ED provider's notes Notable results as noted above in HPI      Assessment and Plan: * Acute CVA (cerebrovascular accident) [facility]) Patient with right-sided weakness, aphasia with unknown last well Permissive hypertension for first 24-48 hrs post stroke onset: Prn Labetalol IV or Vasotec IV If BP greater than 220/120  Statins for LDL goal less than 70 Rectal aspirin  being given.  Plans for ASA 81mg  daily, Plavix  75mg  daily x 3 weeks then monotherapy thereafter Telemetry, echocardiogram Holding off on MRI  for now due to risk of aspiration Avoid dextrose  containing fluids, Maintain euglycemia, euthermia Neuro checks q4 hrs x 24 hrs and then per shift Head of bed 30 degrees Physical therapy/Occupational therapy/Speech therapy if failed dysphagia screen Consider neurology consult in the a.m.   AKI (acute kidney injury) Creatinine 1.04 up from 0.83 about 8 months prior Secondary to inadequate oral intake, dehydration IV hydration Monitor renal function  SIRS (systemic inflammatory response syndrome) (HCC) Patient with tachycardia and tachypnea suspect secondary to dehydration No infectious source, no hypoxia Will get chest x-ray  to evaluate for possible aspiration-->non acute Will get D-dimer-->slightly above age adjusted threshold, but without hypoxemia Will assess response to IV fluid  Hypernatremia Secondary to dehydration Being hydrated   Abnormal EKG T wave inversions inferior leads Getting troponins-->16  Essential hypertension Holding antihypertensives to allow for permissive hypertension up to 48 hours    DVT prophylaxis: Lovenox   Consults: none  Advance Care Planning:   Code Status: Full Code   Family Communication: son at bedside  Disposition Plan: Back to previous home environment  Severity of Illness: The appropriate patient status for this patient is OBSERVATION. Observation status is judged to be reasonable and necessary in order to provide the required intensity of service to ensure the patient's safety. The patient's presenting symptoms, physical exam findings, and initial radiographic and laboratory data in the context of their medical condition is  felt to place them at decreased risk for further clinical deterioration. Furthermore, it is anticipated that the patient will be medically stable for discharge from the hospital within 2 midnights of admission.   Author: Delayne LULLA Solian, MD 03/18/2024 1:09 AM  For on call review [email or web address].

## 2024-03-18 NOTE — Assessment & Plan Note (Signed)
 Creatinine 1.04 up from 0.83 about 8 months prior Secondary to inadequate oral intake, dehydration IV hydration Monitor renal function

## 2024-03-18 NOTE — Progress Notes (Signed)
 Anticoagulation monitoring(Lovenox ):  78 yo female ordered Lovenox  40 mg Q24h    Filed Weights   03/17/24 1509  Weight: 83.5 kg (184 lb 1.4 oz)   BMI 30.6   Lab Results  Component Value Date   CREATININE 1.04 (H) 03/17/2024   CREATININE 0.83 07/17/2023   CREATININE 1.27 (H) 11/09/2022   Estimated Creatinine Clearance: 49.9 mL/min (A) (by C-G formula based on SCr of 1.04 mg/dL (H)). Hemoglobin & Hematocrit     Component Value Date/Time   HGB 15.4 (H) 03/17/2024 1624   HGB 13.0 10/25/2011 1617   HCT 47.4 (H) 03/17/2024 1624   HCT 38.7 10/25/2011 1617     Per Protocol for Patient with estCrcl > 30 ml/min and BMI > 30, will transition to Lovenox  42.5 mg Q24h.

## 2024-03-18 NOTE — Evaluation (Signed)
 Physical Therapy Evaluation Patient Details Name: Marie Pham MRN: [id number] DOB: [birth year] Today's Date: 03/18/2024  History of Present Illness  Marie Pham is a 78 y.o. female with medical history significant for Hypertension, arthritis, chronic back pain, ambulance at baseline with a walker being admitted for an acute CVA with last known well over 24 hours prior.  Most of the history taken from son at bedside who states that he last spoke with his mother the day prior.  Patient's niece who lives with her noted that she had not eaten all day and was not getting up and doing anything.  Son says she appeared to understand what was being said but could not respond.  He says she was able to ambulate to the car with her walker and with personal assistance and was brought to the emergency room.  In the ED, BP 129/99 and pulse 115, respirations 25 with normal O2 sat but she was placed on 2 L for comfort.  Afebrile.  Labs notable for mild polycythemia with hemoglobin of 15.4, hypernatremia with sodium 147 and creatinine 1.04 up from 0.83 about 8 months prior.  Urinalysis with ketones and not consistent with UTI.  EKG showed sinus tachycardia at 110 with RBBB, ST T wave inversions concerning for ischemia  CTA head and neck showed acute/recent left basal ganglia infarct, recommending MRI to assess for acute infarct.  Also shows multiple areas of varying levels of stenosis in the PCA and MCA territory  Clinical Impression  Pt is a 78 y.o. female admitted d/t acute CVA. She was received in supine with family members at bedside helping to communicate as she is non-verbal s/p CVA. Pt has primary impairments on the R side including decreased strength, ROM, sensation, and coordination. L sided deficits also noted including decreased gross strength of extremities but it is unclear if this is baseline for her L side. Pt required 2+ total A for rolling, supine to sit, and sit to supine transfers. Once EOB, pt was able  to balance with 1+ max A and demonstrated LUE righting reaction when provided with pertubation to the L. No righting reactions observed in the R UE/LE. Attempts to use pt's L hand to perform AAROM to RUE with no effect. Pt left in supine with family members present. She will continue to benefit from skilled PT services to address her primary impairments s/p CVA and improve her functional mobility status.       If plan is discharge home, recommend the following: A lot of help with walking and/or transfers;A lot of help with bathing/dressing/bathroom;Assist for transportation;Direct supervision/assist for financial management;Assistance with feeding;Assistance with cooking/housework   Can travel by private vehicle   No    Equipment Recommendations Other (comment) (TBD at next venue)  Recommendations for Other Services       Functional Status Assessment Patient has had a recent decline in their functional status and demonstrates the ability to make significant improvements in function in a reasonable and predictable amount of time.     Precautions / Restrictions Precautions Precautions: None Restrictions Weight Bearing Restrictions Per Provider Order: No (Simultaneous filing. User may not have seen previous data.)      Mobility  Bed Mobility Overal bed mobility: Needs Assistance Bed Mobility: Supine to Sit, Sit to Supine, Rolling Rolling: +2 for physical assistance, Total assist   Supine to sit: Total assist, +2 for physical assistance, HOB elevated Sit to supine: Total assist, +2 for physical assistance   General bed mobility  comments: pt required 2+ total A for supine to sit and sit to supine transfers but once sitting EOB, pt demonstrated ability to balance with Max 1+    Transfers                   General transfer comment: NT - EOB highest level achieved    Ambulation/Gait               General Gait Details: NT - EOB highest level achieved  Stairs             Wheelchair Mobility     Tilt Bed    Modified Rankin (Stroke Patients Only)       Balance Overall balance assessment: Needs assistance Sitting-balance support: Feet supported, No upper extremity supported Sitting balance-Leahy Scale: Zero Sitting balance - Comments: once sitting EOB, pt demonstrated ability to balance with Max 1+. L arm righting reaction observed when external force given on right side but no righting reactions observed in the right arm.       Standing balance comment: NT - EOB highest level achieved                             Pertinent Vitals/Pain Pain Assessment Pain Assessment: No/denies pain    Home Living Family/patient expects to be discharged to:: Private residence Living Arrangements: Other relatives (grand daughter + 2 teenage great grand kids  Simultaneous filing. User may not have seen previous data.) Available Help at Discharge: Family;Friend(s) (Simultaneous filing. User may not have seen previous data.) Type of Home: House (Simultaneous filing. User may not have seen previous data.) Home Access: Stairs to enter (Simultaneous filing. User may not have seen previous data.) Entrance Stairs-Rails:  (unknown) Entrance Stairs-Number of Steps: 2 (Simultaneous filing. User may not have seen previous data.)   Home Layout: One level (Simultaneous filing. User may not have seen previous data.) Home Equipment: Rollator (4 wheels) Additional Comments: household amb with rollator at baseline. assist with dressing at baseline. verbal at baseline. history of multiple falls.    Prior Function Prior Level of Function : Needs assist (Simultaneous filing. User may not have seen previous data.)       Physical Assist : ADLs (physical) (Simultaneous filing. User may not have seen previous data.) Mobility (physical):  (multiple falls) ADLs (physical): Dressing (Simultaneous filing. User may not have seen previous data.) Mobility Comments:  rollator for household amb at baseline (Simultaneous filing. User may not have seen previous data.) ADLs Comments: assist with dressing     Extremity/Trunk Assessment   Upper Extremity Assessment Upper Extremity Assessment: RUE deficits/detail RUE Deficits / Details: decreased strength and ROM on R d/t CVA; no trace muscle firing observed    Lower Extremity Assessment Lower Extremity Assessment: RLE deficits/detail RLE Deficits / Details: decreased strength and ROM d/t CVA; no trace muscle firing observed    Cervical / Trunk Assessment Cervical / Trunk Assessment: Kyphotic  Communication   Communication Communication: Impaired Factors Affecting Communication: Difficulty expressing self    Cognition Arousal: Lethargic Behavior During Therapy: Flat affect   PT - Cognitive impairments: Difficult to assess Difficult to assess due to: Impaired communication, Level of arousal                     PT - Cognition Comments: pt non-verbal at this time and very lethargic; needs constant vc to stay awake/keep eyes open. family at bedside to confirm  hx. Following commands: Intact       Cueing Cueing Techniques: Verbal cues, Gestural cues, Tactile cues, Visual cues     General Comments      Exercises Other Exercises Other Exercises: attempts to rejuvinate muscle function on RUE using AAROM with LUE guidance -- Pt has some function of LUE but gross ROM remains limited. Pt unable to use LUE to move RUE. Other Exercises: mobilize to EOB to improve arousal status Other Exercises: slow, constant pertubations at trunk while EOB to assess righting reactions-- pt maintains LUE righting with R trunk lean but no RUE righting reaction with L trunk lean   Assessment/Plan    PT Assessment Patient needs continued PT services  PT Problem List Decreased strength;Decreased range of motion;Decreased activity tolerance;Decreased balance;Decreased mobility;Decreased coordination;Decreased  cognition;Decreased knowledge of use of DME;Decreased safety awareness;Impaired sensation       PT Treatment Interventions Neuromuscular re-education;Cognitive remediation;Patient/family education;Balance training;Therapeutic activities;Therapeutic exercise;Functional mobility training    PT Goals (Current goals can be found in the Care Plan section)  Acute Rehab PT Goals Patient Stated Goal: none stated PT Goal Formulation: With family Time For Goal Achievement: 04/01/24 Potential to Achieve Goals: Fair    Frequency Min 3X/week     Co-evaluation               AM-PAC PT 6 Clicks Mobility  Outcome Measure Help needed turning from your back to your side while in a flat bed without using bedrails?: Total Help needed moving from lying on your back to sitting on the side of a flat bed without using bedrails?: Total Help needed moving to and from a bed to a chair (including a wheelchair)?: Total Help needed standing up from a chair using your arms (e.g., wheelchair or bedside chair)?: Total Help needed to walk in hospital room?: Total Help needed climbing 3-5 steps with a railing? : Total 6 Click Score: 6    End of Session Equipment Utilized During Treatment: Oxygen Activity Tolerance: Patient limited by lethargy Patient left: in bed;with call bell/phone within reach;with bed alarm set;with family/visitor present Nurse Communication: Mobility status PT Visit Diagnosis: Muscle weakness (generalized) (M62.81);Hemiplegia and hemiparesis;Adult, failure to thrive (R62.7);History of falling (Z91.81) Hemiplegia - Right/Left: Right Hemiplegia - dominant/non-dominant: Dominant Hemiplegia - caused by: Cerebral infarction    Time: 1022-1049 PT Time Calculation (min) (ACUTE ONLY): 27 min   Charges:                 Allena Bulls, SPT   Allena Bulls 03/18/2024, 12:58 PM

## 2024-03-18 NOTE — Evaluation (Addendum)
 Clinical/Bedside Swallow Evaluation Patient Details  Name: Marie Pham MRN: [id number] Date of Birth: 1945/06/02  Today's Date: 03/18/2024 Time: SLP Start Time (ACUTE ONLY): 0900 SLP Stop Time (ACUTE ONLY): 0930 SLP Time Calculation (min) (ACUTE ONLY): 30 min  Past Medical History:  Past Medical History:  Diagnosis Date   Asthma    Depression    Hypertension    Sleep apnea    Past Surgical History:  Past Surgical History:  Procedure Laterality Date   ABDOMINAL HYSTERECTOMY     COLON SURGERY     EYE SURGERY Left    HERNIA REPAIR     HPI:  Pt is a 78 yo female with PMHx of medical history significant for Hypertension, arthritis, chronic back pain, utilizes walker for ambulation. DG Chest:  Linear atelectasis in the left base and right mid lung, which may contribute to shortness of breath. No acute cardiopulmonary process identified. CT Head:  Limited CT head with hypodensity in the left basal ganglia that is  suspicious for acute/recent infarct. Recommend MRI to assess for acute infarct.  2. 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 3. Severe bilateral P2 PCA stenoses. 4. Moderate left vertebral artery origin stenosis. 5. Mild to moderate left M1 and M2 MCA stenoses. Per pulmonology note (03/06/24) pt with hx of irritable airways and occasional cough.    Assessment / Plan / Recommendation  Clinical Impression  Pt seen for bedside swallow evaluation in the setting of CVA (Head CT revealing acute/recent left basal ganglia infarct). No hx of dysphagia in chart- though pt with hx of irritable airways and occasional cough per Nov 2025 pulmonology note. Family reporting intermittent dry cough t/o day, with noted coughing onset with pt reclining for completion of EKG. Pt lethargic upon therapist entrance- son reporting pt with minimal rest overnight. Intermittent alertness (eyes open then fluttering back closed). Intermittent command following for completion  of assessment. No overt oral/facial asymmetry, though noted right anterior escape with thin liquid trials- needs further assessment. Lethargy directly impacting consistent attention and efficiency/safety with PO intake. Single dry cough noted following tsp of water - no other overt s/sx of aspiration. Given current lethargy/deconditioning and recent CVA, pt is at increased risk for aspiration. Recommend continued NPO status, with select amount of ice chips after oral care (ONLY when pt is alert, sitting upright and following oral care). Education shared with family (daughter and son) regarding risk for aspiration and recommendations. All reported understanding. RN and MD aware of recommendations. SLP will follow for PO readiness.   Of note- cognitive linguistic evaluation held to allow for rest prior to completion. Family currently utilizing hand squeeze for expression. No verbalizations noted. Further assessment warranted to identify accuracy with expression methods and current expressive/receptive language needs/abilities.  SLP Visit Diagnosis: Dysphagia, unspecified (R13.10) (in relation to CVA)    Aspiration Risk  Moderate aspiration risk    Diet Recommendation   NPO (with allowance of ice chips)  Medication Administration: Via alternative means    Other  Recommendations Oral Care Recommendations: Oral care prior to ice chip/H20     Assistance Recommended at Discharge    Functional Status Assessment Patient has had a recent decline in their functional status and demonstrates the ability to make significant improvements in function in a reasonable and predictable amount of time.  Frequency and Duration min 2x/week  2 weeks       Prognosis Prognosis for improved oropharyngeal function: Fair Barriers to Reach Goals: Language deficits;Cognitive deficits;Severity of deficits  Swallow Study   General Date of Onset: 03/18/24 HPI: Pt is a 78 yo female with PMHx of medical history  significant for Hypertension, arthritis, chronic back pain, utilizes walker for ambulation. DG Chest:  Linear atelectasis in the left base and right mid lung, which may contribute to shortness of breath. No acute cardiopulmonary process identified. CT Head:  Limited CT head with hypodensity in the left basal ganglia that is  suspicious for acute/recent infarct. Recommend MRI to assess for acute infarct.  2. 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 3. Severe bilateral P2 PCA stenoses. 4. Moderate left vertebral artery origin stenosis. 5. Mild to moderate left M1 and M2 MCA stenoses. Per pulmonology note (03/06/24) pt with hx of irritable airways and occasional cough. Type of Study: Bedside Swallow Evaluation Previous Swallow Assessment: none in chart Diet Prior to this Study: NPO Temperature Spikes Noted: No Respiratory Status: Nasal cannula (2L) History of Recent Intubation: No Behavior/Cognition: Confused;Lethargic/Drowsy;Requires cueing Oral Cavity Assessment: Within Functional Limits Oral Care Completed by SLP: Yes Oral Cavity - Dentition:  (false teeth in place for assessment) Vision:  (needed assist for self feeding) Self-Feeding Abilities: Total assist Patient Positioning: Upright in bed Baseline Vocal Quality: Not observed Volitional Cough: Cognitively unable to elicit Volitional Swallow: Unable to elicit    Oral/Motor/Sensory Function Overall Oral Motor/Sensory Function:  (limited assessment d/t lethargy and intermittent command following)   Ice Chips Ice chips: Impaired Presentation: Spoon Oral Phase Impairments: Impaired mastication;Poor awareness of bolus;Reduced labial seal Oral Phase Functional Implications: Right anterior spillage;Prolonged oral transit Pharyngeal Phase Impairments: Suspected delayed Swallow   Thin Liquid Thin Liquid: Impaired Presentation: Spoon Oral Phase Impairments: Reduced labial seal;Poor awareness of bolus Oral Phase  Functional Implications: Right anterior spillage;Prolonged oral transit Pharyngeal  Phase Impairments: Suspected delayed Swallow    Nectar Thick Nectar Thick Liquid: Not tested   Honey Thick Honey Thick Liquid: Not tested   Puree Puree: Not tested   Solid     Solid: Not tested     Ethlyn Alto Clapp, MS, CCC-SLP Speech Language Pathologist Rehab Services; [facility] [phone] (ascom)   Angeletta Goelz J Clapp 03/18/2024,10:04 AM

## 2024-03-18 NOTE — Evaluation (Signed)
 Occupational Therapy Evaluation Patient Details Name: Marie Pham MRN: [id number] DOB: Jun 22, 1945 Today's Date: 03/18/2024   History of Present Illness   Marie Pham is a 78 y.o. female with medical history significant for Hypertension, arthritis, chronic back pain, ambulance at baseline with a walker being admitted for an acute CVA with last known well over 24 hours prior.  Most of the history taken from son at bedside who states that he last spoke with his mother the day prior.  Patient's niece who lives with her noted that she had not eaten all day and was not getting up and doing anything.  Son says she appeared to understand what was being said but could not respond.  He says she was able to ambulate to the car with her walker and with personal assistance and was brought to the emergency room.  In the ED, BP 129/99 and pulse 115, respirations 25 with normal O2 sat but she was placed on 2 L for comfort.  Afebrile.  Labs notable for mild polycythemia with hemoglobin of 15.4, hypernatremia with sodium 147 and creatinine 1.04 up from 0.83 about 8 months prior.  Urinalysis with ketones and not consistent with UTI.  EKG showed sinus tachycardia at 110 with RBBB, ST T wave inversions concerning for ischemia  CTA head and neck showed acute/recent left basal ganglia infarct, recommending MRI to assess for acute infarct.  Also shows multiple areas of varying levels of stenosis in the PCA and MCA territory     Clinical Impressions Patient received for OT evaluation. See flowsheet below for details of function. Generally, patient unable to speak, unable to move R side of body, is demonstrating decreased following of commands, R hemiplegia. Very supportive family at bedside throughout assessment. Pt on 2L O2 throughout assessment. Did not t/f to EOB 2/2 no +2 assist available at time of OT session.  Patient will benefit from continued OT while in acute care.      If plan is discharge home,  recommend the following:   Two people to help with walking and/or transfers;Two people to help with bathing/dressing/bathroom;Direct supervision/assist for medications management;Assistance with cooking/housework;Assistance with feeding;Direct supervision/assist for financial management;Assist for transportation;Help with stairs or ramp for entrance;Supervision due to cognitive status     Functional Status Assessment   Patient has had a recent decline in their functional status and demonstrates the ability to make significant improvements in function in a reasonable and predictable amount of time.     Equipment Recommendations   Other (comment) (defer to next venue of care)     Recommendations for Other Services         Precautions/Restrictions   Precautions Precautions: Fall (NPO per SLP) Recall of Precautions/Restrictions: Impaired (Pt unable to speak at this time)     Mobility Bed Mobility               General bed mobility comments: OT did not have +2 assist today; unsafe to mobilize to EOB. Required Total+2 with PT earlier today.    Transfers                   General transfer comment: Not tested 2/2 safety.      Balance                                           ADL either performed or assessed  with clinical judgement   ADL Overall ADL's : Needs assistance/impaired                                       General ADL Comments: Patient is currently dependent for all ADLs; no purposeful movement of either UE during assessment. OT tried placing washcloth in L hand (the one that is able to move currently) and cue pt to reach it towards face; pt does not follow command.     Vision Patient Visual Report:  (No assessed, as pt not following commands.)       Perception         Praxis         Pertinent Vitals/Pain Pain Assessment Pain Assessment: Faces Pain Score: 0-No pain     Extremity/Trunk Assessment  Upper Extremity Assessment Upper Extremity Assessment: RUE deficits/detail RUE Deficits / Details: decreased strength and ROM on R d/t CVA; no trace muscle firing observed RUE Sensation:  (unable to test) LUE Deficits / Details: Is able to hold LUE up against gravity on second attempt. Does not squeeze OT's fingers in pt's L hand fist on command.   Lower Extremity Assessment Lower Extremity Assessment: RLE deficits/detail RLE Deficits / Details: decreased strength and ROM d/t CVA; no trace muscle firing observed   Cervical / Trunk Assessment Cervical / Trunk Assessment: Kyphotic   Communication Communication Communication: Impaired Factors Affecting Communication: Difficulty expressing self   Cognition Arousal: Lethargic Behavior During Therapy: Flat affect Cognition: Difficult to assess Difficult to assess due to: Level of arousal (And pt unable to speak)           OT - Cognition Comments: At baseline, family reports no cognitive concerns; able to talk well. Today, pt unable to talk. Does not follow commands for OT beyone keep this arm up when OT asking pt to hold static position of flexed L arm (at shoulder) in front of her body. Otherwise, does not move on command. Very lethargic during session; eye closing intermittently.                 Following commands: Impaired Following commands impaired: Follows one step commands inconsistently     Cueing  General Comments   Cueing Techniques: Verbal cues;Gestural cues;Tactile cues;Visual cues  Pt not speaking at all during session. Did cough once. Education to family provided on progression of therapy during acute hospitalization. 3 family members in room during OT assessment; family providing all prior level of function information; family very supportive and involved.   Exercises     Shoulder Instructions      Home Living Family/patient expects to be discharged to:: Private residence Living Arrangements: Other  relatives (grand daughter + 2 teenage great grand kids  Simultaneous filing. User may not have seen previous data.) Available Help at Discharge: Family;Friend(s) (Simultaneous filing. User may not have seen previous data.) Type of Home: House (Simultaneous filing. User may not have seen previous data.) Home Access: Stairs to enter (Simultaneous filing. User may not have seen previous data.) Entrance Stairs-Number of Steps: 2 (Simultaneous filing. User may not have seen previous data.) Entrance Stairs-Rails:  (unknown) Home Layout: One level (Simultaneous filing. User may not have seen previous data.)     Bathroom Shower/Tub: Tub/shower unit (Simultaneous filing. User may not have seen previous data.)   Bathroom Toilet: Standard (Simultaneous filing. User may not have seen previous data.) Bathroom Accessibility: Yes  Home Equipment: Rollator (4 wheels)   Additional Comments: household amb with rollator at baseline. assist with dressing at baseline. verbal at baseline. history of multiple falls.      Prior Functioning/Environment Prior Level of Function : Needs assist (Simultaneous filing. User may not have seen previous data.)       Physical Assist : ADLs (physical) (Simultaneous filing. User may not have seen previous data.) Mobility (physical):  (multiple falls) ADLs (physical): Dressing (Simultaneous filing. User may not have seen previous data.) Mobility Comments: rollator for household amb at baseline (Simultaneous filing. User may not have seen previous data.) ADLs Comments: assist with dressing    OT Problem List: Decreased activity tolerance;Decreased cognition;Decreased coordination;Impaired balance (sitting and/or standing);Decreased knowledge of precautions;Impaired UE functional use;Decreased safety awareness;Decreased strength   OT Treatment/Interventions: Self-care/ADL training;Therapeutic exercise;Neuromuscular education;DME and/or AE instruction;Therapeutic  activities;Cognitive remediation/compensation;Patient/family education      OT Goals(Current goals can be found in the care plan section)   Acute Rehab OT Goals Patient Stated Goal: unable to state; family goal is to return to baseline OT Goal Formulation: With family Time For Goal Achievement: 04/01/24 Potential to Achieve Goals: Fair ADL Goals Pt Will Perform Grooming: with mod assist;sitting Pt Will Perform Upper Body Dressing: with mod assist;sitting Pt Will Transfer to Toilet: with max assist;bedside commode   OT Frequency:  Min 3X/week    Co-evaluation              AM-PAC OT 6 Clicks Daily Activity     Outcome Measure Help from another person eating meals?: Total Help from another person taking care of personal grooming?: Total Help from another person toileting, which includes using toliet, bedpan, or urinal?: Total Help from another person bathing (including washing, rinsing, drying)?: Total Help from another person to put on and taking off regular upper body clothing?: Total Help from another person to put on and taking off regular lower body clothing?: Total 6 Click Score: 6   End of Session Equipment Utilized During Treatment:  investment banker, corporate) Nurse Communication: Mobility status  Activity Tolerance: Patient limited by lethargy Patient left: in bed;with call bell/phone within reach;with bed alarm set;with nursing/sitter in room;with family/visitor present (RN in room)  OT Visit Diagnosis: Other symptoms and signs involving the nervous system (R29.898);Other symptoms and signs involving cognitive function;Muscle weakness (generalized) (M62.81);Repeated falls (R29.6);Hemiplegia and hemiparesis Hemiplegia - Right/Left: Right Hemiplegia - dominant/non-dominant: Dominant                Time: 8852-8790 OT Time Calculation (min): 22 min Charges:  OT General Charges $OT Visit: 1 Visit OT Evaluation $OT Eval High Complexity: 1 High  Jasmine Arlean Shams, MS,  OTR/L  Jasmine Shams 03/18/2024, 1:24 PM

## 2024-03-18 NOTE — Progress Notes (Addendum)
 CTA chest and MRI brain reviewed.  No evidence of acute pulmonary embolism on CT chest but bilateral pneumonitis suspected.  Patient is already on IV ceftriaxone . MRI brain confirmed acute stroke.  Plan of care was discussed with Marie Pham, son, over the phone.  He was tearful and concerned that his mother had not had anything to eat.  I explained that it was unsafe to feed her at this time because of concern for aspiration which could jeopardize her health.  We discussed goals of care.  He said that his mother had previously stated she did not want to be on any breathing machine or life support and did not want CPR.  With his permission, CODE STATUS has been changed from full code to DNR. Consult palliative care team for further discussions regarding goals of care.

## 2024-03-18 NOTE — Consult Note (Signed)
 NEUROLOGY CONSULT NOTE   Date of service: March 18, 2024 Patient Name: Marie Pham MRN:  [id number] DOB:  April 24, 1946 Reason for Consultation: Stroke on imaging Requesting Provider: Jens Durand, MD  History of Present Illness  Marie Pham is a 78 y.o. female with a PMHx of asthma, depression, HTN and sleep apnea who presented to the hospital on Friday afternoon with a chief complaint of worsening of her chronic back pain. When the patient was seen by EDP, the family gave additional history, stating that the patient had become aphasic over the previous 2-3 days and had stopped eating. Neurological exam revealed mild RUE drift, severe RLE drift and aphasia, which were findings concerning for a left MCA stroke. CTA of head and neck was then obtained, revealing a subacute left basal ganglia infarction.     ROS  Unable to obtain due to expressive aphasia.  Past History   Past Medical History:  Diagnosis Date   Asthma    Depression    Hypertension    Sleep apnea     Past Surgical History:  Procedure Laterality Date   ABDOMINAL HYSTERECTOMY     COLON SURGERY     EYE SURGERY Left    HERNIA REPAIR      Family History: Family History  Problem Relation Age of Onset   Hypertension Sister    Alcohol abuse Brother    Hypertension Brother    Diabetes Brother     Social History  reports that she quit smoking about 29 years ago. She has never used smokeless tobacco. She reports that she does not drink alcohol and does not use drugs.  Allergies  Allergen Reactions   Lansoprazole Nausea And Vomiting   Penicillins Hives   Prozac [Fluoxetine] Hives   Bupropion Rash    Medications   Current Facility-Administered Medications:    [START ON 03/19/2024]  stroke: early stages of recovery book, , Does not apply, Once, Marie Delayne GAILS, MD   0.9 %  sodium chloride  infusion, , Intravenous, Continuous, Marie, Delayne GAILS, MD   0.9 %  sodium chloride  infusion, , Intravenous,  Continuous, Marie Delayne GAILS, MD, Last Rate: 75 mL/hr at 03/18/24 0241, New Bag at 03/18/24 0241   acetaminophen  (TYLENOL ) tablet 650 mg, 650 mg, Oral, Q4H PRN **OR** acetaminophen  (TYLENOL ) 160 MG/5ML solution 650 mg, 650 mg, Per Tube, Q4H PRN **OR** acetaminophen  (TYLENOL ) suppository 650 mg, 650 mg, Rectal, Q4H PRN, Marie Delayne GAILS, MD   cefTRIAXone  (ROCEPHIN ) 1 g in sodium chloride  0.9 % 100 mL IVPB, 1 g, Intravenous, Q24H, Jens Durand, MD   enoxaparin  (LOVENOX ) injection 42.5 mg, 0.5 mg/kg, Subcutaneous, Q24H, Marie Delayne GAILS, MD  Vitals   Vitals:   03/18/24 0021 03/18/24 0359 03/18/24 0544 03/18/24 0750  BP: (!) 167/84 (!) 147/74 (!) 154/63 (!) 140/57  Pulse: (!) 111 (!) 119 (!) 121 (!) 120  Resp:  19 18 19   Temp:  98.7 F (37.1 C) 99.1 F (37.3 C) 98.8 F (37.1 C)  TempSrc:  Oral Oral Oral  SpO2: 95% 97% 98% 97%  Weight:      Height:        Body mass index is 30.63 kg/m.   Physical Exam   Constitutional: Sleeping. Appears well-developed and well-nourished.  Eyes: No scleral injection.  Head: Normocephalic.  Skin: No rash seen to face. Non-diaphoretic.   Neurologic Examination   Mental Status: Sleeping. Will arouse to a partially eyes-open state, but is not following commands. Deferred noxious stimuli.  Cranial  Nerves: Patient not cooperative and family in room willing to allow her to rest rather than using noxious stimuli to obtain a full exam.  Motor/Sensory: Tone normal x 4. Not following commands. Deferred sternal rub.  Cerebellar/Gait: Deferred  Labs/Imaging/Neurodiagnostic studies   CBC:  Recent Labs  Lab 03/25/24 1624  WBC 9.4  NEUTROABS 5.8  HGB 15.4*  HCT 47.4*  MCV 88.3  PLT 363   Basic Metabolic Panel:  Lab Results  Component Value Date   NA 147 (H) 03/25/24   K 4.0 2024/03/25   CO2 25 03-25-2024   GLUCOSE 135 (H) 03-25-2024   BUN 25 (H) Mar 25, 2024   CREATININE 1.04 (H) 2024/03/25   CALCIUM  9.8 Mar 25, 2024   GFRNONAA 56 (L)  03/25/24   GFRAA >60 02/14/2017   Lipid Panel:  Lab Results  Component Value Date   LDLCALC 168 (H) 03/18/2024   HgbA1c:  Lab Results  Component Value Date   HGBA1C 6.1 (H) 03/18/2024   TTE: 1. Left ventricular ejection fraction, by estimation, is 60 to 65%. The  left ventricle has normal function. The left ventricle has no regional  wall motion abnormalities. Left ventricular diastolic parameters are  consistent with Grade III diastolic dysfunction (restrictive).   2. Right ventricular systolic function is normal. The right ventricular  size is normal.   3. Left atrial size was mildly dilated.   4. Right atrial size was mildly dilated.   5. The mitral valve is normal in structure. Trivial mitral valve  regurgitation. No evidence of mitral stenosis.   6. The aortic valve is calcified. Aortic valve regurgitation is trivial.  Severe aortic valve stenosis.   7. The inferior vena cava is normal in size with greater than 50%  respiratory variability, suggesting right atrial pressure of 3 mmHg.   ASSESSMENT  Marie Pham is a 78 y.o. female with a PMHx of asthma, depression, HTN and sleep apnea who presented to the hospital on Friday afternoon with a chief complaint of worsening of her chronic back pain. When the patient was seen by EDP, the family gave additional history, stating that the patient had become aphasic over the previous 2-3 days and had stopped eating. Neurological exam revealed mild RUE drift, severe RLE drift and aphasia, which were findings concerning for a left MCA stroke. CTA of head and neck was then obtained, revealing a subacute left basal ganglia infarction.  - CT head: Limited CT head with hypodensity in the left basal ganglia that is suspicious for acute/recent infarct. - CTA of head and neck: Occluded versus severely stenotic distal right nondominant vertebral artery. Severe bilateral P2 PCA stenoses. Moderate left vertebral artery origin stenosis. Mild to  moderate left M1 and M2 MCA stenoses. - MRI brain: Confluent Left ACA territory acute infarct, including involvement of the inferomedial left caudate nucleus. Additionally, small area of acute infarct in the proximal right ACA territory, and several sub centimeter foci of acute ischemia in the distal right MCA/ACA watershed. Cytotoxic edema with no hemorrhagic transformation or significant mass effect. Elsewhere stable chronic small vessel disease. - TTE with EF of 60-65%. No mural thrombus or valvular vegetation mentioned in the report. - EKG: Sinus tachycardia; Right atrial enlargement; RBBB and LPFB; Inferior infarct, acute; Lateral leads are also involved; >>> Acute MI <<< - Labs:  - Elevated cholesterol and LDL, low HDL.  - D-dimer elevated at 0.92 - HgbA1c elevated at 6.1 - Impression:  - Acute left ACA territory ischemic infarction - Given the location of  her infarction, she is likely to need significant rehab to regain the ability to ambulate, if that will be possible at all. May be able to regain some RUE function. Family has changed code status to DNR and Hospitalist team plans to consult Palliative Care.  - No evidence of acute pulmonary embolism on CT chest but bilateral pneumonitis suspected.   RECOMMENDATIONS  - Cardiac telemetry - NPO for now due to concern for possible aspiration - Her Code status has been changed from full code to DNR - Hospitalist team plans to consult Palliative Care - ASA suppository 300 mg every day.  - BP management per standard protocol. Out of the permissive HTN time window.  - IV hydration  - Neurohospitalist service will sign off. Please call if there are additional questions.  ______________________________________________________________________    Marie Pham, Marie Enrico, MD [facility]

## 2024-03-18 NOTE — Assessment & Plan Note (Addendum)
 Patient with tachycardia and tachypnea suspect secondary to dehydration No infectious source, no hypoxia Will get chest x-ray to evaluate for possible aspiration-->non acute Will get D-dimer-->slightly above age adjusted threshold, but without hypoxemia Will assess response to IV fluid

## 2024-03-18 NOTE — Assessment & Plan Note (Addendum)
 Patient with right-sided weakness, aphasia with unknown last well Permissive hypertension for first 24-48 hrs post stroke onset: Prn Labetalol IV or Vasotec IV If BP greater than 220/120  Statins for LDL goal less than 70 Rectal aspirin  being given.  Plans for ASA 81mg  daily, Plavix  75mg  daily x 3 weeks then monotherapy thereafter Telemetry, echocardiogram Holding off on MRI  for now due to risk of aspiration Avoid dextrose  containing fluids, Maintain euglycemia, euthermia Neuro checks q4 hrs x 24 hrs and then per shift Head of bed 30 degrees Physical therapy/Occupational therapy/Speech therapy if failed dysphagia screen Consider neurology consult in the a.m.

## 2024-03-18 NOTE — Assessment & Plan Note (Addendum)
 T wave inversions inferior leads Getting troponins-->16

## 2024-03-19 DIAGNOSIS — I350 Nonrheumatic aortic (valve) stenosis: Secondary | ICD-10-CM

## 2024-03-19 DIAGNOSIS — I639 Cerebral infarction, unspecified: Secondary | ICD-10-CM | POA: Diagnosis not present

## 2024-03-19 DIAGNOSIS — J984 Other disorders of lung: Secondary | ICD-10-CM

## 2024-03-19 DIAGNOSIS — Z7189 Other specified counseling: Secondary | ICD-10-CM

## 2024-03-19 DIAGNOSIS — Z711 Person with feared health complaint in whom no diagnosis is made: Secondary | ICD-10-CM | POA: Diagnosis not present

## 2024-03-19 DIAGNOSIS — R000 Tachycardia, unspecified: Secondary | ICD-10-CM

## 2024-03-19 DIAGNOSIS — Z515 Encounter for palliative care: Secondary | ICD-10-CM | POA: Diagnosis not present

## 2024-03-19 DIAGNOSIS — N179 Acute kidney failure, unspecified: Secondary | ICD-10-CM | POA: Diagnosis not present

## 2024-03-19 LAB — COMPREHENSIVE METABOLIC PANEL WITH GFR
ALT: 29 U/L (ref 0–44)
AST: 49 U/L — ABNORMAL HIGH (ref 15–41)
Albumin: 4 g/dL (ref 3.5–5.0)
Alkaline Phosphatase: 108 U/L (ref 38–126)
Anion gap: 13 (ref 5–15)
BUN: 18 mg/dL (ref 8–23)
CO2: 25 mmol/L (ref 22–32)
Calcium: 9.2 mg/dL (ref 8.9–10.3)
Chloride: 113 mmol/L — ABNORMAL HIGH (ref 98–111)
Creatinine, Ser: 0.92 mg/dL (ref 0.44–1.00)
GFR, Estimated: >60 mL/min (ref >60)
Glucose, Bld: 100 mg/dL — ABNORMAL HIGH (ref 70–99)
Potassium: 4 mmol/L (ref 3.5–5.1)
Sodium: 150 mmol/L — ABNORMAL HIGH (ref 135–145)
Total Bilirubin: 0.7 mg/dL (ref 0.0–1.2)
Total Protein: 7.3 g/dL (ref 6.5–8.1)

## 2024-03-19 LAB — CBC
HCT: 42.6 % (ref 36.0–46.0)
Hemoglobin: 13.6 g/dL (ref 12.0–15.0)
MCH: 29 pg (ref 26.0–34.0)
MCHC: 31.9 g/dL (ref 30.0–36.0)
MCV: 90.8 fL (ref 80.0–100.0)
Platelets: 309 K/uL (ref 150–400)
RBC: 4.69 MIL/uL (ref 3.87–5.11)
RDW: 14.2 % (ref 11.5–15.5)
WBC: 26.5 K/uL — ABNORMAL HIGH (ref 4.0–10.5)
nRBC: 0 % (ref 0.0–0.2)

## 2024-03-19 LAB — MAGNESIUM: Magnesium: 2.5 mg/dL — ABNORMAL HIGH (ref 1.7–2.4)

## 2024-03-19 LAB — PHOSPHORUS: Phosphorus: 4 mg/dL (ref 2.5–4.6)

## 2024-03-19 MED ORDER — SODIUM CHLORIDE 0.45 % IV SOLN: INTRAVENOUS | Status: DC

## 2024-03-19 MED ORDER — METOPROLOL TARTRATE 5 MG/5ML IV SOLN
5.0000 mg | Freq: Once | INTRAVENOUS | Status: AC
  Administered 2024-03-19: 5 mg via INTRAVENOUS
  Filled 2024-03-19: qty 5

## 2024-03-19 MED ORDER — SODIUM CHLORIDE 0.9 % IV SOLN
500.0000 mg | INTRAVENOUS | Status: DC
  Administered 2024-03-19 – 2024-03-22 (×4): 500 mg via INTRAVENOUS
  Filled 2024-03-19 (×4): qty 5

## 2024-03-19 NOTE — Progress Notes (Signed)
 SLP Cancellation Note  Patient Details Name: DREW HERMAN MRN: [id number] DOB: [birth year]   Cancelled treatment:       Reason Eval/Treat Not Completed: Fatigue/lethargy limiting ability to participate;Medical issues which prohibited therapy;Patient not medically ready;Patient's level of consciousness (chart reviewed; consulted NSG and MD re: this pt's State and status. Palliative Care discussion re: this pt's POC/GOC. Met w/ Family briefly.)   Per chart review, discussion w/ MD/Team, and observing pt in bed w/ Family around her, pt is Not ready for a BSE/oral intake at this time d/t HIGH risk for aspiration and Pulmonary decline.  Recommend monitoring pt's medical/mental status and State readiness for po trials/assessment- monitor for when pt is FULLY awake, can maintain attention/alertness to tasks, and can be aware to engage in eating/drinking/swallowing.   This was d/w MD and Team and Family in room. ST services will f/u tomorrow for ongoing assessment of pt's readiness and State. Recommend frequent oral care for hygiene and stimulation of swallowing; aspiration precautions. MD/NSG agreed.         Comer Portugal, MS, CCC-SLP Speech Language Pathologist Rehab Services; [facility] [phone] (ascom) Lezly Rumpf 03/19/2024, 6:05 PM

## 2024-03-19 NOTE — Plan of Care (Signed)
  Problem: Clinical Measurements: Goal: Will remain free from infection Outcome: Progressing Goal: Respiratory complications will improve Outcome: Progressing   Problem: Skin Integrity: Goal: Risk for impaired skin integrity will decrease Outcome: Progressing   

## 2024-03-19 NOTE — Progress Notes (Addendum)
 Progress Note    Marie Pham  FMW:[id number] DOB: 1946-01-15  DOA: 03/17/2024 PCP: Tobie Domino, MD      Brief Narrative:    Medical records reviewed and are as summarized below:  Marie Pham is a 78 y.o. female with medical history significant for hypertension, chronic back pain, arthritis, sleep apnea, depression, asthma/bronchitis, ambulates with a walker at baseline who presented to the hospital because of poor oral intake and speech difficulties.  Family had tried to communicate with her and she appeared to understand what her son was saying but she could not respond.  Onset or duration of speech difficulty is not clear.  In the ED, apparently patient indicated that her back hurts.  Vitals in the ED: Temperature 98.7 F, respiratory rate 18, pulse 115, BP 120/99 oxygen saturation 95% on room air.  Troponins negative, mildly elevated D-dimer 0.92. EKG shows sinus tachycardia, right bundle branch block, prolonged QTc at 494  CT angiogram of head and neck showed acute left basal ganglia infarct.  Chest x-ray 1. Linear atelectasis in the left base and right mid lung, which may contribute to shortness of breath. 2. No acute cardiopulmonary process identified.   She was admitted to the hospital for acute stroke.   Assessment/Plan:   Principal Problem:   Acute CVA (cerebrovascular accident) (HCC) Active Problems:   AKI (acute kidney injury)   Hypernatremia   SIRS (systemic inflammatory response syndrome) (HCC)   Abnormal EKG   Essential hypertension   Severe aortic stenosis   Pneumonitis    Body mass index is 30.63 kg/m.  (Class I obesity)    Acute stroke with right facial droop, right hemiparesis and aphasia: She remains lethargic and does not follow commands. 2D echo showed EF estimated at 60 to 65%, grade 3 diastolic dysfunction, severe aortic valve stenosis. Continue aspirin  per rectum.  Will start Plavix  and statin when patient passes swallow  evaluation or when NG tube is in place PT and OT recommended discharge to SNF. Speech therapist recommended NPO for now because of very high risk for aspiration. Discussed options with [name], daughter.  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 Attempt to place NG tube by nursing staff was unsuccessful. Order has been placed for interventional radiologist to assist with nasogastric tube placement.   SIRS, tachycardia, tachypnea, worsening leukocytosis: Telemetry showed sinus tachycardia with APCs.  Repeat EKG showed sinus tachycardia with APCs, right bundle branch block.  No evidence of atrial fibrillation or atrial flutter. CTA chest did not show any evidence of pulmonary embolism but there is evidence of bilateral pneumonitis.  This is probably due to aspiration.  Continue empiric IV ceftriaxone .  Add azithromycin .   Severe aortic stenosis, grade 3 diastolic dysfunction: Will need outpatient follow-up with cardiologist.   Hypernatremia, dehydration: Sodium up from 149-150.  Continue IV fluids for hydration.     AKI: Improved   Hypertension: Hold off on antihypertensives   Chronic back pain  Diet Order             Diet NPO time specified  Diet effective now                                  Consultants: Neurologist  Procedures: None    Medications:    aspirin   300 mg Rectal Daily   enoxaparin  (LOVENOX ) injection  40  mg Subcutaneous Q24H   Continuous Infusions:  cefTRIAXone  (ROCEPHIN )  IV 1 g (03/19/24 1148)     Anti-infectives (From admission, onward)    Start     Dose/Rate Route Frequency Ordered Stop   03/18/24 1045  cefTRIAXone  (ROCEPHIN ) 1 g in sodium chloride  0.9 % 100 mL IVPB        1 g 200 mL/hr over 30 Minutes Intravenous Every 24 hours 03/18/24 0945                Family Communication/Anticipated D/C date and plan/Code Status   DVT prophylaxis: enoxaparin  (LOVENOX )  injection 40 mg Start: 03/18/24 1038     Code Status: Full Code  Family Communication: Plan discussed with [name], daughter, at the bedside  Disposition Plan: Will likely need SNF at discharge   Status is: Inpatient Remains inpatient appropriate because: Acute stroke         Subjective:   Interval events noted.  Patient is still aphasic and unable to provide any history.  [name], daughter, was at the bedside.  Objective:    Vitals:   03/19/24 0400 03/19/24 0443 03/19/24 0753 03/19/24 1133  BP: (!) 140/72 (!) 154/75 (!) 150/87 130/87  Pulse: (!) 120 (!) 121 (!) 118 (!) 155  Resp: 20 20 16 20   Temp: 98.8 F (37.1 C) 98.6 F (37 C) 98.7 F (37.1 C) 98.5 F (36.9 C)  TempSrc: Oral Oral Oral Oral  SpO2:  100% 98% 99%  Weight:      Height:       No data found.   Intake/Output Summary (Last 24 hours) at 03/19/2024 1531 Last data filed at 03/19/2024 0443 Gross per 24 hour  Intake --  Output 350 ml  Net -350 ml   Filed Weights   03/17/24 1509  Weight: 83.5 kg    Exam:  GEN: NAD SKIN: Warm and dry EYES: No pallor or icterus ENT: MMM CV: RRR, tachycardic PULM: No wheezing or rales. ABD: soft, obese, NT, +BS CNS: Lethargic, aphasic, right facial droop, right-sided hemiplegia.  She does not follow commands. EXT: No edema or tenderness      Data Reviewed:   I have personally reviewed following labs and imaging studies:  Labs: Labs show the following:   Basic Metabolic Panel: Recent Labs  Lab 03/17/24 1624 03/18/24 1139 03/19/24 0404  NA 147* 149* 150*  K 4.0 4.4 4.0  CL 107 115* 113*  CO2 25 20* 25  GLUCOSE 135* 108* 100*  BUN 25* 20 18  CREATININE 1.04* 0.89 0.92  CALCIUM  9.8 8.9 9.2  MG  --   --  2.5*  PHOS  --   --  4.0   GFR Estimated Creatinine Clearance: 56.4 mL/min (by C-G formula based on SCr of 0.92 mg/dL). Liver Function Tests: Recent Labs  Lab 03/17/24 1624 03/19/24 0404  AST 22 49*  ALT 12 29  ALKPHOS 117 108   BILITOT 0.7 0.7  PROT 8.3* 7.3  ALBUMIN 4.5 4.0   Recent Labs  Lab 03/17/24 1624  LIPASE 42   No results for input(s): AMMONIA in the last 168 hours. Coagulation profile No results for input(s): INR, PROTIME in the last 168 hours.  CBC: Recent Labs  Lab 03/17/24 1624 03/18/24 1139 03/19/24 0404  WBC 9.4 15.7* 26.5*  NEUTROABS 5.8  --   --   HGB 15.4* 14.2 13.6  HCT 47.4* 44.9 42.6  MCV 88.3 91.1 90.8  PLT 363 300 309   Cardiac Enzymes: No results for  input(s): CKTOTAL, CKMB, CKMBINDEX, TROPONINI in the last 168 hours. BNP (last 3 results) No results for input(s): PROBNP in the last 8760 hours. CBG: No results for input(s): GLUCAP in the last 168 hours. D-Dimer: Recent Labs    03/18/24 0130  DDIMER 0.92*   Hgb A1c: Recent Labs    03/18/24 0107  HGBA1C 6.1*   Lipid Profile: Recent Labs    03/18/24 0326  CHOL 226*  HDL 38*  LDLCALC 168*  TRIG 103  CHOLHDL 6.0   Thyroid  function studies: No results for input(s): TSH, T4TOTAL, T3FREE, THYROIDAB in the last 72 hours.  Invalid input(s): FREET3 Anemia work up: No results for input(s): VITAMINB12, FOLATE, FERRITIN, TIBC, IRON, RETICCTPCT in the last 72 hours. Sepsis Labs: Recent Labs  Lab 03/17/24 1624 03/18/24 1139 03/19/24 0404  WBC 9.4 15.7* 26.5*    Microbiology No results found for this or any previous visit (from the past 240 hours).  Procedures and diagnostic studies:  CT Angio Chest Pulmonary Embolism (PE) W or WO Contrast Result Date: 03/18/2024 CLINICAL DATA:  Pulmonary embolism suspected, high probability. Shortness of breath. EXAM: CT ANGIOGRAPHY CHEST WITH CONTRAST TECHNIQUE: Multidetector CT imaging of the chest was performed using the standard protocol during bolus administration of intravenous contrast. Multiplanar CT image reconstructions and MIPs were obtained to evaluate the vascular anatomy. RADIATION DOSE REDUCTION: This exam was  performed according to the departmental dose-optimization program which includes automated exposure control, adjustment of the mA and/or kV according to patient size and/or use of iterative reconstruction technique. CONTRAST:  75mL OMNIPAQUE  IOHEXOL  350 MG/ML SOLN COMPARISON:  11/09/2022, 01/12/2016. FINDINGS: Cardiovascular: Heart is mildly enlarged and there is a trace pericardial effusion. Scattered coronary artery calcifications are present. There is atherosclerotic calcification of the aorta without evidence of aneurysm. Pulmonary trunk is normal in caliber. No definite evidence of pulmonary embolism is seen. Examination is limited due to respiratory motion artifact. Mediastinum/Nodes: No mediastinal, hilar, or axillary lymphadenopathy is seen. The thyroid  gland, trachea, and esophagus are within normal limits. Lungs/Pleura: Hazy ground-glass attenuation is present in the lungs bilaterally. Atelectasis is noted bilaterally. No effusion or pneumothorax is seen. Multiple nodules are noted bilaterally, the largest measuring 4 mm in the right middle lobe, axial image 61, and 4 mm in the left upper lobe, axial image 40, unchanged from 2017 and likely benign. Upper Abdomen: Hyperdense material is present within the gallbladder likely representing excreted contrast from previous examination. Cysts are noted in the right kidney. No acute abnormality. Musculoskeletal: Degenerative changes are present in the thoracic spine. No acute osseous abnormality. Review of the MIP images confirms the above findings. IMPRESSION: 1. No evidence of pulmonary embolism. 2. Ground-glass attenuation in the lungs bilaterally, possible air trapping or pneumonitis. 3. Cardiomegaly with coronary artery calcifications. 4. Aortic atherosclerosis. Electronically Signed   By: Leita Birmingham M.D.   On: 03/18/2024 15:42   ECHOCARDIOGRAM COMPLETE Result Date: 03/18/2024    ECHOCARDIOGRAM REPORT   Patient Name:   JOE TANNEY Date of Exam:  03/18/2024 Medical Rec #:  [id number]     Height:       65.0 in Accession #:    [id number]    Weight:       184.1 lb Date of Birth:  1946/01/24    BSA:          1.910 m Patient Age:    77 years      BP:           140/57 mmHg Patient  Gender: F             HR:           119 bpm. Exam Location:  [facility] Procedure: 2D Echo, Cardiac Doppler and Color Doppler (Both Spectral and Color            Flow Doppler were utilized during procedure). Indications:     Stroke I63.9  History:         Patient has no prior history of Echocardiogram examinations.                  Risk Factors:Hypertension.  Sonographer:     Bari Roar Referring Phys:  [id number] DELAYNE LULLA SOLIAN Diagnosing Phys: Denyse Bathe  Sonographer Comments: Technically difficult study due to poor echo windows and patient is obese. Image acquisition challenging due to patient body habitus and Image acquisition challenging due to respiratory motion. IMPRESSIONS  1. Left ventricular ejection fraction, by estimation, is 60 to 65%. The left ventricle has normal function. The left ventricle has no regional wall motion abnormalities. Left ventricular diastolic parameters are consistent with Grade III diastolic dysfunction (restrictive).  2. Right ventricular systolic function is normal. The right ventricular size is normal.  3. Left atrial size was mildly dilated.  4. Right atrial size was mildly dilated.  5. The mitral valve is normal in structure. Trivial mitral valve regurgitation. No evidence of mitral stenosis.  6. The aortic valve is calcified. Aortic valve regurgitation is trivial. Severe aortic valve stenosis.  7. The inferior vena cava is normal in size with greater than 50% respiratory variability, suggesting right atrial pressure of 3 mmHg. FINDINGS  Left Ventricle: Left ventricular ejection fraction, by estimation, is 60 to 65%. The left ventricle has normal function. The left ventricle has no regional wall motion abnormalities. Strain was performed and the global  longitudinal strain is indeterminate. The left ventricular internal cavity size was normal in size. There is no left ventricular hypertrophy. Left ventricular diastolic parameters are consistent with Grade III diastolic dysfunction (restrictive). Right Ventricle: The right ventricular size is normal. No increase in right ventricular wall thickness. Right ventricular systolic function is normal. Left Atrium: Left atrial size was mildly dilated. Right Atrium: Right atrial size was mildly dilated. Pericardium: There is no evidence of pericardial effusion. Mitral Valve: The mitral valve is normal in structure. Trivial mitral valve regurgitation. No evidence of mitral valve stenosis. Tricuspid Valve: The tricuspid valve is normal in structure. Tricuspid valve regurgitation is trivial. No evidence of tricuspid stenosis. Aortic Valve: The aortic valve is calcified. Aortic valve regurgitation is trivial. Severe aortic stenosis is present. Aortic valve mean gradient measures 32.5 mmHg. Aortic valve peak gradient measures 57.3 mmHg. Aortic valve area, by VTI measures 0.53 cm. Pulmonic Valve: The pulmonic valve was normal in structure. Pulmonic valve regurgitation is not visualized. No evidence of pulmonic stenosis. Aorta: The aortic root is normal in size and structure. Venous: The inferior vena cava is normal in size with greater than 50% respiratory variability, suggesting right atrial pressure of 3 mmHg. IAS/Shunts: No atrial level shunt detected by color flow Doppler. Additional Comments: 3D was performed not requiring image post processing on an independent workstation and was indeterminate.  LEFT VENTRICLE PLAX 2D LVIDd:         3.60 cm   Diastology LVIDs:         2.40 cm   LV e' medial:    7.15 cm/s LV PW:  1.20 cm   LV E/e' medial:  24.2 LV IVS:        1.70 cm   LV e' lateral:   7.93 cm/s LVOT diam:     1.70 cm   LV E/e' lateral: 21.8 LV SV:         32 LV SV Index:   17 LVOT Area:     2.27 cm  RIGHT VENTRICLE  RV S prime:     10.70 cm/s LEFT ATRIUM           Index LA diam:      3.20 cm 1.68 cm/m LA Vol (A4C): 33.6 ml 17.60 ml/m  AORTIC VALVE                     PULMONIC VALVE AV Area (Vmax):    0.76 cm      PV Vmax:        1.56 m/s AV Area (Vmean):   0.62 cm      PV Peak grad:   9.7 mmHg AV Area (VTI):     0.53 cm      RVOT Peak grad: 6 mmHg AV Vmax:           378.50 cm/s AV Vmean:          258.000 cm/s AV VTI:            0.591 m AV Peak Grad:      57.3 mmHg AV Mean Grad:      32.5 mmHg LVOT Vmax:         127.00 cm/s LVOT Vmean:        70.400 cm/s LVOT VTI:          0.139 m LVOT/AV VTI ratio: 0.24  AORTA Ao Root diam: 2.30 cm Ao Asc diam:  2.80 cm MITRAL VALVE                TRICUSPID VALVE MV Area (PHT): 7.59 cm     TR Peak grad:   18.8 mmHg MV Decel Time: 100 msec     TR Vmax:        217.00 cm/s MV E velocity: 173.00 cm/s MV A Prime:    13.7 cm/s    SHUNTS                             Systemic VTI:  0.14 m                             Systemic Diam: 1.70 cm Denyse Bathe Electronically signed by Denyse Bathe Signature Date/Time: 03/18/2024/2:42:42 PM    Final    MR BRAIN WO CONTRAST Result Date: 03/18/2024 EXAM: MRI BRAIN WITHOUT CONTRAST 03/18/2024 12:53:00 PM TECHNIQUE: Multiplanar multisequence MRI of the head/brain was performed without the administration of intravenous contrast. COMPARISON: CT head and CTA head and neck 03/17/2024. Brain MRI 07/17/2023. CLINICAL HISTORY: 78 year old female with acute neuro deficit, stroke suspected. FINDINGS: BRAIN: Confluent restricted diffusion throughout much of the left ACA territory (series 5 image 36, series 7 image 28) with T2 and FLAIR hyperintense cytotoxic edema. Similar intense restricted diffusion also in the anterior and inferior caudate nucleus, corresponding to head CT finding yesterday. Caudate is mildly expanded by cytotoxic edema. No hemorrhagic transformation. There is also subtle contralateral proximal right ACA territory infarct with restricted diffusion  and cytotoxic edema affecting the right inferior frontal gyrus (series  5 image 24, series 9 image 24). And Faint right superior frontal gyrus white matter subcentimeter restricted diffusion (series 5 image 39) is noted in the right MCA / ACA watershed territory. Chronic periventricular and scattered cerebral white matter T2 and FLAIR hyperintensity is moderate for age and stable outside of the acute findings. Minimal chronic microhemorrhage suspected in the brain such as left occipital lobe series 12 image 27. Chronic lacunar infarct in the ventral right thalamus. No significant intracranial mass effect. No midline shift. No hydrocephalus. The sella is unremarkable. Normal flow voids. Mediovascular phleboliths at the skull base are preserved. No other diffusion restriction. Brainstem and cerebellum are stable. ORBITS: No acute abnormality. SINUSES AND MASTOIDS: Stable mild left mastoid effusion. Negative visible nasopharynx. BONES AND SOFT TISSUES: Hyperostosis of the calvarium, normal variant. Normal marrow signal. No acute soft tissue abnormality. Negative visible cervical spine. IMPRESSION: 1. Confluent Left ACA territory acute infarct, including involvement of the inferomedial left caudate nucleus. Additionally, small area of acute infarct in the proximal right ACA territory, and several sub centimeter foci of acute ischemia in the distal right MCA/ACA watershed. 2. Cytotoxic edema with no hemorrhagic transformation or significant mass effect. 3. Elsewhere stable chronic small vessel disease. Electronically signed by: Helayne Hurst MD 03/18/2024 01:04 PM EST RP Workstation: [id number]   DG Chest Port 1 View Result Date: 03/18/2024 EXAM: 1 VIEW(S) XRAY OF THE CHEST 03/18/2024 10:29:00 AM COMPARISON: Radiographs from 07/17/2023. CTA head and neck from 03/17/2024. Radiographs from 03/18/2024 (earlier today). CLINICAL HISTORY: 78 year old female with pneumonia. FINDINGS: LUNGS AND PLEURA: Low lung volumes. Stable  linear scarring or subsegmental atelectasis at left base. The right suprahilar opacity appears chronic but increased when compared to radiographs from 07/17/2023. Appearance unchanged from earlier today. CTA neck yesterday negative aside from gas trapping. No pleural effusion. No pneumothorax. HEART AND MEDIASTINUM: Aortic atherosclerosis. No acute abnormality of the cardiac and mediastinal silhouettes. BONES AND SOFT TISSUES: No acute osseous abnormality. IMPRESSION: 1. Streaky right suprahilar opacity persists, CTA neck yesterday negative aside from gas trapping. Favor atelectasis rather than developing infection. 2. No new cardiopulmonary abnormality. Electronically signed by: Helayne Hurst MD 03/18/2024 10:37 AM EST RP Workstation: [id number]   DG Chest Port 1 View Result Date: 03/18/2024 EXAM: 1 VIEW(S) XRAY OF THE CHEST 03/18/2024 01:45:55 AM COMPARISON: 07/17/2023 CLINICAL HISTORY: Shortness of breath. FINDINGS: LIMITATIONS/ARTIFACTS: Shallow inspiration. LUNGS AND PLEURA: Linear atelectasis in the left base and right mid lung. No focal consolidation. No pleural effusion. No pneumothorax. HEART AND MEDIASTINUM: Calcification of the aorta. BONES AND SOFT TISSUES: Degenerative changes in the spine and shoulders. IMPRESSION: 1. Linear atelectasis in the left base and right mid lung, which may contribute to shortness of breath. 2. No acute cardiopulmonary process identified. Electronically signed by: Elsie Gravely MD 03/18/2024 01:52 AM EST RP Workstation: [id number]   CT ANGIO HEAD NECK W WO CM Result Date: 03/17/2024 EXAM: CTA Head and Neck with Intravenous Contrast. CT Head without Contrast. CLINICAL HISTORY: Neuro deficit, acute, stroke suspected Neuro deficit, acute, stroke suspected TECHNIQUE: Axial CTA images of the head and neck performed with intravenous contrast. MIP reconstructed images were created and reviewed. Axial computed tomography images of the head/brain performed without intravenous  contrast. Note: Per PQRS, the description of internal carotid artery percent stenosis, including 0 percent or normal exam, is based on [employer] Symptomatic Carotid Endarterectomy Trial (NASCET) criteria. Dose reduction technique was used including one or more of the following: automated exposure control, adjustment of mA and kV according to patient  size, and/or iterative reconstruction. CONTRAST: With; COMPARISON: CT head November 09, 2023 FINDINGS: CT HEAD: BRAIN: Limited CT head with suggestion of hypodensity in the left basal ganglia (series 2 image 14). No acute intraparenchymal hemorrhage. No mass lesion. No midline shift or extra-axial collection. VENTRICLES: No hydrocephalus. ORBITS: The orbits are unremarkable. SINUSES AND MASTOIDS: The paranasal sinuses and mastoid air cells are clear. CTA NECK: COMMON CAROTID ARTERIES: No significant stenosis. No dissection or occlusion. INTERNAL CAROTID ARTERIES: No stenosis by NASCET criteria. No dissection or occlusion. VERTEBRAL ARTERIES: Moderate stenosis of the left vertebral artery origin. Occlusion versus severe stenosis of the nondominant distal right intradural vertebral artery. Dominant left vertebral artery is patent without significant stenosis. CTA HEAD: ANTERIOR CEREBRAL ARTERIES: No significant stenosis. No occlusion. No aneurysm. Hypoplastic right A1 ACA. MIDDLE CEREBRAL ARTERIES: Mild to moderate left M1 and proximal M2 MCA stenoses. No occlusion. No aneurysm. POSTERIOR CEREBRAL ARTERIES: Severe bilateral P2 PCA stenoses. No occlusion. No aneurysm. BASILAR ARTERY: No significant stenosis. No occlusion. No aneurysm. OTHER: SOFT TISSUES: No acute finding. No masses or lymphadenopathy. BONES: No acute osseous abnormality. IMPRESSION: 1. Limited CT head with hypodensity in the left basal ganglia that is suspicious for acute/recent infarct. Recommend MRI to assess for acute infarct. 2. Occluded versus severely stenotic distal right nondominant vertebral  artery. 3. Severe bilateral P2 PCA stenoses. 4. Moderate left vertebral artery origin stenosis. 5. Mild to moderate left M1 and M2 MCA stenoses. Electronically signed by: Gilmore Molt MD 03/17/2024 09:54 PM EST RP Workstation: [id number]   CT ABDOMEN PELVIS W CONTRAST Result Date: 03/17/2024 EXAM: CT ABDOMEN AND PELVIS WITH CONTRAST 03/17/2024 05:47:17 PM TECHNIQUE: CT of the abdomen and pelvis was performed with the administration of intravenous contrast. 100 mL of iohexol  (OMNIPAQUE ) 300 MG/ML solution was administered. Multiplanar reformatted images are provided for review. Automated exposure control, iterative reconstruction, and/or weight-based adjustment of the mA/kV was utilized to reduce the radiation dose to as low as reasonably achievable. COMPARISON: None available. CLINICAL HISTORY: LLQ abdominal pain. FINDINGS: LOWER CHEST: Treatment artifact in the lung bases. 4 mm nodule in the right costophrenic angle. LIVER: Fatty infiltration of the liver. GALLBLADDER AND BILE DUCTS: Gallbladder is unremarkable. No biliary ductal dilatation. SPLEEN: Circumscribed low attenuation lesion in the spleen measuring 3 cm diameter. This is likely a cyst or hemangioma. PANCREAS: No acute abnormality. ADRENAL GLANDS: No acute abnormality. KIDNEYS, URETERS AND BLADDER: Bilateral renal cysts. No stones in the kidneys or ureters. No hydronephrosis. No perinephric or periureteral stranding. Urinary bladder is unremarkable. Per consensus, no follow-up is needed for simple Bosniak type 1 and 2 renal cysts, unless the patient has a malignancy history or risk factors. GI AND BOWEL: Stomach demonstrates no acute abnormality. Diverticulosis of the sigmoid colon. No evidence of acute diverticulitis. There is no bowel obstruction. PERITONEUM AND RETROPERITONEUM: No ascites. No free air. VASCULATURE: Calcification of the aorta. No aneurysm. Aorta is normal in caliber. LYMPH NODES: No lymphadenopathy. REPRODUCTIVE ORGANS: No acute  abnormality. BONES AND SOFT TISSUES: Previous left hip arthroplasty. Degenerative changes in the spine. Degenerative changes in the right hip. Port. No acute osseous abnormality. No focal soft tissue abnormality. IMPRESSION: 1. No acute findings. 2. 4 mm right costophrenic angle pulmonary nodule; given incomplete chest coverage, per Fleischner Society Guidelines for incidental nodules 5 mm, no routine follow-up is recommended for low-risk or unknown-risk patients without high-risk nodule features; if high-risk for malignancy or if high-risk nodule features are present, an optional non-contrast chest CT at 12 months may be  considered; if performed and stable at 12 months, no further follow-up is needed. Electronically signed by: Elsie Gravely MD 03/17/2024 05:58 PM EST RP Workstation: [id number]               LOS: 1 day   Isabeau Mccalla  [facility]   Pager on [email or web address]. If 7PM-7AM, please contact night-coverage at [email or web address]     03/19/2024, 3:31 PM

## 2024-03-19 NOTE — TOC Initial Note (Addendum)
 Transition of Care [facility]) - Initial/Assessment Note    Patient Details  Name: Marie Pham MRN: [id number] Date of Birth: 11-03-1945  Transition of Care [facility]) CM/SW Contact:    Victory Jackquline RAMAN, RN Phone Number: 03/19/2024, 5:28 PM  Clinical Narrative: RNCM met with patient and family at bedside. RNCM introduced role and explained that discharge planning would be discussed. PT is recommending STR. Family is in agreement with STR and would like for the patient to go to a SNF in [place] or [place]. FL2 completed,PASRR# [id number] A Referrals for bed offers submitted. Patient's daughter Marie Pham works at the SNF in [employer]. A copy of the list of facilities [place] [id number] zip code left at the bedside with the daugher.  RNCM will continue to follow for discharge planning needs.     Expected Discharge Plan: Skilled Nursing Facility Barriers to Discharge: Continued Medical Work up   Patient Goals and CMS Choice            Expected Discharge Plan and Services     Post Acute Care Choice: Skilled Nursing Facility Living arrangements for the past 2 months: Single Family Home                                      Prior Living Arrangements/Services Living arrangements for the past 2 months: Single Family Home Lives with:: Self, Adult Children Patient language and need for interpreter reviewed:: Yes Do you feel safe going back to the place where you live?: Yes      Need for Family Participation in Patient Care: Yes (Comment) Care giver support system in place?: Yes (comment) Current home services: DME Criminal Activity/Legal Involvement Pertinent to Current Situation/Hospitalization: No - Comment as needed  Activities of Daily Living      Permission Sought/Granted                  Emotional Assessment Appearance:: Appears stated age, Well-Groomed Attitude/Demeanor/Rapport: Unresponsive, Lethargic Affect (typically observed): Unable to Assess  (Patient sleeping, Lethargic) Orientation: :  (Unable to Assess. Pt sleeping) Alcohol / Substance Use: Not Applicable Psych Involvement: No (comment)  Admission diagnosis:  Acute ischemic stroke [facility]) [I63.9] Acute CVA (cerebrovascular accident) [facility]) [I63.9] Patient Active Problem List   Diagnosis Date Noted   Severe aortic stenosis 03/19/2024   Pneumonitis 03/19/2024   AKI (acute kidney injury) 03/18/2024   Hypernatremia 03/18/2024   Abnormal EKG 03/18/2024   SIRS (systemic inflammatory response syndrome) (HCC) 03/18/2024   Acute CVA (cerebrovascular accident) (HCC) 03/17/2024   BP (high blood pressure) 10/02/2014   Asthma, chronic 10/02/2014   Essential hypertension 10/02/2014   Obesity 10/02/2014   Sleep apnea 10/02/2014   PCP:  Marie Domino, MD Pharmacy:   [facility] - [place], [place] - [address] [place] [address] Phone: [phone] Fax: [phone]  [id number] - [place], [place] - [address] [place] [address] Phone: [phone] Fax: [phone]     Social Drivers of Health (SDOH) Social History: SDOH Screenings   Food Insecurity: Patient Unable To Answer (03/18/2024)  Housing: Patient Unable To Answer (03/18/2024)  Transportation Needs: Patient Unable To Answer (03/18/2024)  Utilities: Patient Unable To Answer (03/18/2024)  Financial Resource Strain: High Risk (09/01/2023)   Received from [facility]  Social Connections: Patient Unable To Answer (03/18/2024)  Tobacco Use: Medium Risk (  03/17/2024)   SDOH Interventions:     Readmission Risk Interventions     No data to display

## 2024-03-19 NOTE — Plan of Care (Signed)
 Patient non verbal this time and NIHSS 36 throughout my shift. MD made aware.  Problem: Education Goal: Knowledge of General Education information will improve Description: Including pain rating scale, medication(s)/side effects and non-pharmacologic comfort measures Outcome: Progressing   Problem: Health Behavior/Discharge Planning: Goal: Ability to manage health-related needs will improve Outcome: Progressing   Problem: Clinical Measurements: Goal: Ability to maintain clinical measurements within normal limits will improve Outcome: Progressing   Problem: Activity: Goal: Risk for activity intolerance will decrease Outcome: Progressing   Problem: Nutrition: Goal: Adequate nutrition will be maintained Outcome: Progressing   Problem: Coping: Goal: Level of anxiety will decrease Outcome: Progressing   Problem: Elimination: Goal: Will not experience complications related to bowel motility Outcome: Progressing

## 2024-03-19 NOTE — NC FL2 (Signed)
 [place]  MEDICAID FL2 LEVEL OF CARE FORM     IDENTIFICATION  Patient Name: Marie Pham Birthdate: 11-18-1945 Sex: female Admission Date (Current Location): 03/17/2024  [facility] and [place] Number:  Chiropodist and Address:  [facility], [address], [place], [place] [address]      Provider Number: [id number]  Attending Physician Name and Address:  Jens Durand, MD  Relative Name and Phone Number:       Current Level of Care: Hospital Recommended Level of Care: Skilled Nursing Facility Prior Approval Number:    Date Approved/Denied:   PASRR Number: [id number]  Discharge Plan: SNF    Current Diagnoses: Patient Active Problem List   Diagnosis Date Noted   Severe aortic stenosis 03/19/2024   Pneumonitis 03/19/2024   AKI (acute kidney injury) 03/18/2024   Hypernatremia 03/18/2024   Abnormal EKG 03/18/2024   SIRS (systemic inflammatory response syndrome) (HCC) 03/18/2024   Acute CVA (cerebrovascular accident) (HCC) 03/17/2024   BP (high blood pressure) 10/02/2014   Asthma, chronic 10/02/2014   Essential hypertension 10/02/2014   Obesity 10/02/2014   Sleep apnea 10/02/2014    Orientation RESPIRATION BLADDER Height & Weight      (Unable to Assess)  O2 Incontinent Weight: 83.5 kg Height:  5' 5 (165.1 cm)  BEHAVIORAL SYMPTOMS/MOOD NEUROLOGICAL BOWEL NUTRITION STATUS      Incontinent  (Pending NGT)  AMBULATORY STATUS COMMUNICATION OF NEEDS Skin   Total Care Verbally Normal                       Personal Care Assistance Level of Assistance  Total care       Total Care Assistance: Maximum assistance   Functional Limitations Info             SPECIAL CARE FACTORS FREQUENCY  PT (By licensed PT), OT (By licensed OT)     PT Frequency: 3X/WEEK OT Frequency: EX/WEEK            Contractures Contractures Info: Not present    Additional Factors Info  Code Status Code Status Info: DNR Interven              Current Medications (03/19/2024):  This is the current hospital active medication list Current Facility-Administered Medications  Medication Dose Route Frequency Provider Last Rate Last Admin   0.45 % sodium chloride  infusion   Intravenous Continuous Jens Durand, MD 75 mL/hr at 03/19/24 1705 New Bag at 03/19/24 1705   acetaminophen  (TYLENOL ) tablet 650 mg  650 mg Oral Q4H PRN Duncan, Hazel V, MD       Or   acetaminophen  (TYLENOL ) 160 MG/5ML solution 650 mg  650 mg Per Tube Q4H PRN Duncan, Hazel V, MD       Or   acetaminophen  (TYLENOL ) suppository 650 mg  650 mg Rectal Q4H PRN Cleatus Delayne GAILS, MD   650 mg at 03/18/24 1723   aspirin  suppository 300 mg  300 mg Rectal Daily Jens Durand, MD   300 mg at 03/19/24 1235   azithromycin  (ZITHROMAX ) 500 mg in sodium chloride  0.9 % 250 mL IVPB  500 mg Intravenous Q24H Jens Durand, MD 250 mL/hr at 03/19/24 1710 500 mg at 03/19/24 1710   cefTRIAXone  (ROCEPHIN ) 1 g in sodium chloride  0.9 % 100 mL IVPB  1 g Intravenous Q24H Jens Durand, MD 200 mL/hr at 03/19/24 1148 1 g at 03/19/24 1148   enoxaparin  (LOVENOX ) injection 40 mg  40 mg Subcutaneous Q24H Niels Barrio  F, RPH   40 mg at 03/19/24 1222     Discharge Medications: Please see discharge summary for a list of discharge medications.  Relevant Imaging Results:  Relevant Lab Results:   Additional Information: SS: [id number]    Victory Jackquline RAMAN, RN

## 2024-03-19 NOTE — Consult Note (Signed)
 Consultation Note Date: 03/19/2024   Patient Name: Marie Pham  DOB: [birth year]  MRN: [id number]  Age / Sex: 78 y.o., female  PCP: Tobie Domino, MD Referring Physician: Jens Durand, MD  Reason for Consultation: Establishing goals of care   HPI/Brief Hospital Course: 78 y.o. female  with past medical history of hypertension, chronic back pain, depression and sleep apnea admitted from home on 03/17/2024 with initial complaints of worsening chronic back pain. Reportedly from family in ED, Marie Pham had poor PO intake and less activity day prior to admission. Last known well time established in ED >24 hours.  CTA obtained in ED revealed hypodensity in the left basal ganglia suspicious for acute/recent infarct recommendations for MRI to follow-up--occluded versus severely stenotic distal right nondominant vertebral artery--severe bilateral P2 PCA stenosis--- moderate left vertebral artery origin stenosis--- mild to moderate left M1 and M2 MCA stenoses MRI obtained and revealing confluent left ACA territory [name] acute infarct involving inferomedial left caudate nucleus, small area of acute infarct in the proximal right ACA territory and several subcentimeter foci of acute ischemia in the right distal MCA/ACA watershed--cytotoxic edema with no hemorrhagic transformation  CTA chest without evidence of acute pulmonary embolism--concern for possible air trapping or pneumonitis--noted cardiomegaly with coronary artery calcifications  Palliative medicine was consulted for assisting with goals of care conversations.  Subjective:  Extensive chart review has been completed prior to meeting patient including labs, vital signs, imaging, progress notes, orders, and available advanced directive documents from current and previous encounters.  Visited with Marie Pham at her bedside.  She is resting in bed with eyes closed, she does not respond to my presence in the room or  calling of her name.  She is unable to follow commands.  At time of initial visit daughter-in-law and bonus daughter at bedside.  Briefly introduced myself and role of palliative medicine.  Visitors at bedside shares Marie Pham in total has 3 biological children and 2 of them will soon be on the way to be visiting at the hospital.  PMT contact information left at bedside once family arrives requested they reach out to PMT and will resume goals of care conversations at that time.  Returned to bedside after receiving notification family members present.  We met outside of patient room in private conversation area.  Son-Marie Pham, daughter-Marie Pham and bonus daughter-Marie Pham present during conversation.  Introduced myself as a publishing rights manager as a member of the palliative care team. Explained palliative medicine is specialized medical care for people living with serious illness. It focuses on providing relief from the symptoms and stress of a serious illness. The goal is to improve quality of life for both the patient and the family.   Family provides a brief life review. Marie Pham is widowed. Has three biological children in total--Marie Pham, Marie Pham and another daughter--Marie Pham. Marie Pham shares Marie Pham and comprehends on a 5th grade reading level and is not able to be a participant in GOC conversations.They do not wish for provides to include her in medical discussions--they will discuss and explain things amongst themselves with Marie Pham. They share Marie Pham lived an active lifestyle. Over the last several years she has been a night owl and cat naps throughout the day. Shares she is most active during late hours of the evening.  Family able to share their understanding their understanding of current medical conditions. Aware Marie Pham has suffered from a stroke leaving her with difficulty speaking, following commands and safely swallowing. Nutrition is a major concern  to family. We discussed high risk of  aspiration post CVA as well of an even higher risk of aspiration is patient's unable to be alert and follow simple commands.  We discussed patient's current illness and what it means in the larger context of patient's on-going co-morbidities. Natural disease trajectory and expectations at EOL were discussed.   We discussed the difference between NGT/dobhoff versus PEG tube as inquired by family. We discussed risk of aspiration continues with both forms of tubes providing artificial nutrition. Family shares they were present when nursing staff attempted dobhoff placement, Marie Pham unable to tolerate and unsuccessful placement. We discussed need for SLP evaluation--family expresses frustrations around SLP not attempting evaluation due to Marie Pham sleeping--we again discussed meed to be awake, alert and able to follow commands to safely swallow. Son shares one night this admission, Marie Pham was awake, alert, following commands and swallowed a few drops of water  from moistening sponge.  We discussed code status and the difference between Full Code and Do Not Resuscitate in detail. Marie Pham and Marie Pham speak to not wanting to have Marie Pham suffer and the Marie Pham has always been clear she would not be accepting of artificial machines to keep her alive. Marie Pham Marie Pham and [name] decide DNR--with pre-arrest interventions. Order changed to reflect conversation and family decision.  Son inquired about medications that can be given in stroke patients to reverse effects of stroke. He is unsure of the name/type of medication. Discussed clot busting medication that is given in patients the have a LKW time of a few hours before presenting to the hospital. In ED it was determined LKW time was >24 hours. We discussed medications recommend by neurologist for stroke prevention such as ASA suppository and cholesterol medications when she is able to tolerate PO.  We discussed rehabilitation and expectations for Marie. Clift  to be able to return to a prior level of functioning if she is able to return to that state. Emphasized importance on quality versus quantity of life. At this time, family wishes to continue with current plan of care, hopeful for ongoing recovery and are interested in rehab at discharge.  I discussed importance of continued conversations with family/support persons and all members of their medical team regarding overall plan of care and treatment options ensuring decisions are in alignment with patients goals of care.  All questions/concerns addressed. Emotional support provided to patient/family/support persons. PMT will continue to follow and support patient as needed.  Objective: Primary Diagnoses: Present on Admission:  Acute CVA (cerebrovascular accident) [facility])  Essential hypertension   Physical Exam Constitutional:      Appearance: She is obese. She is ill-appearing.  HENT:     Head: Normocephalic.  Pulmonary:     Effort: Pulmonary effort is normal. No respiratory distress.  Abdominal:     General: There is no distension.     Palpations: Abdomen is soft.     Tenderness: There is no abdominal tenderness.  Skin:    General: Skin is warm and dry.  Neurological:     Mental Status: She is lethargic.     Comments: Unable to assess neurological status due to Marie. Crance inability to arouse and participate in assessment     Vital Signs: BP 130/87 (BP Location: Right Arm)   Pulse (!) 155   Temp 98.5 F (36.9 C) (Oral)   Resp 20   Ht 5' 5 (1.651 m)   Wt 83.5 kg   SpO2 99%   BMI 30.63 kg/m  Pain Scale: Faces   Pain Score: 0-No pain   IO: Intake/output summary:  Intake/Output Summary (Last 24 hours) at 03/19/2024 1612 Last data filed at 03/19/2024 0443 Gross per 24 hour  Intake --  Output 350 ml  Net -350 ml    LBM:   Baseline Weight: Weight: 83.5 kg Most recent weight: Weight: 83.5 kg       Assessment and Plan  SUMMARY OF RECOMMENDATIONS   DNR-pre arrest  interventions desired Nutrition--recommend possible IR placement of dobhoff, SLP to assess later in day Compare NIH scale from day shift to night shift to assess significance of difference   Palliative Prophylaxis:   Bowel Regimen, Delirium Protocol and Frequent Pain Assessment  Discussed With: Primary team, nursing staff, SLP and TOC   Thank you for this consult and allowing Palliative Medicine to participate in the care of Liah R. Koontz. Palliative medicine will continue to follow and assist as needed.   Time spent includes: Detailed review of medical records (labs, imaging, vital signs), medically appropriate exam (mental status, respiratory, cardiac, skin), discussed with treatment team, counseling and educating patient, family and staff, documenting clinical information, medication management and coordination of care.   Signed by: Waddell Lesches, DNP, AGNP-C Palliative Medicine    Please contact Palliative Medicine Team phone at [phone] for questions and concerns.  For individual provider: See Tr[place]

## 2024-03-19 NOTE — Progress Notes (Signed)
 This RN, Catheryn Ned, RN anfd Merlynn Sakai, LPN attempted to place a 14Fr Small bore NG tube at bedside, Family made aware prior of possible complications due to patient not being able to follow commands and one family member present at the time of insertion. We attempted to insert in the right nare but tube had to be removed prior to xray being taken due to continuous coughing. MD notified. Family notified.

## 2024-03-19 NOTE — Progress Notes (Signed)
 Notified by CCMD of Heartrate  fluctuating in 150's. At bedside resident was just turned and repositioned.  Family states,  Resident had been coughing before said clinical research associate entered.  Will Notify MD

## 2024-03-19 NOTE — Progress Notes (Signed)
 Marie Pham, son, requested to speak with me about goals of care.  He said he had discussions with his sister and they have decided to change CODE STATUS from DNR to full code.  He was still concerned that patient was not getting any food by mouth.  I explained that patient was still lethargic and was not ready to take any food by mouth unless a formal swallow evaluation has been completed.  Unfortunately speech therapist has not been able to properly assess her swallowing because of lethargy and inability to follow instructions. CODE STATUS has been changed in EPIC. Marie Pham and other family members are planning to have goals of care discussions with Waddell, NP, with palliative care team.

## 2024-03-20 ENCOUNTER — Inpatient Hospital Stay

## 2024-03-20 DIAGNOSIS — Z515 Encounter for palliative care: Secondary | ICD-10-CM | POA: Diagnosis not present

## 2024-03-20 DIAGNOSIS — I639 Cerebral infarction, unspecified: Secondary | ICD-10-CM | POA: Diagnosis not present

## 2024-03-20 LAB — CBC
HCT: 41.5 % (ref 36.0–46.0)
Hemoglobin: 13.2 g/dL (ref 12.0–15.0)
MCH: 28.8 pg (ref 26.0–34.0)
MCHC: 31.8 g/dL (ref 30.0–36.0)
MCV: 90.4 fL (ref 80.0–100.0)
Platelets: 275 K/uL (ref 150–400)
RBC: 4.59 MIL/uL (ref 3.87–5.11)
RDW: 14.2 % (ref 11.5–15.5)
WBC: 17.3 K/uL — ABNORMAL HIGH (ref 4.0–10.5)
nRBC: 0 % (ref 0.0–0.2)

## 2024-03-20 LAB — GLUCOSE, CAPILLARY
Glucose-Capillary: 152 mg/dL — ABNORMAL HIGH (ref 70–99)
Glucose-Capillary: 162 mg/dL — ABNORMAL HIGH (ref 70–99)

## 2024-03-20 LAB — RENAL FUNCTION PANEL
Albumin: 3.8 g/dL (ref 3.5–5.0)
Anion gap: 11 (ref 5–15)
BUN: 18 mg/dL (ref 8–23)
CO2: 25 mmol/L (ref 22–32)
Calcium: 9.2 mg/dL (ref 8.9–10.3)
Chloride: 117 mmol/L — ABNORMAL HIGH (ref 98–111)
Creatinine, Ser: 0.83 mg/dL (ref 0.44–1.00)
GFR, Estimated: >60 mL/min (ref >60)
Glucose, Bld: 96 mg/dL (ref 70–99)
Phosphorus: 3.5 mg/dL (ref 2.5–4.6)
Potassium: 3.9 mmol/L (ref 3.5–5.1)
Sodium: 153 mmol/L — ABNORMAL HIGH (ref 135–145)

## 2024-03-20 LAB — MAGNESIUM: Magnesium: 2.5 mg/dL — ABNORMAL HIGH (ref 1.7–2.4)

## 2024-03-20 MED ORDER — THIAMINE MONONITRATE 100 MG PO TABS
100.0000 mg | ORAL_TABLET | Freq: Every day | ORAL | Status: AC
  Administered 2024-03-20 – 2024-03-26 (×7): 100 mg
  Filled 2024-03-20 (×7): qty 1

## 2024-03-20 MED ORDER — PROSOURCE TF20 ENFIT COMPATIBL EN LIQD
60.0000 mL | Freq: Every day | ENTERAL | Status: DC
  Administered 2024-03-20 – 2024-04-04 (×15): 60 mL
  Filled 2024-03-20 (×16): qty 60

## 2024-03-20 MED ORDER — VITAL HP 1.0 CAL PO LIQD: 1000.0000 mL | ORAL | Status: DC

## 2024-03-20 MED ORDER — DEXTROSE 5 % IV SOLN: INTRAVENOUS | Status: DC

## 2024-03-20 MED ORDER — ADULT MULTIVITAMIN W/MINERALS CH
1.0000 | ORAL_TABLET | Freq: Every day | ORAL | Status: DC
  Administered 2024-03-20 – 2024-04-04 (×16): 1
  Filled 2024-03-20 (×16): qty 1

## 2024-03-20 MED ORDER — FREE WATER
200.0000 mL | Freq: Four times a day (QID) | Status: AC
  Administered 2024-03-20 – 2024-03-22 (×8): 200 mL

## 2024-03-20 MED ORDER — ASPIRIN 81 MG PO CHEW
81.0000 mg | CHEWABLE_TABLET | Freq: Every day | ORAL | Status: DC
  Administered 2024-03-20 – 2024-04-04 (×16): 81 mg via NASOGASTRIC
  Filled 2024-03-20 (×16): qty 1

## 2024-03-20 MED ORDER — CARVEDILOL 12.5 MG PO TABS
12.5000 mg | ORAL_TABLET | Freq: Two times a day (BID) | ORAL | Status: DC
  Administered 2024-03-20 – 2024-03-30 (×21): 12.5 mg via NASOGASTRIC
  Filled 2024-03-20 (×7): qty 1
  Filled 2024-03-20 (×4): qty 2
  Filled 2024-03-20 (×6): qty 1
  Filled 2024-03-20 (×2): qty 2
  Filled 2024-03-20: qty 1
  Filled 2024-03-20: qty 2

## 2024-03-20 MED ORDER — DEXTROSE 5 % IV SOLN: INTRAVENOUS | Status: AC

## 2024-03-20 MED ORDER — CLOPIDOGREL BISULFATE 75 MG PO TABS
75.0000 mg | ORAL_TABLET | Freq: Every day | ORAL | Status: DC
  Administered 2024-03-20 – 2024-03-24 (×5): 75 mg via NASOGASTRIC
  Filled 2024-03-20 (×5): qty 1

## 2024-03-20 MED ORDER — METOPROLOL TARTRATE 5 MG/5ML IV SOLN: 5.0000 mg | Freq: Once | INTRAVENOUS | Status: DC

## 2024-03-20 MED ORDER — ATORVASTATIN CALCIUM 80 MG PO TABS
80.0000 mg | ORAL_TABLET | Freq: Every day | ORAL | Status: DC
  Administered 2024-03-20 – 2024-04-04 (×16): 80 mg via NASOGASTRIC
  Filled 2024-03-20: qty 4
  Filled 2024-03-20 (×4): qty 1
  Filled 2024-03-20: qty 4
  Filled 2024-03-20 (×7): qty 1
  Filled 2024-03-20: qty 4
  Filled 2024-03-20: qty 1
  Filled 2024-03-20: qty 4
  Filled 2024-03-20 (×4): qty 1

## 2024-03-20 MED ORDER — OSMOLITE 1.5 CAL PO LIQD
1000.0000 mL | ORAL | Status: DC
  Administered 2024-03-20 – 2024-03-26 (×5): 1000 mL

## 2024-03-20 NOTE — Progress Notes (Signed)
 SLP Cancellation Note  Patient Details Name: Marie Pham MRN: [id number] DOB: 25-Apr-1946   Cancelled treatment:       Reason Eval/Treat Not Completed: Fatigue/lethargy limiting ability to participate;Medical issues which prohibited therapy;Patient not medically ready;Patient's level of consciousness  Pt resting in bed, not responding to touch/verbal cues. NG now in place. Son reporting that pt needs nutrition, education shared regarding use of NG for nutrition and medication administration, impact of level of alertness on safe PO intake, and continued recommendations for NPO. Son reported understanding. Additional oral care supplies provided to facilitate frequent oral care as able. Medical team updated. SLP will continue to follow.   Mariesha Venturella Clapp, MS, CCC-SLP Speech Language Pathologist Rehab Services; [facility] [phone] (ascom)   Careli Luzader J Clapp 03/20/2024, 1:38 PM

## 2024-03-20 NOTE — Procedures (Signed)
  IR BRIEF PROGRESS NOTE:  Successful fluoroscopic guided placement of Dobbhoff tube with tip within the gastric lumen. The tube is ready for immediate use.]   Electronically Signed: Carlin DELENA Griffon, PA-C 03/20/2024, 9:38 AM

## 2024-03-20 NOTE — Progress Notes (Signed)
   03/20/24 0442  Assess: MEWS Score  Temp (!) 97.4 F (36.3 C)  BP (!) 179/86  MAP (mmHg) 111  Pulse Rate (!) 118  Resp (!) 24  Level of Consciousness Responds to Voice  SpO2 98 %  O2 Device Nasal Cannula  Assess: MEWS Score  MEWS Temp 0  MEWS Systolic 0  MEWS Pulse 2  MEWS RR 1  MEWS LOC 1  MEWS Score 4  MEWS Score Color Red  Assess: if the MEWS score is Yellow or Red  Were vital signs accurate and taken at a resting state? Yes  Does the patient meet 2 or more of the SIRS criteria? Yes  Does the patient have a confirmed or suspected source of infection? Yes  MEWS guidelines implemented  Yes, red  Treat  MEWS Interventions Considered administering scheduled or prn medications/treatments as ordered  Take Vital Signs  Increase Vital Sign Frequency  Red: Q1hr x2, continue Q4hrs until patient remains green for 12hrs  Escalate  MEWS: Escalate Red: Discuss with charge nurse and notify provider. Consider notifying RRT. If remains red for 2 hours consider need for higher level of care  Notify: Charge Nurse/RN  Name of Charge Nurse/RN Notified Donna,H,RN  Provider Notification  Provider Name/Title Hazy Duncan,MD  Date Provider Notified 03/20/24  Time Provider Notified [phone]  Method of Notification Page  Notification Reason Other (Comment) (RED MEWS)  Provider response No new orders  Date of Provider Response 03/20/24  Time of Provider Response 0550  Assess: SIRS CRITERIA  SIRS Temperature  0  SIRS Respirations  1  SIRS Pulse 1  SIRS WBC 0  SIRS Score Sum  2

## 2024-03-20 NOTE — Progress Notes (Signed)
 Initial Nutrition Assessment  DOCUMENTATION CODES:   Obesity unspecified  INTERVENTION:   -Obtain new weight -TF via NGT:   Initiate Osmolite 1.5 @ 20 ml/hr and increase by 10 ml every 12 hours to goal rate of 50 ml/hr.   60 ml Prosource TF20 daily  200 ml free water  flush every 6 hours per MD  Tube feeding regimen provides 1880 kcal (100% of needs), 95 grams of protein, and 914 ml of H2O. Total free water : 1714 ml daily   -MVI with minerals daily via tube -100 mg thiamine  daily x 7 days via tube -Monitor Mg, K, and Phos and replete as needed secondary to high refeeding risk  -RD will follow for diet advancement and goals of care and provide further recommendations once these are established -If aggressive care is desired and patient unable to safely take PO's, may need to consider permanent feeding access (ex PEG) as patient would be unable to discharge from the hospital with NGT  NUTRITION DIAGNOSIS:   Inadequate oral intake related to inability to eat, dysphagia as evidenced by NPO status.  GOAL:   Patient will meet greater than or equal to 90% of their needs  MONITOR:   Diet advancement, TF tolerance  REASON FOR ASSESSMENT:   Consult Enteral/tube feeding initiation and management  ASSESSMENT:   78 y.o. female with medical history significant for Hypertension, arthritis, chronic back pain, ambulance at baseline with a walker being admitted for an acute CVA.  Patient admitted with acute CVA.   11/22- NPO with ice chips 11/23- bedside NGT placement attempted by RN, unsuccessful; per SLP unable to assess due to lethargy 11/24- NGT placed via fluoroscopy (tip of tube in lumen per PA note); TF initiated  Reviewed I/O's: +1.3 L x 24 hours and +2 L since admission  Per MD notes, patient with right sided weakness and aphasia. CTA chest and MRI of brain reveal acute stroke.   Patient sitting up in bed at time of visit. She did not arouse to voice or touch.    Patient's daughter present at bedside, who assisted with history. She shares that patient was in her usual states oif health until 03/17/24; at baseline patient lives alone and is able to fix her own coffee and meals- generally consumes 2-3 meals per day. Daughter noticed that functional status was changing, as she needed assistance to get up and she did not eat anything that day. She confirmed last PO intake was on 03/17/24.   She does not think patient has lost any weight. Noted last 3 weight readings are identical; suspect these may be stated weight instead of measured weights. RD will obtain new weight to better assess weight changes.   Patient daughter reports family is concerned about lack of nutrition. Plan to undergo NGT placement via fluoroscopy this morning. RD explained how patient will receive nutrition until medically able to take foods PO. Given multiple days of lack of PO intake, patient is at refeeding risk.   Recommendations discussed with RN, MD, SLP, TOC, and palliative care.   Palliative care following for goals of care discussions; family remains hopeful for improvement.   Medications reviewed and include lovenox  and dextrose  5% solution @ 125 ml/hr.   Lab Results  Component Value Date   HGBA1C 6.1 (H) 03/18/2024   PTA DM medications are none.   Labs reviewed: Na: 153, Mg: 2.5, K and Phos WDL.  NUTRITION - FOCUSED PHYSICAL EXAM:  Flowsheet Row Most Recent Value  Orbital Region No  depletion  Upper Arm Region No depletion  Thoracic and Lumbar Region No depletion  Buccal Region No depletion  Temple Region No depletion  Clavicle Bone Region No depletion  Clavicle and Acromion Bone Region No depletion  Scapular Bone Region No depletion  Dorsal Hand No depletion  Patellar Region No depletion  Anterior Thigh Region No depletion  Posterior Calf Region No depletion  Edema (RD Assessment) None  Hair Reviewed  Eyes Reviewed  Mouth Reviewed  Skin Reviewed  Nails  Reviewed    Diet Order:   Diet Order             Diet NPO time specified  Diet effective now                   EDUCATION NEEDS:   Education needs have been addressed  Skin:  Skin Assessment: Reviewed RN Assessment  Last BM:  Unknown  Height:   Ht Readings from Last 1 Encounters:  03/17/24 5' 5 (1.651 m)    Weight:   Wt Readings from Last 1 Encounters:  03/17/24 83.5 kg    Ideal Body Weight:  56.8 kg  BMI:  Body mass index is 30.63 kg/m.  Estimated Nutritional Needs:   Kcal:  1700-1900  Protein:  90-105 grams  Fluid:  1.7-1.9 L    Margery ORN, RD, LDN, CDCES Registered Dietitian III Certified Diabetes Care and Education Specialist If unable to reach this RD, please use RD Inpatient group chat on secure chat between hours of 8am-4 pm daily

## 2024-03-20 NOTE — Progress Notes (Signed)
 Progress Note    [name]  FMW:[id number] DOB: Mar 12, 1946  DOA: 03/17/2024 PCP: Tobie Domino, MD      Brief Narrative:    Medical records reviewed and are as summarized below:  Marie Pham is a 78 y.o. female with medical history significant for hypertension, chronic back pain, arthritis, sleep apnea, depression, asthma/bronchitis, ambulates with a walker at baseline who presented to the hospital because of poor oral intake and speech difficulties.  Family had tried to communicate with her and she appeared to understand what her son was saying but she could not respond.  Onset or duration of speech difficulty is not clear.  In the ED, apparently patient indicated that her back hurts.  Vitals in the ED: Temperature 98.7 F, respiratory rate 18, pulse 115, BP 120/99 oxygen saturation 95% on room air.  Troponins negative, mildly elevated D-dimer 0.92. EKG shows sinus tachycardia, right bundle branch block, prolonged QTc at 494  CT angiogram of head and neck showed acute left basal ganglia infarct.  Chest x-ray 1. Linear atelectasis in the left base and right mid lung, which may contribute to shortness of breath. 2. No acute cardiopulmonary process identified.   She was admitted to the hospital for acute stroke.   Assessment/Plan:   Principal Problem:   Acute CVA (cerebrovascular accident) (HCC) Active Problems:   AKI (acute kidney injury)   Hypernatremia   SIRS (systemic inflammatory response syndrome) (HCC)   Abnormal EKG   Essential hypertension   Severe aortic stenosis   Pneumonitis    Body mass index is 30.63 kg/m.  (Class I obesity)    Acute stroke with lethargy, right facial droop, right hemiparesis and aphasia: 2D echo showed EF estimated at 60 to 65%, grade 3 diastolic dysfunction, severe aortic valve stenosis. Start low-dose aspirin , Plavix  and Lipitor  to be given via NG tube.   PT and OT recommended discharge to SNF. She is still not alert  enough for swallow evaluation. S/p NG tube placement by IR today. Start enteral nutrition and free water  via NG tube.   SIRS, tachycardia, tachypnea, leukocytosis, pneumonitis:  Leukocytosis is improving, WBC down from 26.5-17.3. CTA chest did not show any evidence of pulmonary embolism but there is evidence of bilateral pneumonitis.  This is probably due to aspiration.  Continue IV ceftriaxone  and azithromycin    Severe aortic stenosis, grade 3 diastolic dysfunction: Will need outpatient follow-up with cardiologist.   Hypernatremia, dehydration: Sodium up from 150-153.  Start 5% dextrose  infusion.  Give free water  via NG tube.    AKI: Improved   Hypertension: Start carvedilol    Chronic back pain/lumbar radiculitis   Plan of care was discussed with [name], son and Leonor, daughter, at the bedside.  All their questions were answered.    Diet Order             Diet NPO time specified  Diet effective now                                  Consultants: Neurologist  Procedures: NG tube placement by IR on 03/20/2024    Medications:    aspirin   81 mg Per NG tube Daily   atorvastatin   80 mg Per NG tube Daily   carvedilol   12.5 mg Per NG tube BID WC   clopidogrel   75 mg Per NG tube Daily   enoxaparin  (LOVENOX ) injection  40 mg Subcutaneous Q24H  free water   200 mL Per Tube Q6H   Continuous Infusions:  azithromycin  Stopped (03/19/24 1810)   cefTRIAXone  (ROCEPHIN )  IV 200 mL/hr at 03/20/24 0402   dextrose        Anti-infectives (From admission, onward)    Start     Dose/Rate Route Frequency Ordered Stop   03/19/24 1630  azithromycin  (ZITHROMAX ) 500 mg in sodium chloride  0.9 % 250 mL IVPB        500 mg 250 mL/hr over 60 Minutes Intravenous Every 24 hours 03/19/24 1533     03/18/24 1045  cefTRIAXone  (ROCEPHIN ) 1 g in sodium chloride  0.9 % 100 mL IVPB        1 g 200 mL/hr over 30 Minutes Intravenous Every 24 hours 03/18/24 0945                 Family Communication/Anticipated D/C date and plan/Code Status   DVT prophylaxis: enoxaparin  (LOVENOX ) injection 40 mg Start: 03/18/24 1038     Code Status: Do not attempt resuscitation (DNR) PRE-ARREST INTERVENTIONS DESIRED  Family Communication: Plan discussed with [name] (son) and Nada) daughter)at the bedside. Disposition Plan: Plan to discharge to SNF   Status is: Inpatient Remains inpatient appropriate because: Acute stroke         Subjective:   Interval events noted.  Patient is minimally responsive and aphasic and cannot provide any history.  Family ([name] and [place]) at the bedside  Objective:    Vitals:   03/20/24 0442 03/20/24 0543 03/20/24 0638 03/20/24 1038  BP: (!) 179/86 (!) 179/85 (!) 154/82 (!) 179/86  Pulse: (!) 118 (!) 117 (!) 115 (!) 115  Resp: (!) 24 (!) 27 (!) 22 20  Temp: (!) 97.4 F (36.3 C) (!) 100.4 F (38 C) 98.6 F (37 C) 98.9 F (37.2 C)  TempSrc: Oral Axillary Oral Oral  SpO2: 98% 100% 99% 100%  Weight:      Height:       No data found.   Intake/Output Summary (Last 24 hours) at 03/20/2024 1227 Last data filed at 03/20/2024 0402 Gross per 24 hour  Intake 1310.77 ml  Output --  Net 1310.77 ml   Filed Weights   03/17/24 1509  Weight: 83.5 kg    Exam:  GEN: NAD SKIN: Warm and dry EYES: No pallor or icterus ENT: MMM, NG tube (DobHoff) in place CV: RRR PULM: CTA B ABD: soft, obese, NT, +BS CNS: Still lethargic, aphasic, right facial droop and right-sided hemiplegia. EXT: No edema or tenderness       Data Reviewed:   I have personally reviewed following labs and imaging studies:  Labs: Labs show the following:   Basic Metabolic Panel: Recent Labs  Lab 03/17/24 1624 03/18/24 1139 03/19/24 0404 03/20/24 0600  NA 147* 149* 150* 153*  K 4.0 4.4 4.0 3.9  CL 107 115* 113* 117*  CO2 25 20* 25 25  GLUCOSE 135* 108* 100* 96  BUN 25* 20 18 18   CREATININE 1.04* 0.89 0.92 0.83  CALCIUM  9.8 8.9  9.2 9.2  MG  --   --  2.5* 2.5*  PHOS  --   --  4.0 3.5   GFR Estimated Creatinine Clearance: 62.5 mL/min (by C-G formula based on SCr of 0.83 mg/dL). Liver Function Tests: Recent Labs  Lab 03/17/24 1624 03/19/24 0404 03/20/24 0600  AST 22 49*  --   ALT 12 29  --   ALKPHOS 117 108  --   BILITOT 0.7 0.7  --   PROT  8.3* 7.3  --   ALBUMIN 4.5 4.0 3.8   Recent Labs  Lab 03/17/24 1624  LIPASE 42   No results for input(s): AMMONIA in the last 168 hours. Coagulation profile No results for input(s): INR, PROTIME in the last 168 hours.  CBC: Recent Labs  Lab 03/17/24 1624 03/18/24 1139 03/19/24 0404 03/20/24 0600  WBC 9.4 15.7* 26.5* 17.3*  NEUTROABS 5.8  --   --   --   HGB 15.4* 14.2 13.6 13.2  HCT 47.4* 44.9 42.6 41.5  MCV 88.3 91.1 90.8 90.4  PLT 363 300 309 275   Cardiac Enzymes: No results for input(s): CKTOTAL, CKMB, CKMBINDEX, TROPONINI in the last 168 hours. BNP (last 3 results) No results for input(s): PROBNP in the last 8760 hours. CBG: No results for input(s): GLUCAP in the last 168 hours. D-Dimer: Recent Labs    03/18/24 0130  DDIMER 0.92*   Hgb A1c: Recent Labs    03/18/24 0107  HGBA1C 6.1*   Lipid Profile: Recent Labs    03/18/24 0326  CHOL 226*  HDL 38*  LDLCALC 168*  TRIG 103  CHOLHDL 6.0   Thyroid  function studies: No results for input(s): TSH, T4TOTAL, T3FREE, THYROIDAB in the last 72 hours.  Invalid input(s): FREET3 Anemia work up: No results for input(s): VITAMINB12, FOLATE, FERRITIN, TIBC, IRON, RETICCTPCT in the last 72 hours. Sepsis Labs: Recent Labs  Lab 03/17/24 1624 03/18/24 1139 03/19/24 0404 03/20/24 0600  WBC 9.4 15.7* 26.5* 17.3*    Microbiology No results found for this or any previous visit (from the past 240 hours).  Procedures and diagnostic studies:  DG Naso G Tube Plc W/Fl W/Rad Result Date: 03/20/2024 INDICATION: Protein-calorie malnutrition. EXAM: NASO G  TUBE PLACEMENT WITH FL AND WITH RAD COMPARISON:  None Available. CONTRAST:  None. FLUOROSCOPY TIME:  Radiation Exposure Index (as provided by the fluoroscopic device): 13.4 mGy Kerma COMPLICATIONS: None. PROCEDURE: The Dobbhoff tube was lubricated with viscous lidocaine inserted into the right nostril. Under intermittent fluoroscopic guidance, the Dobbhoff tube was advanced through the stomach. However, despite many attempts, the tube was not amenable to post-pyloric placement. A spot fluoroscopic image was saved for documentation purposes. The tube was affixed to the patient's nose with tape. The patient tolerated the procedure well without immediate postprocedural complication. FINDINGS: Successful fluoroscopic guided placement of Dobbhoff tube with tip within the gastric lumen. IMPRESSION: Successful fluoroscopic guided placement of Dobbhoff tube with tip within the gastric lumen. The tube is ready for immediate use. Performed by: Carlin Griffon, PA-C Supervised and interpreted by: Newell Eke, MD Electronically Signed   By: Newell Eke M.D.   On: 03/20/2024 10:06   CT Angio Chest Pulmonary Embolism (PE) W or WO Contrast Result Date: 03/18/2024 CLINICAL DATA:  Pulmonary embolism suspected, high probability. Shortness of breath. EXAM: CT ANGIOGRAPHY CHEST WITH CONTRAST TECHNIQUE: Multidetector CT imaging of the chest was performed using the standard protocol during bolus administration of intravenous contrast. Multiplanar CT image reconstructions and MIPs were obtained to evaluate the vascular anatomy. RADIATION DOSE REDUCTION: This exam was performed according to the departmental dose-optimization program which includes automated exposure control, adjustment of the mA and/or kV according to patient size and/or use of iterative reconstruction technique. CONTRAST:  75mL OMNIPAQUE  IOHEXOL  350 MG/ML SOLN COMPARISON:  11/09/2022, 01/12/2016. FINDINGS: Cardiovascular: Heart is mildly enlarged and there is a  trace pericardial effusion. Scattered coronary artery calcifications are present. There is atherosclerotic calcification of the aorta without evidence of aneurysm. Pulmonary trunk is normal  in caliber. No definite evidence of pulmonary embolism is seen. Examination is limited due to respiratory motion artifact. Mediastinum/Nodes: No mediastinal, hilar, or axillary lymphadenopathy is seen. The thyroid  gland, trachea, and esophagus are within normal limits. Lungs/Pleura: Hazy ground-glass attenuation is present in the lungs bilaterally. Atelectasis is noted bilaterally. No effusion or pneumothorax is seen. Multiple nodules are noted bilaterally, the largest measuring 4 mm in the right middle lobe, axial image 61, and 4 mm in the left upper lobe, axial image 40, unchanged from 2017 and likely benign. Upper Abdomen: Hyperdense material is present within the gallbladder likely representing excreted contrast from previous examination. Cysts are noted in the right kidney. No acute abnormality. Musculoskeletal: Degenerative changes are present in the thoracic spine. No acute osseous abnormality. Review of the MIP images confirms the above findings. IMPRESSION: 1. No evidence of pulmonary embolism. 2. Ground-glass attenuation in the lungs bilaterally, possible air trapping or pneumonitis. 3. Cardiomegaly with coronary artery calcifications. 4. Aortic atherosclerosis. Electronically Signed   By: Leita Birmingham M.D.   On: 03/18/2024 15:42   MR BRAIN WO CONTRAST Result Date: 03/18/2024 EXAM: MRI BRAIN WITHOUT CONTRAST 03/18/2024 12:53:00 PM TECHNIQUE: Multiplanar multisequence MRI of the head/brain was performed without the administration of intravenous contrast. COMPARISON: CT head and CTA head and neck 03/17/2024. Brain MRI 07/17/2023. CLINICAL HISTORY: 78 year old female with acute neuro deficit, stroke suspected. FINDINGS: BRAIN: Confluent restricted diffusion throughout much of the left ACA territory (series 5 image 36,  series 7 image 28) with T2 and FLAIR hyperintense cytotoxic edema. Similar intense restricted diffusion also in the anterior and inferior caudate nucleus, corresponding to head CT finding yesterday. Caudate is mildly expanded by cytotoxic edema. No hemorrhagic transformation. There is also subtle contralateral proximal right ACA territory infarct with restricted diffusion and cytotoxic edema affecting the right inferior frontal gyrus (series 5 image 24, series 9 image 24). And Faint right superior frontal gyrus white matter subcentimeter restricted diffusion (series 5 image 39) is noted in the right MCA / ACA watershed territory. Chronic periventricular and scattered cerebral white matter T2 and FLAIR hyperintensity is moderate for age and stable outside of the acute findings. Minimal chronic microhemorrhage suspected in the brain such as left occipital lobe series 12 image 27. Chronic lacunar infarct in the ventral right thalamus. No significant intracranial mass effect. No midline shift. No hydrocephalus. The sella is unremarkable. Normal flow voids. Mediovascular phleboliths at the skull base are preserved. No other diffusion restriction. Brainstem and cerebellum are stable. ORBITS: No acute abnormality. SINUSES AND MASTOIDS: Stable mild left mastoid effusion. Negative visible nasopharynx. BONES AND SOFT TISSUES: Hyperostosis of the calvarium, normal variant. Normal marrow signal. No acute soft tissue abnormality. Negative visible cervical spine. IMPRESSION: 1. Confluent Left ACA territory acute infarct, including involvement of the inferomedial left caudate nucleus. Additionally, small area of acute infarct in the proximal right ACA territory, and several sub centimeter foci of acute ischemia in the distal right MCA/ACA watershed. 2. Cytotoxic edema with no hemorrhagic transformation or significant mass effect. 3. Elsewhere stable chronic small vessel disease. Electronically signed by: Helayne Hurst MD 03/18/2024  01:04 PM EST RP Workstation: [id number]               LOS: 2 days   Torin Whisner  [facility]   Pager on [email or web address]. If 7PM-7AM, please contact night-coverage at [email or web address]     03/20/2024, 12:27 PM

## 2024-03-20 NOTE — Progress Notes (Signed)
 Daily Progress Note   Patient Name: Marie Pham       Date: 03/20/2024 DOB: [birth year]  Age: 78 y.o. MRN#: [id number] Attending Physician: Jens Durand, MD Primary Care Physician: Tobie Domino, MD Admit Date: 03/17/2024  Reason for Consultation/Follow-up: Establishing goals of care  Patient Profile/HPI:  78 y.o. female  with past medical history of hypertension, chronic back pain, depression and sleep apnea admitted from home on 03/17/2024 with initial complaints of worsening chronic back pain. Reportedly from family in ED, [name] had poor PO intake and less activity day prior to admission. Last known well time established in ED >24 hours.   CTA obtained in ED revealed hypodensity in the left basal ganglia suspicious for acute/recent infarct recommendations for MRI to follow-up--occluded versus severely stenotic distal right nondominant vertebral artery--severe bilateral P2 PCA stenosis--- moderate left vertebral artery origin stenosis--- mild to moderate left M1 and M2 MCA stenoses MRI obtained and revealing confluent left ACA territory [name] acute infarct involving inferomedial left caudate nucleus, small area of acute infarct in the proximal right ACA territory and several subcentimeter foci of acute ischemia in the right distal MCA/ACA watershed--cytotoxic edema with no hemorrhagic transformation   CTA chest without evidence of acute pulmonary embolism--concern for possible air trapping or pneumonitis--noted cardiomegaly with coronary artery calcifications   Palliative medicine was consulted for assisting with goals of care conversations.    Subjective: Chart reviewed including labs, progress notes, imaging from this and previous encounters.  Cortrak has been placed.  On eval  patient does not respond to voice or touch. Son is at bedside.  Emotional support provided as son voiced noting that his mom doesn't seem to be improving although he notes her vitals are strong.  We discussed how the brain is also a vital organ and Lenix's brain has been significantly damaged. Acknowledged the difficulty in seeing the incongruence in her vital signs and her mental state.  [name] shared that he and family continue to be hopeful for improvement and continue to pray for her recovery.   Review of Systems  Unable to perform ROS: Intubated     Physical Exam Vitals and nursing note reviewed.  Constitutional:      Appearance: She is ill-appearing.  Cardiovascular:     Rate and Rhythm: Normal rate.  Neurological:  Comments: Non verbal, does not wake to my voice or touch             Vital Signs: BP (!) 179/86 (BP Location: Right Arm)   Pulse (!) 115   Temp 98.9 F (37.2 C) (Oral)   Resp 20   Ht 5' 5 (1.651 m)   Wt 83.5 kg   SpO2 100%   BMI 30.63 kg/m  SpO2: SpO2: 100 % O2 Device: O2 Device: Nasal Cannula O2 Flow Rate: O2 Flow Rate (L/min): 2 L/min  Intake/output summary:  Intake/Output Summary (Last 24 hours) at 03/20/2024 1134 Last data filed at 03/20/2024 0402 Gross per 24 hour  Intake 1310.77 ml  Output --  Net 1310.77 ml   LBM:   Baseline Weight: Weight: 83.5 kg Most recent weight: Weight: 83.5 kg       Palliative Assessment/Data: PPS: 10% (with cortrak in place)      Patient Active Problem List   Diagnosis Date Noted   Severe aortic stenosis 03/19/2024   Pneumonitis 03/19/2024   AKI (acute kidney injury) 03/18/2024   Hypernatremia 03/18/2024   Abnormal EKG 03/18/2024   SIRS (systemic inflammatory response syndrome) (HCC) 03/18/2024   Acute CVA (cerebrovascular accident) (HCC) 03/17/2024   BP (high blood pressure) 10/02/2014   Asthma, chronic 10/02/2014   Essential hypertension 10/02/2014   Obesity 10/02/2014   Sleep apnea 10/02/2014     Palliative Care Assessment & Plan    Assessment/Recommendations/Plan  Severe stroke- NIH 36 at 1600 yesterday and 35 at 0015, 35 at 0549- no difference in night and day measurements per family request Family continues to request current interventions- limit  set at DNR   Code Status:   Code Status: Do not attempt resuscitation (DNR) PRE-ARREST INTERVENTIONS DESIRED   Prognosis:  Unable to determine  Discharge Planning: To Be Determined  Care plan was discussed with patient's son.   Thank you for allowing the Palliative Medicine Team to assist in the care of this patient.  Total time:  50 minutes Prolonged billing:  Time includes:   Preparing to see the patient (e.g., review of tests) Obtaining and/or reviewing separately obtained history Performing a medically necessary appropriate examination and/or evaluation Counseling and educating the patient/family/caregiver Ordering medications, tests, or procedures Referring and communicating with other health care professionals (when not reported separately) Documenting clinical information in the electronic or other health record Independently interpreting results (not reported separately) and communicating results to the patient/family/caregiver Care coordination (not reported separately) Clinical documentation  Cassondra Stain, AGNP-C Palliative Medicine   Please contact Palliative Medicine Team phone at [phone] for questions and concerns.

## 2024-03-20 NOTE — Progress Notes (Signed)
 Patient lethargic this shift, opened eyes to voice at times, continued NIH, no pain noted. Patient coughing thick white sputum this shift, suction x1. Patient a red MEWS this morning, hospitalist notified, no new orders, will continue to monitor.

## 2024-03-20 NOTE — TOC Progression Note (Signed)
 Transition of Care [facility]) - Progression Note    Patient Details  Name: Marie Pham MRN: [id number] Date of Birth: November 16, 1945  Transition of Care [facility]) CM/SW Contact  Dalia GORMAN Fuse, RN Phone Number: 03/20/2024, 2:15 PM  Clinical Narrative:    TOC spoke with the patient's daughter and son in the room and introduced her role. The family is not open to discharge planning at this time. TOC advised that she would continue to follow the patient while she was in the hospital and would outreach to them when the patient is closer to being medically ready.  TOC will continue to follow.   Expected Discharge Plan: Skilled Nursing Facility Barriers to Discharge: Continued Medical Work up               Expected Discharge Plan and Services     Post Acute Care Choice: Skilled Nursing Facility Living arrangements for the past 2 months: Single Family Home                                       Social Drivers of Health (SDOH) Interventions SDOH Screenings   Food Insecurity: Patient Unable To Answer (03/18/2024)  Housing: Patient Unable To Answer (03/18/2024)  Transportation Needs: Patient Unable To Answer (03/18/2024)  Utilities: Patient Unable To Answer (03/18/2024)  Financial Resource Strain: High Risk (09/01/2023)   Received from [facility]  Social Connections: Patient Unable To Answer (03/18/2024)  Tobacco Use: Medium Risk (03/17/2024)    Readmission Risk Interventions     No data to display

## 2024-03-21 ENCOUNTER — Inpatient Hospital Stay

## 2024-03-21 DIAGNOSIS — Z7189 Other specified counseling: Secondary | ICD-10-CM | POA: Diagnosis not present

## 2024-03-21 DIAGNOSIS — E870 Hyperosmolality and hypernatremia: Secondary | ICD-10-CM | POA: Diagnosis not present

## 2024-03-21 DIAGNOSIS — I639 Cerebral infarction, unspecified: Secondary | ICD-10-CM | POA: Diagnosis not present

## 2024-03-21 DIAGNOSIS — Z515 Encounter for palliative care: Secondary | ICD-10-CM | POA: Diagnosis not present

## 2024-03-21 LAB — BLOOD GAS, ARTERIAL
Acid-Base Excess: 4.5 mmol/L — ABNORMAL HIGH (ref 0.0–2.0)
Bicarbonate: 30.4 mmol/L — ABNORMAL HIGH (ref 20.0–28.0)
O2 Saturation: 99.4 %
Patient temperature: 37
pCO2 arterial: 49 mmHg — ABNORMAL HIGH (ref 32–48)
pH, Arterial: 7.4 (ref 7.35–7.45)
pO2, Arterial: 106 mmHg (ref 83–108)

## 2024-03-21 LAB — CBC
HCT: 39.1 % (ref 36.0–46.0)
Hemoglobin: 12.4 g/dL (ref 12.0–15.0)
MCH: 28.6 pg (ref 26.0–34.0)
MCHC: 31.7 g/dL (ref 30.0–36.0)
MCV: 90.1 fL (ref 80.0–100.0)
Platelets: 248 K/uL (ref 150–400)
RBC: 4.34 MIL/uL (ref 3.87–5.11)
RDW: 13.9 % (ref 11.5–15.5)
WBC: 13.1 K/uL — ABNORMAL HIGH (ref 4.0–10.5)
nRBC: 0 % (ref 0.0–0.2)

## 2024-03-21 LAB — MAGNESIUM: Magnesium: 2.4 mg/dL (ref 1.7–2.4)

## 2024-03-21 LAB — BASIC METABOLIC PANEL WITH GFR
Anion gap: 8 (ref 5–15)
BUN: 19 mg/dL (ref 8–23)
CO2: 28 mmol/L (ref 22–32)
Calcium: 9 mg/dL (ref 8.9–10.3)
Chloride: 110 mmol/L (ref 98–111)
Creatinine, Ser: 0.88 mg/dL (ref 0.44–1.00)
GFR, Estimated: >60 mL/min (ref >60)
Glucose, Bld: 153 mg/dL — ABNORMAL HIGH (ref 70–99)
Potassium: 4 mmol/L (ref 3.5–5.1)
Sodium: 146 mmol/L — ABNORMAL HIGH (ref 135–145)

## 2024-03-21 LAB — GLUCOSE, CAPILLARY
Glucose-Capillary: 146 mg/dL — ABNORMAL HIGH (ref 70–99)
Glucose-Capillary: 141 mg/dL — ABNORMAL HIGH (ref 70–99)
Glucose-Capillary: 130 mg/dL — ABNORMAL HIGH (ref 70–99)
Glucose-Capillary: 138 mg/dL — ABNORMAL HIGH (ref 70–99)
Glucose-Capillary: 146 mg/dL — ABNORMAL HIGH (ref 70–99)
Glucose-Capillary: 156 mg/dL — ABNORMAL HIGH (ref 70–99)

## 2024-03-21 LAB — PHOSPHORUS: Phosphorus: 3.4 mg/dL (ref 2.5–4.6)

## 2024-03-21 NOTE — Progress Notes (Addendum)
 Progress Note    Marie Pham  FMW:[id number] DOB: [birth year]  DOA: 03/17/2024 PCP: Tobie Domino, MD      Brief Narrative:    Medical records reviewed and are as summarized below:  Marie Pham is a 78 y.o. female with medical history significant for hypertension, chronic back pain, arthritis, sleep apnea, depression, asthma/bronchitis, ambulates with a walker at baseline who presented to the hospital because of poor oral intake and speech difficulties.  Family had tried to communicate with her and she appeared to understand what her son was saying but she could not respond.  Onset or duration of speech difficulty is not clear.  In the ED, apparently patient indicated that her back hurts.  Vitals in the ED: Temperature 98.7 F, respiratory rate 18, pulse 115, BP 120/99 oxygen saturation 95% on room air.  Troponins negative, mildly elevated D-dimer 0.92. EKG shows sinus tachycardia, right bundle branch block, prolonged QTc at 494  CT angiogram of head and neck showed acute left basal ganglia infarct.  Chest x-ray 1. Linear atelectasis in the left base and right mid lung, which may contribute to shortness of breath. 2. No acute cardiopulmonary process identified.   She was admitted to the hospital for acute stroke.   Assessment/Plan:   Principal Problem:   Acute CVA (cerebrovascular accident) (HCC) Active Problems:   AKI (acute kidney injury)   Hypernatremia   SIRS (systemic inflammatory response syndrome) (HCC)   Abnormal EKG   Essential hypertension   Severe aortic stenosis   Pneumonitis    Body mass index is 31.15 kg/m.  (Class I obesity)    Acute stroke with lethargy, right facial droop, right hemiparesis and aphasia: 2D echo showed EF estimated at 60 to 65%, grade 3 diastolic dysfunction, severe aortic valve stenosis. Continue low-dose aspirin , Plavix  and Lipitor  via NG tube PT and OT recommended discharge to SNF. She is still not alert enough for  swallow evaluation. S/p NG tube placement by IR on 03/20/2024 Continue enteral nutrition via NG tube current ube.   SIRS, tachycardia, tachypnea, leukocytosis, pneumonitis:  Leukocytosis is improving, WBC down from 26.5-17.3-13.1. Repeat chest x-ray on 03/21/2024 because rapid response was called in the early hours of the morning for tachypnea.  Chest x-ray showed low lung volumes with bronchovascular crowding but there was no focal consolidation.  CTA chest on11/22/2025 did not show any evidence of pulmonary embolism but there is evidence of bilateral pneumonitis.  This is probably due to aspiration.  Continue IV ceftriaxone  and azithromycin    Severe aortic stenosis, grade 3 diastolic dysfunction: Will need outpatient follow-up with cardiologist.   Hypernatremia, dehydration: Improved.  Sodium down from 153-146.   Continue free water . AKI ruled out.   Hypertension: BP is getting better.  Continue carvedilol .   Chronic back pain/lumbar radiculitis   Plan of care was discussed with [name] (son), [name] (daughter) and the granddaughter at the bedside.  I spent about 1 hour explaining diagnoses, prognosis and potential complications from acute stroke.  I explained that patients with mild forms of stroke have higher chance of recovery and less complications compared to those with severe forms of stroke, and patients with severe forms of stroke have lower chance of meaningful full recovery.  However, I emphasized that I could not predict the trajectory of her illness.  It is possible that she may regain some function but this might require extensive rehabilitation. I also explained my concern for high risk for aspiration given persistent lethargy and  aphasia.  I explained that feeding tube, though useful for enteral nutrition and medications, does not eliminate the risk for aspiration.  They think that care was delayed in the ED and patient could have been a candidate for tPA.  They said  they have read a lot about strokes. I explained that it was documented in the chart that patient was last known normal over 24 hours, and that given tPA beyond the recommended timeframe could potentially have serious complications but with little benefit.  [name], NP, with palliative care team walked into the room during this encounter with the family.    [name], daughter, said she was unhappy about nursing care.  She said that she had to wait for about 2 hours for somebody to come to the room after she pressed the call button yesterday.  I told her I will inform the charge nurse about this.  [name] RN, press photographer, has been notified.  Diet Order             Diet NPO time specified  Diet effective now                                  Consultants: Neurologist  Procedures: NG tube placement by IR on 03/20/2024    Medications:    aspirin   81 mg Per NG tube Daily   atorvastatin   80 mg Per NG tube Daily   carvedilol   12.5 mg Per NG tube BID WC   clopidogrel   75 mg Per NG tube Daily   enoxaparin  (LOVENOX ) injection  40 mg Subcutaneous Q24H   feeding supplement (PROSource TF20)  60 mL Per Tube Daily   free water   200 mL Per Tube Q6H   multivitamin with minerals  1 tablet Per Tube Daily   thiamine   100 mg Per Tube Daily   Continuous Infusions:  azithromycin  Stopped (03/20/24 1725)   cefTRIAXone  (ROCEPHIN )  IV 1 g (03/21/24 1143)   feeding supplement (OSMOLITE 1.5 CAL) 50 mL/hr at 03/21/24 0343     Anti-infectives (From admission, onward)    Start     Dose/Rate Route Frequency Ordered Stop   03/19/24 1630  azithromycin  (ZITHROMAX ) 500 mg in sodium chloride  0.9 % 250 mL IVPB        500 mg 250 mL/hr over 60 Minutes Intravenous Every 24 hours 03/19/24 1533     03/18/24 1045  cefTRIAXone  (ROCEPHIN ) 1 g in sodium chloride  0.9 % 100 mL IVPB        1 g 200 mL/hr over 30 Minutes Intravenous Every 24 hours 03/18/24 0945                Family  Communication/Anticipated D/C date and plan/Code Status   DVT prophylaxis: enoxaparin  (LOVENOX ) injection 40 mg Start: 03/18/24 1038     Code Status: Do not attempt resuscitation (DNR) PRE-ARREST INTERVENTIONS DESIRED  Family Communication: Plan discussed with [name] (son) and Nada) daughter)at the bedside. Disposition Plan: Plan to discharge to SNF   Status is: Inpatient Remains inpatient appropriate because: Acute stroke         Subjective:   Interval events noted.  Patient is lethargic and aphasic and cannot provide any history.  Family ([name], [name] and a granddaughter) at bedside.  Family reports that patient has been requiring suctioning.  Objective:    Vitals:   03/21/24 0500 03/21/24 0755 03/21/24 1207 03/21/24 1221  BP:  (!) 175/79 139/66  Pulse:  94 83   Resp:  (!) 32 (!) 26 (!) 30  Temp:  98.2 F (36.8 C) 98.2 F (36.8 C)   TempSrc:  Oral Oral   SpO2:  98% 100%   Weight: 84.9 kg     Height:       No data found.   Intake/Output Summary (Last 24 hours) at 03/21/2024 1623 Last data filed at 03/21/2024 1144 Gross per 24 hour  Intake 2693.06 ml  Output --  Net 2693.06 ml   Filed Weights   03/17/24 1509 03/21/24 0500  Weight: 83.5 kg 84.9 kg    Exam:  GEN: NAD SKIN: Warm and dry EYES: No pallor or icterus, with equal round and reactive to light ENT: MMM CV: RRR PULM: No wheezing or rales. ABD: soft, ND, NT, +BS CNS: Lethargic, aphasic, right hemiplegia, does not follow commands EXT: No edema or tenderness      Data Reviewed:   I have personally reviewed following labs and imaging studies:  Labs: Labs show the following:   Basic Metabolic Panel: Recent Labs  Lab 03/17/24 1624 03/18/24 1139 03/19/24 0404 03/20/24 0600 03/21/24 0452  NA 147* 149* 150* 153* 146*  K 4.0 4.4 4.0 3.9 4.0  CL 107 115* 113* 117* 110  CO2 25 20* 25 25 28   GLUCOSE 135* 108* 100* 96 153*  BUN 25* 20 18 18 19   CREATININE 1.04* 0.89 0.92 0.83 0.88   CALCIUM  9.8 8.9 9.2 9.2 9.0  MG  --   --  2.5* 2.5* 2.4  PHOS  --   --  4.0 3.5 3.4   GFR Estimated Creatinine Clearance: 59.5 mL/min (by C-G formula based on SCr of 0.88 mg/dL). Liver Function Tests: Recent Labs  Lab 03/17/24 1624 03/19/24 0404 03/20/24 0600  AST 22 49*  --   ALT 12 29  --   ALKPHOS 117 108  --   BILITOT 0.7 0.7  --   PROT 8.3* 7.3  --   ALBUMIN 4.5 4.0 3.8   Recent Labs  Lab 03/17/24 1624  LIPASE 42   No results for input(s): AMMONIA in the last 168 hours. Coagulation profile No results for input(s): INR, PROTIME in the last 168 hours.  CBC: Recent Labs  Lab 03/17/24 1624 03/18/24 1139 03/19/24 0404 03/20/24 0600 03/21/24 0452  WBC 9.4 15.7* 26.5* 17.3* 13.1*  NEUTROABS 5.8  --   --   --   --   HGB 15.4* 14.2 13.6 13.2 12.4  HCT 47.4* 44.9 42.6 41.5 39.1  MCV 88.3 91.1 90.8 90.4 90.1  PLT 363 300 309 275 248   Cardiac Enzymes: No results for input(s): CKTOTAL, CKMB, CKMBINDEX, TROPONINI in the last 168 hours. BNP (last 3 results) No results for input(s): PROBNP in the last 8760 hours. CBG: Recent Labs  Lab 03/20/24 2052 03/20/24 2351 03/21/24 0402 03/21/24 0840 03/21/24 1207  GLUCAP 152* 162* 146* 141* 130*   D-Dimer: No results for input(s): DDIMER in the last 72 hours.  Hgb A1c: No results for input(s): HGBA1C in the last 72 hours.  Lipid Profile: No results for input(s): CHOL, HDL, LDLCALC, TRIG, CHOLHDL, LDLDIRECT in the last 72 hours.  Thyroid  function studies: No results for input(s): TSH, T4TOTAL, T3FREE, THYROIDAB in the last 72 hours.  Invalid input(s): FREET3 Anemia work up: No results for input(s): VITAMINB12, FOLATE, FERRITIN, TIBC, IRON, RETICCTPCT in the last 72 hours. Sepsis Labs: Recent Labs  Lab 03/18/24 1139 03/19/24 0404 03/20/24 0600 03/21/24 0452  WBC  15.7* 26.5* 17.3* 13.1*    Microbiology No results found for this or any previous visit  (from the past 240 hours).  Procedures and diagnostic studies:  DG Chest Port 1 View Result Date: 03/21/2024 CLINICAL DATA:  Pneumonia EXAM: PORTABLE CHEST 1 VIEW COMPARISON:  Chest radiograph dated 03/18/2024 FINDINGS: Lines/tubes: Gastric/enteric tube tip projects over the stomach. Lungs: Low lung volumes with bronchovascular crowding. No focal consolidation. Pleura: No pneumothorax or pleural effusion. Heart/mediastinum: The heart size and mediastinal contours are within normal limits. Bones: No acute osseous abnormality. IMPRESSION: Low lung volumes with bronchovascular crowding. No focal consolidation. Electronically Signed   By: Limin  Xu M.D.   On: 03/21/2024 08:50   DG [place]ana MATSU Tube Plc W/Fl W/Rad Result Date: 03/20/2024 INDICATION: Protein-calorie malnutrition. EXAM: NASO G TUBE PLACEMENT WITH FL AND WITH RAD COMPARISON:  None Available. CONTRAST:  None. FLUOROSCOPY TIME:  Radiation Exposure Index (as provided by the fluoroscopic device): 13.4 mGy Kerma COMPLICATIONS: None. PROCEDURE: The Dobbhoff tube was lubricated with viscous lidocaine inserted into the right nostril. Under intermittent fluoroscopic guidance, the Dobbhoff tube was advanced through the stomach. However, despite many attempts, the tube was not amenable to post-pyloric placement. A spot fluoroscopic image was saved for documentation purposes. The tube was affixed to the patient's nose with tape. The patient tolerated the procedure well without immediate postprocedural complication. FINDINGS: Successful fluoroscopic guided placement of Dobbhoff tube with tip within the gastric lumen. IMPRESSION: Successful fluoroscopic guided placement of Dobbhoff tube with tip within the gastric lumen. The tube is ready for immediate use. Performed by: Carlin Griffon, PA-C Supervised and interpreted by: Newell Eke, MD Electronically Signed   By: Newell Eke M.D.   On: 03/20/2024 10:06               LOS: 3 days   Kage Willmann  [facility]   Pager on [email or web address]. If 7PM-7AM, please contact night-coverage at [email or web address]     03/21/2024, 4:23 PM

## 2024-03-21 NOTE — Progress Notes (Signed)
 SLP Cancellation Note  Patient Details Name: Marie Pham MRN: [id number] DOB: 12-23-1945   Cancelled treatment:       Reason Eval/Treat Not Completed: Medical issues which prohibited therapy  Per chart - pt requiring suctioning and RR called.   Palliative Care discussing PEG, external nutrition provided by NG. ST services to sign off d/t  continued inability to participate in services.      Marnee Sherrard B. Rubbie, M.S., CCC-SLP, CBIS Speech-Language Pathologist Certified Brain Injury Specialist [facility]  [facility] [phone] Ascom [phone] Fax [phone]   Claudetta Rubbie 03/21/2024, 12:37 PM

## 2024-03-21 NOTE — Plan of Care (Signed)
  Problem: Clinical Measurements: Goal: Will remain free from infection Outcome: Progressing   Problem: Pain Managment: Goal: General experience of comfort will improve and/or be controlled Outcome: Progressing   Problem: Skin Integrity: Goal: Risk for impaired skin integrity will decrease Outcome: Progressing

## 2024-03-21 NOTE — Progress Notes (Signed)
 Daily Progress Note   Patient Name: Marie Pham       Date: 03/21/2024 DOB: 07-Mar-1946  Age: 78 y.o. MRN#: [id number] Attending Physician: Jens Durand, MD Primary Care Physician: Tobie Domino, MD Admit Date: 03/17/2024  Reason for Consultation/Follow-up: Establishing goals of care  Patient Profile/HPI:  78 y.o. female  with past medical history of hypertension, chronic back pain, depression and sleep apnea admitted from home on 03/17/2024 with initial complaints of worsening chronic back pain. Reportedly from family in ED, Marie Pham had poor PO intake and less activity day prior to admission. Last known well time established in ED >24 hours.   CTA obtained in ED revealed hypodensity in the left basal ganglia suspicious for acute/recent infarct recommendations for MRI to follow-up--occluded versus severely stenotic distal right nondominant vertebral artery--severe bilateral P2 PCA stenosis--- moderate left vertebral artery origin stenosis--- mild to moderate left M1 and M2 MCA stenoses MRI obtained and revealing confluent left ACA territory [name] acute infarct involving inferomedial left caudate nucleus, small area of acute infarct in the proximal right ACA territory and several subcentimeter foci of acute ischemia in the right distal MCA/ACA watershed--cytotoxic edema with no hemorrhagic transformation   CTA chest without evidence of acute pulmonary embolism--concern for possible air trapping or pneumonitis--noted cardiomegaly with coronary artery calcifications   Palliative medicine was consulted for assisting with goals of care conversations.    Subjective: Chart reviewed including labs, progress notes, imaging from this and previous encounters.  Cortrak has been placed.  Overnight note  from MD and rapid response reviewed. Patient is likely aspirating her secretions.  Labs- WBC decreasing- 13.1. Na elevated but trending down- 146- was 153 yesterday. On exam patient opens eyes intermittently, moaning at times.  Dr. Jens met with family, answered their questions extensively.  I also met with them- daughter Marie Pham and son Marie Pham.  We discussed next steps which would include PEG tube. They inquired about rehab- we discussed the logistics of rehab and the fact that in order for insurance to pay for rehab she would need to be able to follow commands and participate in therapy. Discussed that otherwise- if aggressive ongoing life prolonging care path was chosen then she would likely need either care at home by family and home health or long term care facility. Discussed that typical insurance does  not usually pay for long term care, but some patient's have specific long term care insurance, patients private pay, or Medicaid pays. Marie Pham noted that she was trying to apply for Medicaid and asked for assistance- I let her know I would pass her request on to our social worker.  Family expressed their desire for full scope interventions aimed and prolonging their mother's time. They also emphasized the importance of staff valuing Marie Pham as an individual and having a sense of urgency when providing her care.   Review of Systems  Unable to perform ROS: Intubated     Physical Exam Vitals and nursing note reviewed.  Constitutional:      Appearance: She is ill-appearing.  Cardiovascular:     Rate and Rhythm: Normal rate.  Neurological:     Comments: Non verbal, does not wake to my voice or touch             Vital Signs: BP (!) 175/79 (BP Location: Right Arm)   Pulse 94   Temp 98.2 F (36.8 C) (Oral)   Resp (!) 32   Ht 5' 5 (1.651 m)   Wt 84.9 kg   SpO2 98%   BMI 31.15 kg/m  SpO2: SpO2: 98 % O2 Device: O2 Device: Nasal Cannula O2 Flow Rate: O2 Flow Rate (L/min): 2  L/min  Intake/output summary:  Intake/Output Summary (Last 24 hours) at 03/21/2024 1206 Last data filed at 03/21/2024 1144 Gross per 24 hour  Intake 2893.06 ml  Output --  Net 2893.06 ml   LBM:   Baseline Weight: Weight: 83.5 kg Most recent weight: Weight: 84.9 kg       Palliative Assessment/Data: PPS: 10% (with cortrak in place)      Patient Active Problem List   Diagnosis Date Noted   Severe aortic stenosis 03/19/2024   Pneumonitis 03/19/2024   AKI (acute kidney injury) 03/18/2024   Hypernatremia 03/18/2024   Abnormal EKG 03/18/2024   SIRS (systemic inflammatory response syndrome) (HCC) 03/18/2024   Acute CVA (cerebrovascular accident) (HCC) 03/17/2024   BP (high blood pressure) 10/02/2014   Asthma, chronic 10/02/2014   Essential hypertension 10/02/2014   Obesity 10/02/2014   Sleep apnea 10/02/2014    Palliative Care Assessment & Plan    Assessment/Recommendations/Plan  Severe stroke- NIH 36 at 1600 yesterday and 35 at 0015, 35 at 0549- no difference in night and day measurements per family request Family continues to request current interventions- limit  set at DNR Full scope - will likely want PEG   Code Status:   Code Status: Do not attempt resuscitation (DNR) PRE-ARREST INTERVENTIONS DESIRED   Prognosis:  Unable to determine  Discharge Planning: To Be Determined  Care plan was discussed with patient's son, daughter, and attending MD.   Thank you for allowing the Palliative Medicine Team to assist in the care of this patient.  Total time: 65 minutes Prolonged billing:  Time includes:   Preparing to see the patient (e.g., review of tests) Obtaining and/or reviewing separately obtained history Performing a medically necessary appropriate examination and/or evaluation Counseling and educating the patient/family/caregiver Ordering medications, tests, or procedures Referring and communicating with other health care professionals (when not reported  separately) Documenting clinical information in the electronic or other health record Independently interpreting results (not reported separately) and communicating results to the patient/family/caregiver Care coordination (not reported separately) Clinical documentation  Cassondra Stain, AGNP-C Palliative Medicine   Please contact Palliative Medicine Team phone at [phone] for questions and concerns.

## 2024-03-21 NOTE — Progress Notes (Signed)
 SIGNIFICANT EVENT NOTE  Includes rapid response, CODE BLUE, acute worsening of clinical condition, need for bedside evaluation/urgent imaging, labs, consults, therapeutics  Event: Overhead Rapid Response  NAME: Marie Pham MRN: [id number] DOB : July 25, 1945    Reason for event   pt is a MEWS with RR in the 30's. She was a red MEWS yesterday morning and all day yesterday, but appear worse this evening. She is hard to arouse with sternal rub not responding to any commands. I have been suctioning pt constantly through this shift with thick secretions. She is May intubate, use advanced airway interventions and cardioversion/ACLS medications if appropriate or indicated. May transfer to ICU.      Past medical history context -Patient admitted for acute stroke, aphasic, dysphagia complicated by aspiration pneumonia s/p feeding tube -DNR but not DNI -Palliative care consult ongoing  Sequence of events -Patient had an episode of shortness of breath and unresponsiveness - Was tachypneic but vitals remain stable   Initial patient rapid assessment -Upon my arrival, rapid response team at bedside along with bedside nurse and patient's daughter. -Patient lethargic but will spontaneously open eyes, very tachypneic -Daughter at bedside states that her mother looked exactly the same throughout the day on 03/20/2024 and she does not appreciate a change  Date/Time Temp Pulse Resp BP SpO2 O2 Device O2   03/21/24 04:25:35 98.1 F (36.7 C) 88 34 Abnormal  150/78 Abnormal  100 % -- --  03/21/24 02:37:42 99.8 F (37.7 C) 89 32 Abnormal  155/74 Abnormal  100 % Nasal Cannula 2 L/min  03/20/24 22:41:45 98 F (36.7 C) 93 28 Abnormal  126/111 Abnormal  99 % Nasal Cannula 2 L/min  03/20/24 1849 97.7 F (36.5 C) 96 32 Abnormal  135/75 -- -- --    Physical Exam Constitutional:      Comments: Head of bed elevated near 90 degrees Patient very tachypneic Lethargic, spontaneously opens and closes eyes   Cardiovascular:     Rate and Rhythm: Normal rate and regular rhythm.  Pulmonary:     Effort: Tachypnea present.     Comments: Coarse breath sounds     Initial diagnostic considerations/ plan/interventions   Diagnostic Workup ABG    Component Value Date/Time   PHART 7.4 03/21/2024 0458   PCO2ART 49 (H) 03/21/2024 0458   PO2ART 106 03/21/2024 0458   HCO3 30.4 (H) 03/21/2024 0458   O2SAT 99.4 03/21/2024 0458     Response to interventions   Final assessment     Assessment:  Recurrent aspiration in the setting of dysphagia secondary to acute stroke Aspiration pneumonia, subsequent encounter  Plan: Patient with very poor prognosis Discussion with daughter at bedside with son on speaker phone about patient's prognostic outlook-they would like patient intubated if she deteriorates but she remains DNR Patient is currently with stable vitals except for increased respiratory rate and ABG is reassuring High risk of clinical deterioration If worsening will transfer to stepdown for closer monitoring with ICU consult if unable to protect airway or worsening respiratory status       CRITICAL CARE Performed by: Delayne LULLA Solian   Total critical care time: 35 minutes  Critical care time was exclusive of separately billable procedures and treating other patients.  Critical care was necessary to treat or prevent imminent or life-threatening deterioration.  Critical care was time spent personally by me on the following activities: development of treatment plan with patient and/or surrogate as well as nursing, discussions with consultants,  evaluation of patient's response to treatment, examination of patient, obtaining history from patient or surrogate, ordering and performing treatments and interventions, ordering and review of laboratory studies, ordering and review of radiographic studies, pulse oximetry and re-evaluation of patient's condition.

## 2024-03-21 NOTE — Plan of Care (Signed)

## 2024-03-21 NOTE — Progress Notes (Signed)
 PT Cancellation Note  Patient Details Name: Marie Pham MRN: [id number] DOB: Dec 20, 1945   Cancelled Treatment:     RR called this am due to Resp. Rate in 30's. MEWS remains at 4, currently not medically stable to tolerate skilled PT session on this date. Will continue to monitor and provide PT services per POC.   [name] 03/21/2024, 10:04 AM

## 2024-03-21 NOTE — Care Management Important Message (Signed)
 Important Message  Patient Details  Name: Marie Pham MRN: [id number] Date of Birth: 1945/12/30   Important Message Given:  Yes - Medicare IM     Tamikia Chowning W, CMA 03/21/2024, 3:06 PM

## 2024-03-21 NOTE — Significant Event (Signed)
 Rapid Response Event Note   Reason for Call :  AMS and RR   Initial Focused Assessment:  Patient with eyes opened periodically tracking around room. BP 150/78 RR 34 HR 88 O2 100%. Bed side nurses concern is patients increased respiratory rate. Bed side nurse and family member expressed that the patient's mental status is the same it has been for the last 24hrs. Dr Cleatus at bedside. ABG ordered. If ABG stable patient can continue with current plan on 1C If ABG worsening consider transfer to ICU.   - 0515 ABG stable Dr Cleatus aware. [id number] Per primary nurse no worsening in patients Respiratory  status. Aspiration precautions and frequent suctioning.   Interventions:  -ABG   MD Notified:  DR Cleatus Call Time: 0423 Arrival Upfz:9573 End Upfz:9549  Lesley LOISE Shams, RN

## 2024-03-21 NOTE — Progress Notes (Signed)
 OT Cancellation Note  Patient Details Name: Marie Pham MRN: [id number] DOB: 1945-06-23   Cancelled Treatment:    Reason Eval/Treat Not Completed: Patient not medically ready;Other (comment) (rapid response called this morning, RR remains above 30, RN instructed therapy hold via secure chat)  Maryelizabeth CHRISTELLA Clause 03/21/2024, 10:41 AM

## 2024-03-22 ENCOUNTER — Inpatient Hospital Stay

## 2024-03-22 DIAGNOSIS — R6510 Systemic inflammatory response syndrome (SIRS) of non-infectious origin without acute organ dysfunction: Secondary | ICD-10-CM

## 2024-03-22 DIAGNOSIS — N179 Acute kidney failure, unspecified: Secondary | ICD-10-CM

## 2024-03-22 DIAGNOSIS — R4182 Altered mental status, unspecified: Secondary | ICD-10-CM

## 2024-03-22 DIAGNOSIS — E870 Hyperosmolality and hypernatremia: Secondary | ICD-10-CM | POA: Diagnosis not present

## 2024-03-22 DIAGNOSIS — Z515 Encounter for palliative care: Secondary | ICD-10-CM | POA: Diagnosis not present

## 2024-03-22 DIAGNOSIS — I10 Essential (primary) hypertension: Secondary | ICD-10-CM

## 2024-03-22 DIAGNOSIS — I350 Nonrheumatic aortic (valve) stenosis: Secondary | ICD-10-CM

## 2024-03-22 DIAGNOSIS — R569 Unspecified convulsions: Secondary | ICD-10-CM | POA: Diagnosis not present

## 2024-03-22 DIAGNOSIS — J984 Other disorders of lung: Secondary | ICD-10-CM

## 2024-03-22 DIAGNOSIS — I639 Cerebral infarction, unspecified: Secondary | ICD-10-CM | POA: Diagnosis not present

## 2024-03-22 LAB — RENAL FUNCTION PANEL
Albumin: 3.3 g/dL — ABNORMAL LOW (ref 3.5–5.0)
Anion gap: 10 (ref 5–15)
BUN: 17 mg/dL (ref 8–23)
CO2: 26 mmol/L (ref 22–32)
Calcium: 9 mg/dL (ref 8.9–10.3)
Chloride: 111 mmol/L (ref 98–111)
Creatinine, Ser: 0.65 mg/dL (ref 0.44–1.00)
GFR, Estimated: >60 mL/min (ref >60)
Glucose, Bld: 150 mg/dL — ABNORMAL HIGH (ref 70–99)
Phosphorus: 3.6 mg/dL (ref 2.5–4.6)
Potassium: 4.5 mmol/L (ref 3.5–5.1)
Sodium: 147 mmol/L — ABNORMAL HIGH (ref 135–145)

## 2024-03-22 LAB — GLUCOSE, CAPILLARY
Glucose-Capillary: 147 mg/dL — ABNORMAL HIGH (ref 70–99)
Glucose-Capillary: 154 mg/dL — ABNORMAL HIGH (ref 70–99)
Glucose-Capillary: 155 mg/dL — ABNORMAL HIGH (ref 70–99)
Glucose-Capillary: 163 mg/dL — ABNORMAL HIGH (ref 70–99)
Glucose-Capillary: 180 mg/dL — ABNORMAL HIGH (ref 70–99)

## 2024-03-22 LAB — CBC
HCT: 39.5 % (ref 36.0–46.0)
Hemoglobin: 12.5 g/dL (ref 12.0–15.0)
MCH: 28.3 pg (ref 26.0–34.0)
MCHC: 31.6 g/dL (ref 30.0–36.0)
MCV: 89.6 fL (ref 80.0–100.0)
Platelets: 238 K/uL (ref 150–400)
RBC: 4.41 MIL/uL (ref 3.87–5.11)
RDW: 13.8 % (ref 11.5–15.5)
WBC: 9.9 K/uL (ref 4.0–10.5)
nRBC: 0 % (ref 0.0–0.2)

## 2024-03-22 LAB — MAGNESIUM: Magnesium: 2.5 mg/dL — ABNORMAL HIGH (ref 1.7–2.4)

## 2024-03-22 LAB — BLOOD GAS, ARTERIAL
Acid-Base Excess: 6 mmol/L — ABNORMAL HIGH (ref 0.0–2.0)
Bicarbonate: 33.3 mmol/L — ABNORMAL HIGH (ref 20.0–28.0)
O2 Content: 2 L/min
O2 Saturation: 98.9 %
Patient temperature: 37
pCO2 arterial: 59 mmHg — ABNORMAL HIGH (ref 32–48)
pH, Arterial: 7.36 (ref 7.35–7.45)
pO2, Arterial: 96 mmHg (ref 83–108)

## 2024-03-22 MED ORDER — POLYETHYLENE GLYCOL 3350 17 G PO PACK
17.0000 g | PACK | Freq: Every day | ORAL | Status: DC
  Administered 2024-03-22 – 2024-03-23 (×2): 17 g
  Filled 2024-03-22 (×2): qty 1

## 2024-03-22 MED ORDER — FREE WATER
200.0000 mL | Freq: Four times a day (QID) | Status: DC
  Administered 2024-03-22 – 2024-03-25 (×10): 200 mL

## 2024-03-22 NOTE — TOC Progression Note (Signed)
 Transition of Care [facility]) - Progression Note    Patient Details  Name: Marie Pham MRN: [id number] Date of Birth: 07/19/1945  [facility] CM/SW Contact  Dalia GORMAN Fuse, RN Phone Number: 03/22/2024, 11:04 AM  Clinical Narrative:    Palliative Care discussing PEG, external nutrition provided by NG. ST not following because patient is unable to participate at this time. Patient is not medically ready for discharge and family is not ready to discuss potential discharge.  TOC will continue to follow.   Expected Discharge Plan: Skilled Nursing Facility Barriers to Discharge: Continued Medical Work up               Expected Discharge Plan and Services     Post Acute Care Choice: Skilled Nursing Facility Living arrangements for the past 2 months: Single Family Home                                       Social Drivers of Health (SDOH) Interventions SDOH Screenings   Food Insecurity: Patient Unable To Answer (03/18/2024)  Housing: Patient Unable To Answer (03/18/2024)  Transportation Needs: Patient Unable To Answer (03/18/2024)  Utilities: Patient Unable To Answer (03/18/2024)  Financial Resource Strain: High Risk (09/01/2023)   Received from [facility]  Social Connections: Patient Unable To Answer (03/18/2024)  Tobacco Use: Medium Risk (03/17/2024)    Readmission Risk Interventions     No data to display

## 2024-03-22 NOTE — Progress Notes (Signed)
 Occupational Therapy Treatment Patient Details Name: Marie Pham MRN: [id number] DOB: 06/30/1945 Today's Date: 03/22/2024   History of present illness Pt is a 78 y/o female presenting with aphasia and R sided weakness with last known well over 24 hours prior. 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 Chest Xray showed atelectasis in L and R lungs. PMH: HTN, arthritis, chronic back pain, aortic stenosis.   OT comments  Pt with no progress towards goals today d/t ongoing lethargy. Attempted various strategies in attempts to gain a meaningful response though pt with eyes closed and no command following noted today. PROM of BUE remains WFL. Encouraged son at bedside to provide PROM as comfortable to prevent contractures. Also encouraged talking to pt, playing preferred music, etc. As pt requiring Total A for bed mobility/ADLs,pt may benefit from air mattress for additional pressure relief to prevent skin breakdown. Pending acute progress with rehab, recommend continued inpatient follow up therapy, <3 hours/day upon DC.      If plan is discharge home, recommend the following:  Two people to help with walking and/or transfers;Two people to help with bathing/dressing/bathroom;Direct supervision/assist for medications management;Assistance with cooking/housework;Direct supervision/assist for financial management;Assist for transportation;Help with stairs or ramp for entrance;Supervision due to cognitive status   Equipment Recommendations  Hospital bed;Hoyer lift    Recommendations for Other Services      Precautions / Restrictions Precautions Precautions: Other (comment) Precaution/Restrictions Comments: NG Restrictions Weight Bearing Restrictions Per Provider Order: No       Mobility Bed Mobility Overal bed mobility: Needs Assistance Bed Mobility: Rolling Rolling: Total assist, +2 for physical assistance, +2 for  safety/equipment         General bed mobility comments: to roll to R side to alternate pillow to L side of bottom for pressure relief. Pt moaning when rolled to side, coughing noted as well    Transfers                         Balance                                           ADL either performed or assessed with clinical judgement   ADL Overall ADL's : Needs assistance/impaired     Grooming: Total assistance;Oral care;Bed level Grooming Details (indicate cue type and reason): Total A for oral care via suction toothbrush and mouth wash. Pt tongue reflexively moving toward toothbrush and would automatically open mouth with tactile stim at side of face. Suctioning provided after brushing.                               General ADL Comments: Continues to require Total A for all tasks bed level. Attempted various strategies in attempts to gain pt response: sternal rub, cold washcloth to face, PROM of all extremities, chair position in bed, verbal stimuli    Extremity/Trunk Assessment Upper Extremity Assessment Upper Extremity Assessment: Right hand dominant;RUE deficits/detail;LUE deficits/detail RUE Deficits / Details: PROM WFL, no active movement LUE Deficits / Details: PROM WFL, no active movement   Lower Extremity Assessment Lower Extremity Assessment: Defer to PT evaluation        Vision   Vision Assessment?: Vision impaired- to be further tested in  functional context Additional Comments: to be further assessed pending improvements in eye opening   Perception     Praxis     Communication Communication Communication: Impaired Factors Affecting Communication: Difficulty expressing self   Cognition Arousal: Lethargic Behavior During Therapy: Flat affect Cognition: Difficult to assess Difficult to assess due to: Level of arousal           OT - Cognition Comments: lethargic, flat affect, eyes closed for session, no command  following. pt was noted to moan when rolling and some reflexive movements when assisted for oral care                 Following commands: Impaired Following commands impaired: Follows one step commands inconsistently      Cueing   Cueing Techniques: Verbal cues, Gestural cues, Tactile cues  Exercises      Shoulder Instructions       General Comments Son at bedside    Pertinent Vitals/ Pain       Pain Assessment Pain Assessment: Faces Faces Pain Scale: No hurt  Home Living                                          Prior Functioning/Environment              Frequency  Min 1X/week        Progress Toward Goals  OT Goals(current goals can now be found in the care plan section)  Progress towards OT goals: Not progressing toward goals - comment  Acute Rehab OT Goals Patient Stated Goal: none stated, family hopeful for pt to wake up more OT Goal Formulation: With family Time For Goal Achievement: 04/01/24 Potential to Achieve Goals: Fair ADL Goals Pt Will Perform Grooming: with mod assist;sitting Pt Will Perform Upper Body Dressing: with mod assist;sitting Pt Will Transfer to Toilet: with max assist;bedside commode  Plan      Co-evaluation                 AM-PAC OT 6 Clicks Daily Activity     Outcome Measure   Help from another person eating meals?: Total Help from another person taking care of personal grooming?: Total Help from another person toileting, which includes using toliet, bedpan, or urinal?: Total Help from another person bathing (including washing, rinsing, drying)?: Total Help from another person to put on and taking off regular upper body clothing?: Total Help from another person to put on and taking off regular lower body clothing?: Total 6 Click Score: 6    End of Session    OT Visit Diagnosis: Other symptoms and signs involving the nervous system (R29.898);Other symptoms and signs involving cognitive  function;Muscle weakness (generalized) (M62.81);Repeated falls (R29.6);Hemiplegia and hemiparesis Hemiplegia - Right/Left: Right Hemiplegia - dominant/non-dominant: Dominant   Activity Tolerance Patient limited by lethargy   Patient Left in bed;with call bell/phone within reach;with bed alarm set;with family/visitor present   Nurse Communication Mobility status;Other (comment) (request for air mattress, prevalon boots)        Time: 8859-8796 OT Time Calculation (min): 23 min  Charges: OT General Charges $OT Visit: 1 Visit OT Treatments $Therapeutic Activity: 8-22 mins  Mliss NOVAK, OTR/L Acute Rehab Services Office: [phone]   Mliss Fish 03/22/2024, 12:28 PM

## 2024-03-22 NOTE — Plan of Care (Signed)
  Problem: Clinical Measurements: Goal: Will remain free from infection Outcome: Progressing Goal: Respiratory complications will improve Outcome: Progressing   Problem: Nutrition: Goal: Adequate nutrition will be maintained Outcome: Progressing   Problem: Safety: Goal: Ability to remain free from injury will improve Outcome: Progressing

## 2024-03-22 NOTE — Progress Notes (Signed)
 Daily Progress Note   Marie Pham Name: Marie Pham       Date: 03/22/2024 DOB: December 14, 1945  Age: 78 y.o. MRN#: [id number] Attending Physician: Caleen Qualia, MD Primary Care Physician: Tobie Domino, MD Admit Date: 03/17/2024  Reason for Consultation/Follow-up: Establishing goals of care  Marie Pham Profile/HPI:  77 y.o. female  with past medical history of hypertension, chronic back pain, depression and sleep apnea admitted from home on 03/17/2024 with initial complaints of worsening chronic back pain. Reportedly from family in ED, Marie Pham had poor PO intake and less activity day prior to admission. Last known well time established in ED >24 hours.   CTA obtained in ED revealed hypodensity in the left basal ganglia suspicious for acute/recent infarct recommendations for MRI to follow-up--occluded versus severely stenotic distal right nondominant vertebral artery--severe bilateral P2 PCA stenosis--- moderate left vertebral artery origin stenosis--- mild to moderate left M1 and M2 MCA stenoses MRI obtained and revealing confluent left ACA territory [name] acute infarct involving inferomedial left caudate nucleus, small area of acute infarct in the proximal right ACA territory and several subcentimeter foci of acute ischemia in the right distal MCA/ACA watershed--cytotoxic edema with no hemorrhagic transformation   CTA chest without evidence of acute pulmonary embolism--concern for possible air trapping or pneumonitis--noted cardiomegaly with coronary artery calcifications   Palliative medicine was consulted for assisting with goals of care conversations.    Subjective: Chart reviewed including labs, progress notes, imaging from this and previous encounters.  WBC 9.9 today.  Marie Pham son at bedside.  Reports per his sister that Marie Pham had a good night. He has not seen any changes in her mental status. He and family continue to pray for more recovery. Their goals remain continued supportive care, no interest in comfort measures. He will speak to his sister but they are likely to agree with PEG placement.    Review of Systems  Unable to perform ROS: Mental status change     Physical Exam Vitals and nursing note reviewed.  Constitutional:      Appearance: She is ill-appearing.  Cardiovascular:     Rate and Rhythm: Normal rate.  Neurological:     Comments: Non verbal, does not wake to my voice or touch             Vital Signs: BP (!) 197/75 (  BP Location: Right Arm)   Pulse 87   Temp 98.5 F (36.9 C) (Oral)   Resp 17   Ht 5' 5 (1.651 m)   Wt 86.5 kg   SpO2 100%   BMI 31.73 kg/m  SpO2: SpO2: 100 % O2 Device: O2 Device: Nasal Cannula O2 Flow Rate: O2 Flow Rate (L/min): 2 L/min  Intake/output summary:  Intake/Output Summary (Last 24 hours) at 03/22/2024 1203 Last data filed at 03/22/2024 9343 Gross per 24 hour  Intake 1059.17 ml  Output --  Net 1059.17 ml   LBM:   Baseline Weight: Weight: 83.5 kg Most recent weight: Weight: 86.5 kg       Palliative Assessment/Data: PPS: 10% (with cortrak in place)      Marie Pham Active Problem List   Diagnosis Date Noted   Severe aortic stenosis 03/19/2024   Pneumonitis 03/19/2024   AKI (acute kidney injury) 03/18/2024   Hypernatremia 03/18/2024   Abnormal EKG 03/18/2024   SIRS (systemic inflammatory response syndrome) (HCC) 03/18/2024   Acute CVA (cerebrovascular accident) (HCC) 03/17/2024   BP (high blood pressure) 10/02/2014   Asthma, chronic 10/02/2014   Essential hypertension 10/02/2014   Obesity 10/02/2014   Sleep apnea 10/02/2014    Palliative Care Assessment & Plan    Assessment/Recommendations/Plan  Family continues to request current interventions- limit  set at DNR Full scope - will likely want  PEG   Code Status:   Code Status: Do not attempt resuscitation (DNR) PRE-ARREST INTERVENTIONS DESIRED   Prognosis:  Unable to determine  Discharge Planning: To Be Determined  Care plan was discussed with Marie Pham's son, daughter, and attending MD.   Thank you for allowing the Palliative Medicine Team to assist in the care of this Marie Pham.  Total time: 35 minutes Prolonged billing:  Time includes:   Preparing to see the Marie Pham (e.g., review of tests) Obtaining and/or reviewing separately obtained history Performing a medically necessary appropriate examination and/or evaluation Counseling and educating the Marie Pham/family/caregiver Ordering medications, tests, or procedures Referring and communicating with other health care professionals (when not reported separately) Documenting clinical information in the electronic or other health record Independently interpreting results (not reported separately) and communicating results to the Marie Pham/family/caregiver Care coordination (not reported separately) Clinical documentation  Cassondra Stain, AGNP-C Palliative Medicine   Please contact Palliative Medicine Team phone at [phone] for questions and concerns.

## 2024-03-22 NOTE — Progress Notes (Signed)
 Nutrition Follow-up  DOCUMENTATION CODES:   Obesity unspecified  INTERVENTION:   -TF via NGT:    Osmolite 1.5 @ 50 ml/hr    60 ml Prosource TF20 daily   200 ml free water  flush every 6 hours per MD   Tube feeding regimen provides 1880 kcal (100% of needs), 95 grams of protein, and 914 ml of H2O. Total free water : 1714 ml daily    -Continue MVI with minerals daily via tube -Continue 100 mg thiamine  daily x 7 days via tube -Monitor Mg, K, and Phos and replete as needed secondary to high refeeding risk  -Per palliative care notes, plan for full scope care; family is considering PEG placement -Case discussed with RN and MD regarding lack of bowel movement daily; per MD can start daily miralax - order placed  NUTRITION DIAGNOSIS:   Inadequate oral intake related to inability to eat, dysphagia as evidenced by NPO status.  Ongoing  GOAL:   Patient will meet greater than or equal to 90% of their needs  Met with TF  MONITOR:   Diet advancement, TF tolerance  REASON FOR ASSESSMENT:   Consult Enteral/tube feeding initiation and management  ASSESSMENT:   78 y.o. female with medical history significant for Hypertension, arthritis, chronic back pain, ambulance at baseline with a walker being admitted for an acute CVA.  11/22- NPO with ice chips 11/23- bedside NGT placement attempted by RN, unsuccessful; per SLP unable to assess due to lethargy 11/24- NGT placed via fluoroscopy (tip of tube in lumen per PA note); TF initiated 11/24- s/p BSE- NPO 11/25- rapid response called secondary to lethargy and tachypenia, SLP signed off  Reviewed I/O's: +1.3 L x 24 hours and +5.9 L since admission  Patient receiving personal care at time of visit.   Patient remains NPO and receiving nutrition via NGT for sole source nutrition: Osmolite 1.5 infusing at goal rate of 50 ml/hr. Reached out to RN discuss patient's progress.   Noted no documented BM this admission; RD reached out to RN  and MD to discuss bowel regimen.   No weight loss noted since admission.   Palliative care following for goals of care discussions. Per palliative care, patient family desiring full scope and and would likely want PEG.   Medications reviewed and include plavix , lovenox , MVI, and thiamine .   Labs reviewed: Na: 147, Mg: 2.5, K and Phos WDL. CBGS: 130-180 (inpatient orders for glycemic control are none).    Diet Order:   Diet Order             Diet NPO time specified  Diet effective now                   EDUCATION NEEDS:   Education needs have been addressed  Skin:  Skin Assessment: Reviewed RN Assessment  Last BM:  Unknown  Height:   Ht Readings from Last 1 Encounters:  03/17/24 5' 5 (1.651 m)    Weight:   Wt Readings from Last 1 Encounters:  03/22/24 86.5 kg    Ideal Body Weight:  56.8 kg  BMI:  Body mass index is 31.73 kg/m.  Estimated Nutritional Needs:   Kcal:  1700-1900  Protein:  90-105 grams  Fluid:  1.7-1.9 L    Margery ORN, RD, LDN, CDCES Registered Dietitian III Certified Diabetes Care and Education Specialist If unable to reach this RD, please use RD Inpatient group chat on secure chat between hours of 8am-4 pm daily

## 2024-03-22 NOTE — Progress Notes (Signed)
 Physical Therapy Treatment Patient Details Name: Marie Pham MRN: [id number] DOB: February 10, 1946 Today's Date: 03/22/2024   History of Present Illness Pt is a 78 y/o female presenting with aphasia and R sided weakness with last known well over 24 hours prior. 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 Chest Xray showed atelectasis in L and R lungs. PMH: HTN, arthritis, chronic back pain, aortic stenosis.    PT Comments  Pt received in bed, son at bedside for co-tx with OT due to pt's unresponsiveness and +2 assist needs for all care. This date, pt was unable to be stimulated to open eyes or respond to either therapist via strong sternal rub PROM, repositioning, and various other manual techniques. No grimacing noted during B LE PROM. Pt boosted up in bed and positioned towards Right side, pillows for support. Will continue to monitor and treat as appropriate. Concerns shared with multidisciplinary team regarding ability to participate in skilled PT session. ? Benefit of obtaining an EEG.   If plan is discharge home, recommend the following: A lot of help with walking and/or transfers;A lot of help with bathing/dressing/bathroom;Assist for transportation;Direct supervision/assist for financial management;Assistance with feeding;Assistance with cooking/housework   Can travel by private vehicle     No  Equipment Recommendations  Other (comment) (STR vs LTC)    Recommendations for Other Services       Precautions / Restrictions Precautions Precautions: Fall;Other (comment) (NGT) Precaution/Restrictions Comments: NGT Restrictions Weight Bearing Restrictions Per Provider Order: No     Mobility  Bed Mobility Overal bed mobility: Needs Assistance Bed Mobility: Rolling Rolling: Total assist, +2 for physical assistance, +2 for safety/equipment         General bed mobility comments:  (Complete total assist for all bed mobility and  repositioning. Pt does not wake to movement)    Transfers                        Ambulation/Gait                   Stairs             Wheelchair Mobility     Tilt Bed    Modified Rankin (Stroke Patients Only)       Balance Overall balance assessment:  (NT pt unresponsive)                                          Communication Communication Communication: Impaired Factors Affecting Communication: Other (comment) (Non verbal at this time)  Cognition Arousal: Lethargic Behavior During Therapy: Flat affect                           PT - Cognition Comments:  (Unresponsive to strong sternal rub) Following commands: Impaired Following commands impaired:  (Does not follow commands)    Cueing Cueing Techniques: Verbal cues, Gestural cues, Tactile cues  Exercises      General Comments General comments (skin integrity, edema, etc.):  (NGT intact and help for repositioning. Pt unreactive to movement, pinches, sternal rub)      Pertinent Vitals/Pain Pain Assessment Pain Assessment: PAINAD Faces Pain Scale: No hurt Breathing: normal Negative Vocalization: none Facial Expression: smiling or inexpressive Body Language: relaxed Consolability: no need to console PAINAD  Score: 0 Pain Intervention(s): Limited activity within patient's tolerance, Repositioned    Home Living                          Prior Function            PT Goals (current goals can now be found in the care plan section) Progress towards PT goals: Not progressing toward goals - comment    Frequency    Min 3X/week      PT Plan      Co-evaluation              AM-PAC PT 6 Clicks Mobility   Outcome Measure  Help needed turning from your back to your side while in a flat bed without using bedrails?: Total Help needed moving from lying on your back to sitting on the side of a flat bed without using bedrails?: Total Help  needed moving to and from a bed to a chair (including a wheelchair)?: Total Help needed standing up from a chair using your arms (e.g., wheelchair or bedside chair)?: Total Help needed to walk in hospital room?: Total Help needed climbing 3-5 steps with a railing? : Total 6 Click Score: 6    End of Session Equipment Utilized During Treatment: Oxygen Activity Tolerance: Treatment limited secondary to medical complications (Comment) Patient left: in bed;with call bell/phone within reach;with bed alarm set;with family/visitor present Nurse Communication: Other (comment) (Concern for appropriateness for PT) PT Visit Diagnosis: Muscle weakness (generalized) (M62.81);Hemiplegia and hemiparesis;Adult, failure to thrive (R62.7);History of falling (Z91.81) Hemiplegia - Right/Left: Right Hemiplegia - dominant/non-dominant: Dominant Hemiplegia - caused by: Cerebral infarction     Time: 1136-1204 PT Time Calculation (min) (ACUTE ONLY): 28 min  Charges:    $Therapeutic Activity: 8-22 mins PT General Charges $$ ACUTE PT VISIT: 1 Visit                    Darice Bohr, PTA  [name] 03/22/2024, 4:39 PM

## 2024-03-22 NOTE — Plan of Care (Signed)

## 2024-03-22 NOTE — Progress Notes (Signed)
 Progress Note    Marie Pham  FMW:[id number] DOB: [birth year]  DOA: 03/17/2024 PCP: Tobie Domino, MD      Brief Narrative:    Medical records reviewed and are as summarized below:  Marie Pham is a 78 y.o. female with medical history significant for hypertension, chronic back pain, arthritis, sleep apnea, depression, asthma/bronchitis, ambulates with a walker at baseline who presented to the hospital because of poor oral intake and speech difficulties.  Family had tried to communicate with her and she appeared to understand what her son was saying but she could not respond.  Onset or duration of speech difficulty is not clear.  In the ED, apparently patient indicated that her back hurts.  Vitals in the ED: Temperature 98.7 F, respiratory rate 18, pulse 115, BP 120/99 oxygen saturation 95% on room air.  Troponins negative, mildly elevated D-dimer 0.92. EKG shows sinus tachycardia, right bundle branch block, prolonged QTc at 494  CT angiogram of head and neck showed acute left basal ganglia infarct.  Chest x-ray 1. Linear atelectasis in the left base and right mid lung, which may contribute to shortness of breath. 2. No acute cardiopulmonary process identified.  She was admitted to the hospital for acute stroke.  11/26: Patient very somnolent and difficult to arouse, EEG was also ordered.  Family would like to continue full scope of care, might need a PEG tube placement.  Currently being fed with corTrack.   Assessment/Plan:   Principal Problem:   Acute CVA (cerebrovascular accident) (HCC) Active Problems:   AKI (acute kidney injury)   Hypernatremia   SIRS (systemic inflammatory response syndrome) (HCC)   Abnormal EKG   Essential hypertension   Severe aortic stenosis   Pneumonitis   Body mass index is 31.73 kg/m.  (Class I obesity)  Acute stroke with lethargy, right facial droop, right hemiparesis and aphasia: Persistent encephalopathy 2D echo showed EF  estimated at 60 to 65%, grade 3 diastolic dysfunction, severe aortic valve stenosis. Continue low-dose aspirin , Plavix  and Lipitor  via NG tube PT and OT recommended discharge to SNF. She is still not alert enough for swallow evaluation. S/p NG tube placement by IR on 03/20/2024 Continue enteral nutrition via NG tube current ube. EEG and ABG was ordered  SIRS, tachycardia, tachypnea, leukocytosis, pneumonitis:  Leukocytosis resolved Repeat chest x-ray on 03/21/2024 because rapid response was called in the early hours of the morning for tachypnea.  Chest x-ray showed low lung volumes with bronchovascular crowding but there was no focal consolidation.  CTA chest on11/22/2025 did not show any evidence of pulmonary embolism but there is evidence of bilateral pneumonitis.  This is probably due to aspiration.   -Continue IV ceftriaxone  and azithromycin    Severe aortic stenosis, grade 3 diastolic dysfunction: Will need outpatient follow-up with cardiologist.   Hypernatremia, dehydration: Improved.  Sodium down from 153-146.>147   - Scheduled for free water  AKI ruled out.  Hypertension: Blood pressure was mildly elevated earlier.  Continue carvedilol .   Chronic back pain/lumbar radiculitis - Pain management   Plan of care was discussed with [name] (son), they want to keep her full scope of care to see the trajectory. They think that care was delayed in the ED and patient could have been a candidate for tPA.  They said they have read a lot about strokes. I explained that it was documented in the chart that patient was last known normal over 24 hours, and that given tPA beyond the recommended timeframe could  potentially have serious complications but with little benefit.   Diet Order             Diet NPO time specified  Diet effective now                  Consultants: Neurologist  Procedures: NG tube placement by IR on 03/20/2024  Medications:    aspirin   81 mg Per NG tube  Daily   atorvastatin   80 mg Per NG tube Daily   carvedilol   12.5 mg Per NG tube BID WC   clopidogrel   75 mg Per NG tube Daily   enoxaparin  (LOVENOX ) injection  40 mg Subcutaneous Q24H   feeding supplement (PROSource TF20)  60 mL Per Tube Daily   free water   200 mL Per Tube Q6H   multivitamin with minerals  1 tablet Per Tube Daily   polyethylene glycol  17 g Per Tube Daily   thiamine   100 mg Per Tube Daily   Continuous Infusions:  feeding supplement (OSMOLITE 1.5 CAL) 1,000 mL (03/22/24 0647)     Anti-infectives (From admission, onward)    Start     Dose/Rate Route Frequency Ordered Stop   03/19/24 1630  azithromycin  (ZITHROMAX ) 500 mg in sodium chloride  0.9 % 250 mL IVPB  Status:  Discontinued        500 mg 250 mL/hr over 60 Minutes Intravenous Every 24 hours 03/19/24 1533 03/22/24 1542   03/18/24 1045  cefTRIAXone  (ROCEPHIN ) 1 g in sodium chloride  0.9 % 100 mL IVPB  Status:  Discontinued        1 g 200 mL/hr over 30 Minutes Intravenous Every 24 hours 03/18/24 0945 03/22/24 1542       Family Communication/Anticipated D/C date and plan/Code Status   DVT prophylaxis: enoxaparin  (LOVENOX ) injection 40 mg Start: 03/18/24 1038     Code Status: Do not attempt resuscitation (DNR) PRE-ARREST INTERVENTIONS DESIRED  Family Communication: Plan discussed with [name] (son)   Disposition Plan: Plan to discharge to SNF   Status is: Inpatient Remains inpatient appropriate because: Acute stroke   Subjective:   Patient remained very somnolent, difficult to arouse.  Son at bedside.  Objective:    Vitals:   03/22/24 0428 03/22/24 0500 03/22/24 0800 03/22/24 1200  BP: (!) 146/63  (!) 197/75 (!) 130/59  Pulse: 85  87 81  Resp: 18  17 18   Temp: 99 F (37.2 C)  98.5 F (36.9 C) 97.8 F (36.6 C)  TempSrc: Axillary  Oral Axillary  SpO2: 100%  100% 98%  Weight:  86.5 kg    Height:       No data found.   Intake/Output Summary (Last 24 hours) at 03/22/2024 1756 Last data filed  at 03/22/2024 1215 Gross per 24 hour  Intake 1059.17 ml  Output --  Net 1059.17 ml   Filed Weights   03/17/24 1509 03/21/24 0500 03/22/24 0500  Weight: 83.5 kg 84.9 kg 86.5 kg    Exam:  General.  Pretty much unresponsive, frail elderly lady, in no acute distress. Pulmonary.  Lungs clear bilaterally, normal respiratory effort. CV.  Regular rate and rhythm, no JVD, rub or murmur. Abdomen.  Soft, nontender, nondistended, BS positive. CNS.  Remained encephalopathic Extremities.  No edema,  pulses intact and symmetrical.   Data Reviewed:   I have personally reviewed following labs and imaging studies:  Labs: Labs show the following:   Basic Metabolic Panel: Recent Labs  Lab 03/18/24 1139 03/19/24 0404 03/20/24 0600 03/21/24 0452 03/22/24  0426  NA 149* 150* 153* 146* 147*  K 4.4 4.0 3.9 4.0 4.5  CL 115* 113* 117* 110 111  CO2 20* 25 25 28 26   GLUCOSE 108* 100* 96 153* 150*  BUN 20 18 18 19 17   CREATININE 0.89 0.92 0.83 0.88 0.65  CALCIUM  8.9 9.2 9.2 9.0 9.0  MG  --  2.5* 2.5* 2.4 2.5*  PHOS  --  4.0 3.5 3.4 3.6   GFR Estimated Creatinine Clearance: 66 mL/min (by C-G formula based on SCr of 0.65 mg/dL). Liver Function Tests: Recent Labs  Lab 03/17/24 1624 03/19/24 0404 03/20/24 0600 03/22/24 0426  AST 22 49*  --   --   ALT 12 29  --   --   ALKPHOS 117 108  --   --   BILITOT 0.7 0.7  --   --   PROT 8.3* 7.3  --   --   ALBUMIN 4.5 4.0 3.8 3.3*   Recent Labs  Lab 03/17/24 1624  LIPASE 42   No results for input(s): AMMONIA in the last 168 hours. Coagulation profile No results for input(s): INR, PROTIME in the last 168 hours.  CBC: Recent Labs  Lab 03/17/24 1624 03/18/24 1139 03/19/24 0404 03/20/24 0600 03/21/24 0452 03/22/24 0426  WBC 9.4 15.7* 26.5* 17.3* 13.1* 9.9  NEUTROABS 5.8  --   --   --   --   --   HGB 15.4* 14.2 13.6 13.2 12.4 12.5  HCT 47.4* 44.9 42.6 41.5 39.1 39.5  MCV 88.3 91.1 90.8 90.4 90.1 89.6  PLT 363 300 309 275 248  238   Cardiac Enzymes: No results for input(s): CKTOTAL, CKMB, CKMBINDEX, TROPONINI in the last 168 hours. BNP (last 3 results) No results for input(s): PROBNP in the last 8760 hours. CBG: Recent Labs  Lab 03/21/24 2200 03/22/24 0005 03/22/24 0425 03/22/24 0740 03/22/24 1216  GLUCAP 156* 147* 154* 180* 155*   D-Dimer: No results for input(s): DDIMER in the last 72 hours.  Hgb A1c: No results for input(s): HGBA1C in the last 72 hours.  Lipid Profile: No results for input(s): CHOL, HDL, LDLCALC, TRIG, CHOLHDL, LDLDIRECT in the last 72 hours.  Thyroid  function studies: No results for input(s): TSH, T4TOTAL, T3FREE, THYROIDAB in the last 72 hours.  Invalid input(s): FREET3 Anemia work up: No results for input(s): VITAMINB12, FOLATE, FERRITIN, TIBC, IRON, RETICCTPCT in the last 72 hours. Sepsis Labs: Recent Labs  Lab 03/19/24 0404 03/20/24 0600 03/21/24 0452 03/22/24 0426  WBC 26.5* 17.3* 13.1* 9.9    Microbiology No results found for this or any previous visit (from the past 240 hours).  Procedures and diagnostic studies:  DG Chest Port 1 View Result Date: 03/21/2024 CLINICAL DATA:  Pneumonia EXAM: PORTABLE CHEST 1 VIEW COMPARISON:  Chest radiograph dated 03/18/2024 FINDINGS: Lines/tubes: Gastric/enteric tube tip projects over the stomach. Lungs: Low lung volumes with bronchovascular crowding. No focal consolidation. Pleura: No pneumothorax or pleural effusion. Heart/mediastinum: The heart size and mediastinal contours are within normal limits. Bones: No acute osseous abnormality. IMPRESSION: Low lung volumes with bronchovascular crowding. No focal consolidation. Electronically Signed   By: Limin  Xu M.D.   On: 03/21/2024 08:50   Time spent.  50 minutes  This record has been created using Conservation officer, historic buildings. Errors have been sought and corrected,but may not always be located. Such creation errors do not  reflect on the standard of care.    LOS: 4 days   Sakinah Rosamond  [facility]  Pager on [email or web address]. If 7PM-7AM, please contact night-coverage at [email or web address]  03/22/2024, 5:56 PM

## 2024-03-22 NOTE — Progress Notes (Signed)
 Eeg completed.

## 2024-03-23 ENCOUNTER — Inpatient Hospital Stay

## 2024-03-23 DIAGNOSIS — I63523 Cerebral infarction due to unspecified occlusion or stenosis of bilateral anterior cerebral arteries: Secondary | ICD-10-CM | POA: Diagnosis not present

## 2024-03-23 DIAGNOSIS — I639 Cerebral infarction, unspecified: Secondary | ICD-10-CM | POA: Diagnosis not present

## 2024-03-23 DIAGNOSIS — R6510 Systemic inflammatory response syndrome (SIRS) of non-infectious origin without acute organ dysfunction: Secondary | ICD-10-CM | POA: Diagnosis not present

## 2024-03-23 DIAGNOSIS — N179 Acute kidney failure, unspecified: Secondary | ICD-10-CM | POA: Diagnosis not present

## 2024-03-23 DIAGNOSIS — E870 Hyperosmolality and hypernatremia: Secondary | ICD-10-CM | POA: Diagnosis not present

## 2024-03-23 DIAGNOSIS — Z515 Encounter for palliative care: Secondary | ICD-10-CM | POA: Diagnosis not present

## 2024-03-23 LAB — RENAL FUNCTION PANEL
Albumin: 3.1 g/dL — ABNORMAL LOW (ref 3.5–5.0)
Anion gap: 7 (ref 5–15)
BUN: 17 mg/dL (ref 8–23)
CO2: 28 mmol/L (ref 22–32)
Calcium: 8.9 mg/dL (ref 8.9–10.3)
Chloride: 110 mmol/L (ref 98–111)
Creatinine, Ser: 0.65 mg/dL (ref 0.44–1.00)
GFR, Estimated: >60 mL/min (ref >60)
Glucose, Bld: 196 mg/dL — ABNORMAL HIGH (ref 70–99)
Phosphorus: 3.5 mg/dL (ref 2.5–4.6)
Potassium: 4.8 mmol/L (ref 3.5–5.1)
Sodium: 145 mmol/L (ref 135–145)

## 2024-03-23 LAB — BLOOD GAS, ARTERIAL
Acid-Base Excess: 11.2 mmol/L — ABNORMAL HIGH (ref 0.0–2.0)
Bicarbonate: 36.5 mmol/L — ABNORMAL HIGH (ref 20.0–28.0)
O2 Content: 2 L/min
O2 Saturation: 99.2 %
Patient temperature: 37
pCO2 arterial: 49 mmHg — ABNORMAL HIGH (ref 32–48)
pH, Arterial: 7.48 — ABNORMAL HIGH (ref 7.35–7.45)
pO2, Arterial: 96 mmHg (ref 83–108)

## 2024-03-23 LAB — GLUCOSE, CAPILLARY
Glucose-Capillary: 128 mg/dL — ABNORMAL HIGH (ref 70–99)
Glucose-Capillary: 149 mg/dL — ABNORMAL HIGH (ref 70–99)
Glucose-Capillary: 154 mg/dL — ABNORMAL HIGH (ref 70–99)
Glucose-Capillary: 159 mg/dL — ABNORMAL HIGH (ref 70–99)
Glucose-Capillary: 160 mg/dL — ABNORMAL HIGH (ref 70–99)
Glucose-Capillary: 218 mg/dL — ABNORMAL HIGH (ref 70–99)

## 2024-03-23 LAB — TSH: TSH: 2.26 u[IU]/mL (ref 0.350–4.500)

## 2024-03-23 LAB — AMMONIA: Ammonia: 37 umol/L — ABNORMAL HIGH (ref 9–35)

## 2024-03-23 LAB — VITAMIN B12: Vitamin B-12: 165 pg/mL — ABNORMAL LOW (ref 180–914)

## 2024-03-23 MED ORDER — LACTULOSE 10 GM/15ML PO SOLN
30.0000 g | Freq: Two times a day (BID) | ORAL | Status: DC
  Administered 2024-03-23 – 2024-03-24 (×2): 30 g
  Filled 2024-03-23 (×2): qty 60

## 2024-03-23 NOTE — Procedures (Signed)
 History: 78 yo F with CVA and ams, EEG to evaluated for seizures  EEG Duration: 30 minutes  Sedation: none  Patient State: Awake and drowsy  Technique: This EEG was acquired with electrodes placed according to the International 10-20 electrode system (including Fp1, Fp2, F3, F4, C3, C4, P3, P4, O1, O2, T3, T4, T5, T6, A1, A2, Fz, Cz, Pz). The following electrodes were missing or displaced: none.   Background: The background somewhat disorganized, with pronounced irregular theta and delta range activities.  There is a posterior dominant rhythm of 8 to 8.5 Hz that is well-sustained.  There are occasional bifrontally predominant discharges with a shifting lateral predominance with triphasic morphology.  Photic stimulation: Physiologic driving is not performed  EEG Abnormalities: 1) triphasic waves 2) generalized irregular slow activity  Clinical Interpretation: This EEG is consistent with a generalized nonspecific cerebral dysfunction, though triphasic waves are nonspecific and they can be associated with toxic/metabolic etiologies of encephalopathy. There was no seizure or seizure predisposition recorded on this study. Please note that lack of epileptiform activity on EEG does not preclude the possibility of epilepsy.   Aisha Seals, MD [facility]   If 7pm- 7am, please page neurology on call as listed in AMION.

## 2024-03-23 NOTE — Progress Notes (Signed)
 Progress Note    Marie Pham  FMW:[id number] DOB: 1945-09-09  DOA: 03/17/2024 PCP: Tobie Domino, MD      Brief Narrative:    Medical records reviewed and are as summarized below:  Marie Pham is a 78 y.o. female with medical history significant for hypertension, chronic back pain, arthritis, sleep apnea, depression, asthma/bronchitis, ambulates with a walker at baseline who presented to the hospital because of poor oral intake and speech difficulties.  Family had tried to communicate with her and she appeared to understand what her son was saying but she could not respond.  Onset or duration of speech difficulty is not clear.  In the ED, apparently patient indicated that her back hurts.  Vitals in the ED: Temperature 98.7 F, respiratory rate 18, pulse 115, BP 120/99 oxygen saturation 95% on room air.  Troponins negative, mildly elevated D-dimer 0.92. EKG shows sinus tachycardia, right bundle branch block, prolonged QTc at 494  CT angiogram of head and neck showed acute left basal ganglia infarct.  Chest x-ray 1. Linear atelectasis in the left base and right mid lung, which may contribute to shortness of breath. 2. No acute cardiopulmonary process identified.  She was admitted to the hospital for acute stroke.  11/26: Patient very somnolent and difficult to arouse, EEG was also ordered.  Family would like to continue full scope of care, might need a PEG tube placement.  Currently being fed with corTrack.  11/27: No change in mentation, remained minimally responsive to obnoxious stimuli.  Repeat ABG with mild hypercarbia at 49 which does not explain this encephalopathy, ammonia mildly elevated at 37, we will give her a trial of bicarb, also adding lactulose .  Repeating brain MRI and neurology was reconsulted.  1 episode of A-fib was mentioned on her monitor on 11/23, talked with central monitoring system and it was PACs, she never had any documented atrial  fibrillation.   Assessment/Plan:   Principal Problem:   Acute CVA (cerebrovascular accident) (HCC) Active Problems:   AKI (acute kidney injury)   Hypernatremia   SIRS (systemic inflammatory response syndrome) (HCC)   Abnormal EKG   Essential hypertension   Severe aortic stenosis   Pneumonitis   Body mass index is 32.06 kg/m.  (Class I obesity)  Acute stroke with lethargy, right facial droop, right hemiparesis and aphasia: Persistent encephalopathy Patient with bilateral frontal lobe and basal ganglia infarct. 2D echo showed EF estimated at 60 to 65%, grade 3 diastolic dysfunction, severe aortic valve stenosis. Continue low-dose aspirin , Plavix  and Lipitor  via NG tube PT and OT recommended discharge to SNF. She is still not alert enough for swallow evaluation. S/p NG tube placement by IR on 03/20/2024 Continue enteral nutrition via NG tube -likely will need PEG tube. EEG with generalized cortical dysfunction, no epileptiform discharges. ABG with mild hypercarbia and mildly elevated ammonia. - Giving her a trial of bicarb and adding lactulose -low chance of contributing to this persistent encephalopathy. - Repeat brain MRI  SIRS, tachycardia, tachypnea, leukocytosis, pneumonitis:  Leukocytosis resolved Repeat chest x-ray on 03/21/2024 because rapid response was called in the early hours of the morning for tachypnea.  Chest x-ray showed low lung volumes with bronchovascular crowding but there was no focal consolidation.  CTA chest on11/22/2025 did not show any evidence of pulmonary embolism but there is evidence of bilateral pneumonitis.  This is probably due to aspiration.   - Completed a 5-day course of antibiotics with ceftriaxone  and Zithromax   Severe aortic stenosis, grade  3 diastolic dysfunction: Will need outpatient follow-up with cardiologist.  Hypernatremia, dehydration: Improved.  Sodium down from 153-146.>147.145   - Continue with free water  AKI ruled  out.  Hypertension: Blood pressure within goal  Continue carvedilol .  Chronic back pain/lumbar radiculitis - Pain management   Plan of care was discussed with [name] (son), they want to keep her full scope of care to see the trajectory. They think that care was delayed in the ED and patient could have been a candidate for tPA.  They said they have read a lot about strokes. I explained that it was documented in the chart that patient was last known normal over 24 hours, and that given tPA beyond the recommended timeframe could potentially have serious complications but with little benefit.   Diet Order             Diet NPO time specified  Diet effective now                  Consultants: Neurologist  Procedures: NG tube placement by IR on 03/20/2024  Medications:    aspirin   81 mg Per NG tube Daily   atorvastatin   80 mg Per NG tube Daily   carvedilol   12.5 mg Per NG tube BID WC   clopidogrel   75 mg Per NG tube Daily   enoxaparin  (LOVENOX ) injection  40 mg Subcutaneous Q24H   feeding supplement (PROSource TF20)  60 mL Per Tube Daily   free water   200 mL Per Tube Q6H   lactulose   30 g Per Tube BID   multivitamin with minerals  1 tablet Per Tube Daily   polyethylene glycol  17 g Per Tube Daily   thiamine   100 mg Per Tube Daily   Continuous Infusions:  feeding supplement (OSMOLITE 1.5 CAL) 1,000 mL (03/22/24 0647)     Anti-infectives (From admission, onward)    Start     Dose/Rate Route Frequency Ordered Stop   03/19/24 1630  azithromycin  (ZITHROMAX ) 500 mg in sodium chloride  0.9 % 250 mL IVPB  Status:  Discontinued        500 mg 250 mL/hr over 60 Minutes Intravenous Every 24 hours 03/19/24 1533 03/22/24 1542   03/18/24 1045  cefTRIAXone  (ROCEPHIN ) 1 g in sodium chloride  0.9 % 100 mL IVPB  Status:  Discontinued        1 g 200 mL/hr over 30 Minutes Intravenous Every 24 hours 03/18/24 0945 03/22/24 1542       Family Communication/Anticipated D/C date and plan/Code  Status   DVT prophylaxis: enoxaparin  (LOVENOX ) injection 40 mg Start: 03/18/24 1038     Code Status: Do not attempt resuscitation (DNR) PRE-ARREST INTERVENTIONS DESIRED  Family Communication: Plan discussed with [name] (son)   Disposition Plan: Plan to discharge to SNF   Status is: Inpatient Remains inpatient appropriate because: Acute stroke and persistent encephalopathy   Subjective:  Patient remained encephalopathic, minimal response to noxious stimuli.  Objective:    Vitals:   03/23/24 0240 03/23/24 0500 03/23/24 0632 03/23/24 1051  BP: 131/65  135/73 (!) 130/55  Pulse: 93  96 87  Resp: 20  20 (!) 28  Temp:      TempSrc:      SpO2: 100%  99% 98%  Weight:  87.4 kg    Height:       No data found.   Intake/Output Summary (Last 24 hours) at 03/23/2024 1507 Last data filed at 03/23/2024 1200 Gross per 24 hour  Intake 2592.76 ml  Output --  Net 2592.76 ml   Filed Weights   03/21/24 0500 03/22/24 0500 03/23/24 0500  Weight: 84.9 kg 86.5 kg 87.4 kg    Exam: General.  Frail and ill-appearing elderly lady, in no acute distress. Pulmonary.  Lungs clear bilaterally, normal respiratory effort. CV.  Regular rate and rhythm, no JVD, rub or murmur. Abdomen.  Soft, nontender, nondistended, BS positive. CNS.  Minimum response to verbal or noxious stimuli Extremities.  No edema, pulses intact and symmetrical.  Data Reviewed:   I have personally reviewed following labs and imaging studies:  Labs: Labs show the following:   Basic Metabolic Panel: Recent Labs  Lab 03/19/24 0404 03/20/24 0600 03/21/24 0452 03/22/24 0426 03/23/24 0940  NA 150* 153* 146* 147* 145  K 4.0 3.9 4.0 4.5 4.8  CL 113* 117* 110 111 110  CO2 25 25 28 26 28   GLUCOSE 100* 96 153* 150* 196*  BUN 18 18 19 17 17   CREATININE 0.92 0.83 0.88 0.65 0.65  CALCIUM  9.2 9.2 9.0 9.0 8.9  MG 2.5* 2.5* 2.4 2.5*  --   PHOS 4.0 3.5 3.4 3.6 3.5   GFR Estimated Creatinine Clearance: 66.4 mL/min (by C-G  formula based on SCr of 0.65 mg/dL). Liver Function Tests: Recent Labs  Lab 03/17/24 1624 03/19/24 0404 03/20/24 0600 03/22/24 0426 03/23/24 0940  AST 22 49*  --   --   --   ALT 12 29  --   --   --   ALKPHOS 117 108  --   --   --   BILITOT 0.7 0.7  --   --   --   PROT 8.3* 7.3  --   --   --   ALBUMIN 4.5 4.0 3.8 3.3* 3.1*   Recent Labs  Lab 03/17/24 1624  LIPASE 42   Recent Labs  Lab 03/23/24 1028  AMMONIA 37*   Coagulation profile No results for input(s): INR, PROTIME in the last 168 hours.  CBC: Recent Labs  Lab 03/17/24 1624 03/18/24 1139 03/19/24 0404 03/20/24 0600 03/21/24 0452 03/22/24 0426  WBC 9.4 15.7* 26.5* 17.3* 13.1* 9.9  NEUTROABS 5.8  --   --   --   --   --   HGB 15.4* 14.2 13.6 13.2 12.4 12.5  HCT 47.4* 44.9 42.6 41.5 39.1 39.5  MCV 88.3 91.1 90.8 90.4 90.1 89.6  PLT 363 300 309 275 248 238   Cardiac Enzymes: No results for input(s): CKTOTAL, CKMB, CKMBINDEX, TROPONINI in the last 168 hours. BNP (last 3 results) No results for input(s): PROBNP in the last 8760 hours. CBG: Recent Labs  Lab 03/22/24 2017 03/23/24 0023 03/23/24 0429 03/23/24 0740 03/23/24 1208  GLUCAP 163* 154* 160* 128* 159*   D-Dimer: No results for input(s): DDIMER in the last 72 hours.  Hgb A1c: No results for input(s): HGBA1C in the last 72 hours.  Lipid Profile: No results for input(s): CHOL, HDL, LDLCALC, TRIG, CHOLHDL, LDLDIRECT in the last 72 hours.  Thyroid  function studies: Recent Labs    03/23/24 1028  TSH 2.260   Anemia work up: No results for input(s): VITAMINB12, FOLATE, FERRITIN, TIBC, IRON, RETICCTPCT in the last 72 hours. Sepsis Labs: Recent Labs  Lab 03/19/24 0404 03/20/24 0600 03/21/24 0452 03/22/24 0426  WBC 26.5* 17.3* 13.1* 9.9    Microbiology No results found for this or any previous visit (from the past 240 hours).  Procedures and diagnostic studies:  EEG adult Result Date:  03/22/2024 Michaela Aisha SQUIBB, MD  03/23/2024 10:07 AM History: 78 yo F with CVA and ams, EEG to evaluated for seizures EEG Duration: 30 minutes Sedation: none Patient State: Awake and drowsy Technique: This EEG was acquired with electrodes placed according to the International 10-20 electrode system (including Fp1, Fp2, F3, F4, C3, C4, P3, P4, O1, O2, T3, T4, T5, T6, A1, A2, Fz, Cz, Pz). The following electrodes were missing or displaced: none. Background: The background somewhat disorganized, with pronounced irregular theta and delta range activities.  There is a posterior dominant rhythm of 8 to 8.5 Hz that is well-sustained.  There are occasional bifrontally predominant discharges with a shifting lateral predominance with triphasic morphology. Photic stimulation: Physiologic driving is not performed EEG Abnormalities: 1) triphasic waves 2) generalized irregular slow activity Clinical Interpretation: This EEG is consistent with a generalized nonspecific cerebral dysfunction, though triphasic waves are nonspecific and they can be associated with toxic/metabolic etiologies of encephalopathy. There was no seizure or seizure predisposition recorded on this study. Please note that lack of epileptiform activity on EEG does not preclude the possibility of epilepsy. Aisha Seals, MD [facility] If 7pm- 7am, please page neurology on call as listed in AMION.  Time spent.  50 minutes  This record has been created using Conservation officer, historic buildings. Errors have been sought and corrected,but may not always be located. Such creation errors do not reflect on the standard of care.    LOS: 5 days   Amaryllis Dare  [facility]   Pager on [email or web address]. If 7PM-7AM, please contact night-coverage at [email or web address]  03/23/2024, 3:07 PM

## 2024-03-23 NOTE — Plan of Care (Signed)
  Problem: Education: Goal: Knowledge of General Education information will improve Description: Including pain rating scale, medication(s)/side effects and non-pharmacologic comfort measures Outcome: Progressing   Problem: Coping: Goal: Level of anxiety will decrease Outcome: Progressing   Problem: Elimination: Goal: Will not experience complications related to bowel motility Outcome: Progressing   Problem: Pain Managment: Goal: General experience of comfort will improve and/or be controlled Outcome: Progressing   Problem: Safety: Goal: Ability to remain free from injury will improve Outcome: Progressing   Problem: Skin Integrity: Goal: Risk for impaired skin integrity will decrease Outcome: Progressing

## 2024-03-23 NOTE — Plan of Care (Signed)
  Problem: Clinical Measurements: Goal: Will remain free from infection Outcome: Progressing   Problem: Nutrition: Goal: Adequate nutrition will be maintained Outcome: Progressing   Problem: Elimination: Goal: Will not experience complications related to bowel motility Outcome: Progressing

## 2024-03-23 NOTE — Progress Notes (Signed)
 NEUROLOGY CONSULT FOLLOW UP NOTE   Date of service: March 23, 2024 Patient Name: Marie Pham MRN:  [id number] DOB:  November 05, 1945  Interval Hx/subjective   Family reports that when she first came in, she was walking and using her walker, but just could not talk.  Since that time, she has become more encephalopathic.  Currently she is not awake enough to eat  Vitals   Vitals:   03/23/24 0240 03/23/24 0500 03/23/24 0632 03/23/24 1051  BP: 131/65  135/73 (!) 130/55  Pulse: 93  96 87  Resp: 20  20 (!) 28  Temp:      TempSrc:      SpO2: 100%  99% 98%  Weight:  87.4 kg    Height:         Body mass index is 32.06 kg/m.  Physical Exam   General: In bed, NG tube in place  Neurologic Examination    MS: Opens eyes partially to noxious stimulation, does not follow commands, look at examiner, or otherwise interact meaningfully CN: Pupils are reactive, VOR intact, blinks to eyelid stimulation bilaterally Motor: Flexion to noxious stimulation bilaterally Sensory: As above  Medications  Current Facility-Administered Medications:    acetaminophen  (TYLENOL ) tablet 650 mg, 650 mg, Oral, Q4H PRN, 650 mg at 03/23/24 0946 **OR** acetaminophen  (TYLENOL ) 160 MG/5ML solution 650 mg, 650 mg, Per Tube, Q4H PRN **OR** acetaminophen  (TYLENOL ) suppository 650 mg, 650 mg, Rectal, Q4H PRN, Marie Delayne GAILS, MD, 650 mg at 03/18/24 1723   aspirin  chewable tablet 81 mg, 81 mg, Per NG tube, Daily, Marie Durand, MD, 81 mg at 03/23/24 0946   atorvastatin  (LIPITOR ) tablet 80 mg, 80 mg, Per NG tube, Daily, Ayiku, Bernard, MD, 80 mg at 03/23/24 0936   carvedilol  (COREG ) tablet 12.5 mg, 12.5 mg, Per NG tube, BID WC, Marie Durand, MD, 12.5 mg at 03/23/24 9063   clopidogrel  (PLAVIX ) tablet 75 mg, 75 mg, Per NG tube, Daily, Ayiku, Bernard, MD, 75 mg at 03/23/24 0936   enoxaparin  (LOVENOX ) injection 40 mg, 40 mg, Subcutaneous, Q24H, Marie Pham, RPH, 40 mg at 03/23/24 9062   feeding supplement  (OSMOLITE 1.5 CAL) liquid 1,000 mL, 1,000 mL, Per Tube, Continuous, Ayiku, Bernard, MD, Last Rate: 50 mL/hr at 03/22/24 0647, 1,000 mL at 03/22/24 0647   feeding supplement (PROSource TF20) liquid 60 mL, 60 mL, Per Tube, Daily, Marie Durand, MD, 60 mL at 03/23/24 0947   free water  200 mL, 200 mL, Per Tube, Q6H, Amin, Amaryllis, MD, 200 mL at 03/23/24 0631   lactulose  (CHRONULAC ) 10 GM/15ML solution 30 g, 30 g, Per Tube, BID, Amin, Sumayya, MD   multivitamin with minerals tablet 1 tablet, 1 tablet, Per Tube, Daily, Marie Durand, MD, 1 tablet at 03/23/24 0936   polyethylene glycol (MIRALAX  / GLYCOLAX ) packet 17 g, 17 g, Per Tube, Daily, Amin, Sumayya, MD, 17 g at 03/23/24 9063   thiamine  (VITAMIN B1) tablet 100 mg, 100 mg, Per Tube, Daily, Marie Durand, MD, 100 mg at 03/23/24 0936  Labs and Diagnostic Imaging   LDL 168 A1c 6.1  EEG - encephalopathic   Imaging(Personally reviewed): MRI -bilateral frontal infarcts CTA-both the ACAs are fed by the left ACOM with minimal contribution (I do not see any right ACOM) from the right side  Assessment   Marie Pham is a 78 y.o. female with bilateral ACA territory infarcts, the left was much more involved than the right.  There is an irregularity right where the ACAs come off of  the ACOM, and I am wondering if this finally occluded.  Bifrontal strokes can often result in severely diminished motivation and can result in akinetic mutism.  I think repeating her MRI to look for other lesions would also be prudent.  There was documentation of atrial fibrillation and if this is felt to be real on review, could be a source of her stroke.  There is a chance that she could have some gradual improvement over time, though this is not guaranteed.  I discussed this with the daughter and emphasized that future decision should be made based on what the patient would desire.  She expressed understanding, they are planning on supporting her with PEG tube,  etc.  Recommendations  TSH, ammonia, B12 Continue thiamine  Continue aspirin  and Plavix  If primary team feels that the atrial fibrillation documented was real, I would consider anticoagulation a week from newest symptoms and antiplatelet therapy can be stopped from a neurological perspective at that time. Repeat MRI brain  ______________________________________________________________________   Signed, Marie Seals, MD [facility]

## 2024-03-23 NOTE — Progress Notes (Signed)
 Daily Progress Note   Patient Name: Marie Pham       Date: 03/23/2024 DOB: October 13, 1945  Age: 78 y.o. MRN#: [id number] Attending Physician: Caleen Qualia, MD Primary Care Physician: Tobie Domino, MD Admit Date: 03/17/2024  Reason for Consultation/Follow-up: Establishing goals of care  Patient Profile/HPI:  78 y.o. female  with past medical history of hypertension, chronic back pain, depression and sleep apnea admitted from home on 03/17/2024 with initial complaints of worsening chronic back pain. Reportedly from family in ED, Marie Pham had poor PO intake and less activity day prior to admission. Last known well time established in ED >24 hours.   CTA obtained in ED revealed hypodensity in the left basal ganglia suspicious for acute/recent infarct recommendations for MRI to follow-up--occluded versus severely stenotic distal right nondominant vertebral artery--severe bilateral P2 PCA stenosis--- moderate left vertebral artery origin stenosis--- mild to moderate left M1 and M2 MCA stenoses MRI obtained and revealing confluent left ACA territory [name] acute infarct involving inferomedial left caudate nucleus, small area of acute infarct in the proximal right ACA territory and several subcentimeter foci of acute ischemia in the right distal MCA/ACA watershed--cytotoxic edema with no hemorrhagic transformation   CTA chest without evidence of acute pulmonary embolism--concern for possible air trapping or pneumonitis--noted cardiomegaly with coronary artery calcifications   Palliative medicine was consulted for assisting with goals of care conversations.    Subjective: Chart reviewed including labs, progress notes, imaging from this and previous encounters.  EEG reviewed- generalized dysfunction- no  seizures.  ABG with hypercapnia. Discussed with RN- plan is for MRI and to start patient on Bipap.  Met at bedside with patient's son.   Answered his questions regarding need for bipap and what a bipap is. I explained that this demonstrated a worsening of her condition and that utilizing bipap was serious. He was surprised to hear this information.  GOC continue to be life prolonging.   Review of Systems  Unable to perform ROS: Mental status change     Physical Exam Vitals and nursing note reviewed.  Constitutional:      Appearance: She is ill-appearing.  Cardiovascular:     Rate and Rhythm: Normal rate.  Neurological:     Comments: Non verbal, does not wake to my voice or touch  Vital Signs: BP (!) 130/55 (BP Location: Right Arm)   Pulse 87   Temp 99 F (37.2 C) (Oral)   Resp (!) 28   Ht 5' 5 (1.651 m)   Wt 87.4 kg   SpO2 98%   BMI 32.06 kg/m  SpO2: SpO2: 98 % O2 Device: O2 Device: Nasal Cannula O2 Flow Rate: O2 Flow Rate (L/min): 2 L/min  Intake/output summary:  Intake/Output Summary (Last 24 hours) at 03/23/2024 1551 Last data filed at 03/23/2024 1200 Gross per 24 hour  Intake 2592.76 ml  Output --  Net 2592.76 ml   LBM: Last BM Date : 03/22/24 Baseline Weight: Weight: 83.5 kg Most recent weight: Weight: 87.4 kg       Palliative Assessment/Data: PPS: 10% (with cortrak in place)      Patient Active Problem List   Diagnosis Date Noted   Severe aortic stenosis 03/19/2024   Pneumonitis 03/19/2024   AKI (acute kidney injury) 03/18/2024   Hypernatremia 03/18/2024   Abnormal EKG 03/18/2024   SIRS (systemic inflammatory response syndrome) (HCC) 03/18/2024   Acute CVA (cerebrovascular accident) (HCC) 03/17/2024   BP (high blood pressure) 10/02/2014   Asthma, chronic 10/02/2014   Essential hypertension 10/02/2014   Obesity 10/02/2014   Sleep apnea 10/02/2014    Palliative Care Assessment & Plan    Assessment/Recommendations/Plan  Family  continues to request current interventions- limit  set at DNR Full scope - will likely want PEG   Code Status:   Code Status: Do not attempt resuscitation (DNR) PRE-ARREST INTERVENTIONS DESIRED   Prognosis:  Unable to determine  Discharge Planning: To Be Determined  Care plan was discussed with patient's son, daughter, and attending MD.   Thank you for allowing the Palliative Medicine Team to assist in the care of this patient.  Total time: 55 minutes Prolonged billing:  Time includes:   Preparing to see the patient (e.g., review of tests) Obtaining and/or reviewing separately obtained history Performing a medically necessary appropriate examination and/or evaluation Counseling and educating the patient/family/caregiver Ordering medications, tests, or procedures Referring and communicating with other health care professionals (when not reported separately) Documenting clinical information in the electronic or other health record Independently interpreting results (not reported separately) and communicating results to the patient/family/caregiver Care coordination (not reported separately) Clinical documentation  Cassondra Stain, AGNP-C Palliative Medicine   Please contact Palliative Medicine Team phone at [phone] for questions and concerns.

## 2024-03-23 NOTE — Progress Notes (Signed)
 Patient transferred off floor to MRI then we will transport to 2A. Family members aware of the plan.

## 2024-03-24 DIAGNOSIS — I639 Cerebral infarction, unspecified: Secondary | ICD-10-CM | POA: Diagnosis not present

## 2024-03-24 DIAGNOSIS — Z515 Encounter for palliative care: Secondary | ICD-10-CM | POA: Diagnosis not present

## 2024-03-24 DIAGNOSIS — I63523 Cerebral infarction due to unspecified occlusion or stenosis of bilateral anterior cerebral arteries: Secondary | ICD-10-CM | POA: Diagnosis not present

## 2024-03-24 DIAGNOSIS — N179 Acute kidney failure, unspecified: Secondary | ICD-10-CM | POA: Diagnosis not present

## 2024-03-24 DIAGNOSIS — R6510 Systemic inflammatory response syndrome (SIRS) of non-infectious origin without acute organ dysfunction: Secondary | ICD-10-CM | POA: Diagnosis not present

## 2024-03-24 LAB — RENAL FUNCTION PANEL
Albumin: 3.2 g/dL — ABNORMAL LOW (ref 3.5–5.0)
Anion gap: 7 (ref 5–15)
BUN: 17 mg/dL (ref 8–23)
CO2: 32 mmol/L (ref 22–32)
Calcium: 9.3 mg/dL (ref 8.9–10.3)
Chloride: 111 mmol/L (ref 98–111)
Creatinine, Ser: 0.67 mg/dL (ref 0.44–1.00)
GFR, Estimated: >60 mL/min (ref >60)
Glucose, Bld: 112 mg/dL — ABNORMAL HIGH (ref 70–99)
Phosphorus: 4.1 mg/dL (ref 2.5–4.6)
Potassium: 4.7 mmol/L (ref 3.5–5.1)
Sodium: 150 mmol/L — ABNORMAL HIGH (ref 135–145)

## 2024-03-24 LAB — GLUCOSE, CAPILLARY
Glucose-Capillary: 103 mg/dL — ABNORMAL HIGH (ref 70–99)
Glucose-Capillary: 110 mg/dL — ABNORMAL HIGH (ref 70–99)
Glucose-Capillary: 114 mg/dL — ABNORMAL HIGH (ref 70–99)
Glucose-Capillary: 124 mg/dL — ABNORMAL HIGH (ref 70–99)
Glucose-Capillary: 152 mg/dL — ABNORMAL HIGH (ref 70–99)
Glucose-Capillary: 157 mg/dL — ABNORMAL HIGH (ref 70–99)

## 2024-03-24 MED ORDER — LATANOPROST 0.005 % OP SOLN
1.0000 [drp] | Freq: Every day | OPHTHALMIC | Status: DC
  Administered 2024-03-24 – 2024-04-03 (×11): 1 [drp] via OPHTHALMIC
  Filled 2024-03-24: qty 2.5

## 2024-03-24 MED ORDER — LOSARTAN POTASSIUM 25 MG PO TABS
25.0000 mg | ORAL_TABLET | Freq: Every day | ORAL | Status: DC
  Administered 2024-03-24 – 2024-04-02 (×10): 25 mg
  Filled 2024-03-24 (×10): qty 1

## 2024-03-24 MED ORDER — ZINC SULFATE 220 (50 ZN) MG PO CAPS
220.0000 mg | ORAL_CAPSULE | Freq: Every day | ORAL | Status: DC
  Administered 2024-03-24 – 2024-04-04 (×12): 220 mg
  Filled 2024-03-24 (×12): qty 1

## 2024-03-24 MED ORDER — CYANOCOBALAMIN 1000 MCG/ML IJ SOLN
1000.0000 ug | Freq: Every day | INTRAMUSCULAR | Status: AC
  Administered 2024-03-24 – 2024-03-30 (×7): 1000 ug via INTRAMUSCULAR
  Filled 2024-03-24 (×7): qty 1

## 2024-03-24 MED ORDER — VITAMIN C 500 MG PO TABS
500.0000 mg | ORAL_TABLET | Freq: Two times a day (BID) | ORAL | Status: DC
  Administered 2024-03-24 – 2024-04-04 (×23): 500 mg
  Filled 2024-03-24 (×24): qty 1

## 2024-03-24 MED ORDER — ORAL CARE MOUTH RINSE: 15.0000 mL | OROMUCOSAL | Status: DC | PRN

## 2024-03-24 MED ORDER — ORAL CARE MOUTH RINSE
15.0000 mL | OROMUCOSAL | Status: DC
  Administered 2024-03-24 – 2024-04-04 (×46): 15 mL via OROMUCOSAL

## 2024-03-24 NOTE — Consult Note (Signed)
 [facility] CLIN[facility] CARDIOLOGY CONSULT NOTE       Patient ID: HAVYN RAMO MRN: [id number] DOB/AGE: [birth year] 78 y.o.  Admit date: 03/17/2024 Referring Physician Caleen Primary Physician Tobie Domino, MD Primary Cardiologist none Reason for Consultation questionable Afib  HPI: Marie Pham is a 78 y.o. female  with a past medical history of hypertension and sleep apnea who presented to the ED on 03/17/2024 for acute CVA. Cardiology was placed on consultation for evaluation of possible Afib.  CTA head and neck showed acute/recent left basal ganglia infarct, recommending MRI to assess for acute infarct.  Also shows multiple areas of varying levels of stenosis in the PCA and MCA territory.  Patient seen and examined at bedside. She is obtunded and not responding to verbal stimuli. Daughter at bedside states patient has not been able to communicate since being in the hospital. Stroke appears to be BP related rather than embolic from cardiac source. She was found to have severe AS however goals of care will need to be discussed.  Review of systems complete and found to be negative unless listed above     Past Medical History:  Diagnosis Date   Asthma    Depression    Hypertension    Sleep apnea     Past Surgical History:  Procedure Laterality Date   ABDOMINAL HYSTERECTOMY     COLON SURGERY     EYE SURGERY Left    HERNIA REPAIR      Medications Prior to Admission  Medication Sig Dispense Refill Last Dose/Taking   albuterol  (PROVENTIL  HFA;VENTOLIN  HFA) 108 (90 BASE) MCG/ACT inhaler Inhale into the lungs.   Past Week   ARIPiprazole (ABILIFY) 2 MG tablet Take 2 mg by mouth daily.   Past Week   brimonidine-timolol (COMBIGAN) 0.2-0.5 % ophthalmic solution Apply 1 drop to eye every 12 (twelve) hours.   Past Week   buPROPion (WELLBUTRIN XL) 150 MG 24 hr tablet Take 150 mg by mouth daily.   Past Week   celecoxib (CELEBREX) 200 MG capsule Take 200 mg by mouth 2 (two) times daily.   Past  Week   Cholecalciferol (VITAMIN D3) 1.25 MG (50000 UT) CAPS Take 1 capsule by mouth once a week.   Past Week   famotidine  (PEPCID ) 40 MG tablet Take 1 tablet (40 mg total) by mouth every evening. 30 tablet 1 Past Week   latanoprost (XALATAN) 0.005 % ophthalmic solution Place 1 drop into both eyes at bedtime.   Past Week   losartan  (COZAAR ) 100 MG tablet Take 100 mg by mouth daily.   Past Week   Menthol-Methyl Salicylate (MUSCLE RUB) 10-15 % CREA Apply 1 Application topically as needed. 85 g 0 Unknown   naproxen (NAPROSYN) 500 MG tablet Take 500 mg by mouth 2 (two) times daily.   Unknown   PARoxetine (PAXIL) 20 MG tablet Take 20 mg by mouth daily.   Past Week   Fluticasone-Salmeterol (ADVAIR) 250-50 MCG/DOSE AEPB Inhale into the lungs. (Patient not taking: Reported on 03/20/2024)   Not Taking   hydrochlorothiazide  (HYDRODIURIL ) 12.5 MG tablet Take by mouth.      lisinopril (PRINIVIL,ZESTRIL) 10 MG tablet Take by mouth. (Patient not taking: Reported on 03/20/2024)   Not Taking   losartan  (COZAAR ) 50 MG tablet Take by mouth.      QUEtiapine  (SEROQUEL ) 200 MG tablet Take 1 tablet (200 mg total) by mouth at bedtime. (Patient not taking: Reported on 03/20/2024) 30 tablet 1 Not Taking   sucralfate  (CARAFATE ) 1 g  tablet Take 1 tablet (1 g total) by mouth 4 (four) times daily for 15 days. 60 tablet 0    SYMBICORT 160-4.5 MCG/ACT inhaler 2 puffs daily. (Patient not taking: Reported on 03/20/2024)   Not Taking   venlafaxine  XR (EFFEXOR -XR) 150 MG 24 hr capsule i pill in am (Patient not taking: Reported on 03/20/2024) 30 capsule 1 Not Taking   Social History   Socioeconomic History   Marital status: Widowed    Spouse name: Not on file   Number of children: Not on file   Years of education: Not on file   Highest education level: Not on file  Occupational History   Not on file  Tobacco Use   Smoking status: Former    Current packs/day: 0.00    Types: Cigarettes    Quit date: 10/02/1994    Years since  quitting: 29.4   Smokeless tobacco: Never  Substance and Sexual Activity   Alcohol use: No    Alcohol/week: 0.0 standard drinks of alcohol   Drug use: No   Sexual activity: Never  Other Topics Concern   Not on file  Social History Narrative   Not on file   Social Drivers of Health   Financial Resource Strain: High Risk (09/01/2023)   Received from [facility]   Overall Financial Resource Strain (CARDIA)    Difficulty of Paying Living Expenses: Very hard  Food Insecurity: Patient Unable To Answer (03/18/2024)   Hunger Vital Sign    Worried About Running Out of Food in the Last Year: Patient unable to answer    Ran Out of Food in the Last Year: Patient unable to answer  Transportation Needs: Patient Unable To Answer (03/18/2024)   PRAPARE - Transportation    Lack of Transportation (Medical): Patient unable to answer    Lack of Transportation (Non-Medical): Patient unable to answer  Physical Activity: Not on file  Stress: Not on file  Social Connections: Patient Unable To Answer (03/18/2024)   Social Connection and Isolation Panel    Frequency of Communication with Friends and Family: Patient unable to answer    Frequency of Social Gatherings with Friends and Family: Patient unable to answer    Attends Religious Services: Patient unable to answer    Active Member of Clubs or Organizations: Patient unable to answer    Attends Banker Meetings: Patient unable to answer    Marital Status: Patient unable to answer  Intimate Partner Violence: Patient Unable To Answer (03/18/2024)   Humiliation, Afraid, Rape, and Kick questionnaire    Fear of Current or Ex-Partner: Patient unable to answer    Emotionally Abused: Patient unable to answer    Physically Abused: Patient unable to answer    Sexually Abused: Patient unable to answer    Family History  Problem Relation Age of Onset   Hypertension Sister    Alcohol abuse Brother    Hypertension Brother     Diabetes Brother      Vitals:   03/24/24 0900 03/24/24 1000 03/24/24 1100 03/24/24 1153  BP: 127/61 139/61 (!) 136/51   Pulse: 92 87    Resp: (!) 35 (!) 33 (!) 37   Temp:    97.9 F (36.6 C)  TempSrc:    Axillary  SpO2: 96% 98%    Weight:      Height:        PHYSICAL EXAM General: obtunded, non-responsive HEENT: Normocephalic and atraumatic. Neck: No JVD.  Lungs: Normal respiratory effort. Clear  bilaterally to auscultation. No wheezes, crackles, rhonchi.  Heart: HRRR. Normal S1 and S2 without gallops or murmurs.  Abdomen: Non-distended appearing.  Extremities: Warm and well perfused. No clubbing, cyanosis. no edema.  Neuro: obtunded   Labs: Basic Metabolic Panel: Recent Labs    03/22/24 0426 03/23/24 0940 03/24/24 0457  NA 147* 145 150*  K 4.5 4.8 4.7  CL 111 110 111  CO2 26 28 32  GLUCOSE 150* 196* 112*  BUN 17 17 17   CREATININE 0.65 0.65 0.67  CALCIUM  9.0 8.9 9.3  MG 2.5*  --   --   PHOS 3.6 3.5 4.1   Liver Function Tests: Recent Labs    03/23/24 0940 03/24/24 0457  ALBUMIN 3.1* 3.2*   No results for input(s): LIPASE, AMYLASE in the last 72 hours. CBC: Recent Labs    03/22/24 0426  WBC 9.9  HGB 12.5  HCT 39.5  MCV 89.6  PLT 238   Cardiac Enzymes: No results for input(s): CKTOTAL, CKMB, CKMBINDEX, TROPONINIHS in the last 72 hours. BNP: No results for input(s): BNP in the last 72 hours. D-Dimer: No results for input(s): DDIMER in the last 72 hours. Hemoglobin A1C: No results for input(s): HGBA1C in the last 72 hours. Fasting Lipid Panel: No results for input(s): CHOL, HDL, LDLCALC, TRIG, CHOLHDL, LDLDIRECT in the last 72 hours. Thyroid  Function Tests: Recent Labs    03/23/24 1028  TSH 2.260   Anemia Panel: Recent Labs    03/23/24 1028  VITAMINB12 165*     Radiology: DG Abd 1 View Result Date: 03/23/2024 EXAM: 1 VIEW XRAY OF THE ABDOMEN 03/23/2024 05:54:51 PM COMPARISON: Comparison with 03/20/2024.  CLINICAL HISTORY: [id number] Encounter for care related to feeding tube [id number] Encounter for care related to feeding tube FINDINGS: LIMITATIONS/ARTIFACTS: Limited field of view for tube placement verification purposes. LINES, TUBES AND DEVICES: An enteric tube is present with the tip projecting over the upper mid-abdomen, consistent with a location in the body of the stomach. BOWEL: Nonobstructive bowel gas pattern. SOFT TISSUES: No opaque urinary calculi. BONES: No acute osseous abnormality. IMPRESSION: 1. Enteric tube tip projects over the upper mid-abdomen, consistent with a location in the body of the stomach. Electronically signed by: Elsie Gravely MD 03/23/2024 06:19 PM EST RP Workstation: [id number]   MR BRAIN WO CONTRAST Result Date: 03/23/2024 EXAM: MRI BRAIN WITHOUT CONTRAST 03/23/2024 04:04:32 PM TECHNIQUE: Multiplanar multisequence MRI of the head/brain was performed without the administration of intravenous contrast. COMPARISON: Brain MRI 03/18/2024, and earlier exams. CLINICAL HISTORY: 78 year old female with recent bilateral ACA territory infarcts, left greater than right. FINDINGS: BRAIN AND VENTRICLES: Progressed bilaterally ACA territory infarcts and cytotoxic edema, left greater than right. No hemorrhagic transformation. No new areas of acute infarction. New intrinsic T1 hyperintensity within the left caudate, possibly petechial blood. No other acute intracranial hemorrhage. No intracranial mass effect or midline shift. No hydrocephalus. The sella is unremarkable. Visible major vascular flow voids remain stable, including probable maintained patency at the ACA origins. Gyral enlargement more pronounced in the left ACA territory, and confluent involvement of the left half of the corpus callosum as seen on series 17 image 19. Elnor and white matter signal elsewhere is stable including bilateral confluent but nonspecific periventricular white matter T2 and FLAIR hyperintensity. The left caudate  nucleus which appeared edematous and enlarged previously now appears virtually normal (series 8 images 14 and 15). However, there is new intrinsic T1 hyperintensity within the left caudate as seen on series 14 image 84. This might be  petechial blood. No malignant hemorrhagic transformation or mass effect. Chronic lacunar infarct at the ventral right thalamus again noted. No convincing involvement of other vascular territories. Chronic small vessel disease. ORBITS: Orbit detail limited today by susceptibility artifact. SINUSES AND MASTOIDS: Paranasal sinus detail limited today by susceptibility artifact. Mild increased bilateral mastoid air cell effusions. BONES AND SOFT TISSUES: Metal susceptibility artifact associated with the face, ethmoid sinuses. Negative visible cervical spine. IMPRESSION: 1. Progressed bilateral ACA territory infarcts with cytotoxic edema, left greater than right. No hemorrhagic transformation. No significant mass effect. 2. Evolution of left caudate infarct with possible petechial hemorrhage, but no malignant hemorrhagic transformation or mass effect. 3. No new vascular territory ischemia. Chronic small vessel disease. Electronically signed by: Helayne Hurst MD 03/23/2024 05:35 PM EST RP Workstation: [id number]   EEG adult Result Date: 03/22/2024 Michaela Aisha SQUIBB, MD     03/23/2024 10:07 AM History: 78 yo F with CVA and ams, EEG to evaluated for seizures EEG Duration: 30 minutes Sedation: none Patient State: Awake and drowsy Technique: This EEG was acquired with electrodes placed according to the International 10-20 electrode system (including Fp1, Fp2, F3, F4, C3, C4, P3, P4, O1, O2, T3, T4, T5, T6, A1, A2, Fz, Cz, Pz). The following electrodes were missing or displaced: none. Background: The background somewhat disorganized, with pronounced irregular theta and delta range activities.  There is a posterior dominant rhythm of 8 to 8.5 Hz that is well-sustained.  There are occasional  bifrontally predominant discharges with a shifting lateral predominance with triphasic morphology. Photic stimulation: Physiologic driving is not performed EEG Abnormalities: 1) triphasic waves 2) generalized irregular slow activity Clinical Interpretation: This EEG is consistent with a generalized nonspecific cerebral dysfunction, though triphasic waves are nonspecific and they can be associated with toxic/metabolic etiologies of encephalopathy. There was no seizure or seizure predisposition recorded on this study. Please note that lack of epileptiform activity on EEG does not preclude the possibility of epilepsy. Aisha Michaela, MD [facility] If 7pm- 7am, please page neurology on call as listed in AMION.  DG Chest Port 1 View Result Date: 03/21/2024 CLINICAL DATA:  Pneumonia EXAM: PORTABLE CHEST 1 VIEW COMPARISON:  Chest radiograph dated 03/18/2024 FINDINGS: Lines/tubes: Gastric/enteric tube tip projects over the stomach. Lungs: Low lung volumes with bronchovascular crowding. No focal consolidation. Pleura: No pneumothorax or pleural effusion. Heart/mediastinum: The heart size and mediastinal contours are within normal limits. Bones: No acute osseous abnormality. IMPRESSION: Low lung volumes with bronchovascular crowding. No focal consolidation. Electronically Signed   By: Limin  Xu M.D.   On: 03/21/2024 08:50   DG [place] MATSU Tube Plc W/Fl W/Rad Result Date: 03/20/2024 INDICATION: Protein-calorie malnutrition. EXAM: NASO G TUBE PLACEMENT WITH FL AND WITH RAD COMPARISON:  None Available. CONTRAST:  None. FLUOROSCOPY TIME:  Radiation Exposure Index (as provided by the fluoroscopic device): 13.4 mGy Kerma COMPLICATIONS: None. PROCEDURE: The Dobbhoff tube was lubricated with viscous lidocaine inserted into the right nostril. Under intermittent fluoroscopic guidance, the Dobbhoff tube was advanced through the stomach. However, despite many attempts, the tube was not amenable to post-pyloric  placement. A spot fluoroscopic image was saved for documentation purposes. The tube was affixed to the patient's nose with tape. The patient tolerated the procedure well without immediate postprocedural complication. FINDINGS: Successful fluoroscopic guided placement of Dobbhoff tube with tip within the gastric lumen. IMPRESSION: Successful fluoroscopic guided placement of Dobbhoff tube with tip within the gastric lumen. The tube is ready for immediate use. Performed by: Carlin Griffon, PA-C Supervised and  interpreted by: Newell Eke, MD Electronically Signed   By: Newell Eke M.D.   On: 03/20/2024 10:06   CT Angio Chest Pulmonary Embolism (PE) W or WO Contrast Result Date: 03/18/2024 CLINICAL DATA:  Pulmonary embolism suspected, high probability. Shortness of breath. EXAM: CT ANGIOGRAPHY CHEST WITH CONTRAST TECHNIQUE: Multidetector CT imaging of the chest was performed using the standard protocol during bolus administration of intravenous contrast. Multiplanar CT image reconstructions and MIPs were obtained to evaluate the vascular anatomy. RADIATION DOSE REDUCTION: This exam was performed according to the departmental dose-optimization program which includes automated exposure control, adjustment of the mA and/or kV according to patient size and/or use of iterative reconstruction technique. CONTRAST:  75mL OMNIPAQUE  IOHEXOL  350 MG/ML SOLN COMPARISON:  11/09/2022, 01/12/2016. FINDINGS: Cardiovascular: Heart is mildly enlarged and there is a trace pericardial effusion. Scattered coronary artery calcifications are present. There is atherosclerotic calcification of the aorta without evidence of aneurysm. Pulmonary trunk is normal in caliber. No definite evidence of pulmonary embolism is seen. Examination is limited due to respiratory motion artifact. Mediastinum/Nodes: No mediastinal, hilar, or axillary lymphadenopathy is seen. The thyroid  gland, trachea, and esophagus are within normal limits. Lungs/Pleura:  Hazy ground-glass attenuation is present in the lungs bilaterally. Atelectasis is noted bilaterally. No effusion or pneumothorax is seen. Multiple nodules are noted bilaterally, the largest measuring 4 mm in the right middle lobe, axial image 61, and 4 mm in the left upper lobe, axial image 40, unchanged from 2017 and likely benign. Upper Abdomen: Hyperdense material is present within the gallbladder likely representing excreted contrast from previous examination. Cysts are noted in the right kidney. No acute abnormality. Musculoskeletal: Degenerative changes are present in the thoracic spine. No acute osseous abnormality. Review of the MIP images confirms the above findings. IMPRESSION: 1. No evidence of pulmonary embolism. 2. Ground-glass attenuation in the lungs bilaterally, possible air trapping or pneumonitis. 3. Cardiomegaly with coronary artery calcifications. 4. Aortic atherosclerosis. Electronically Signed   By: Leita Birmingham M.D.   On: 03/18/2024 15:42   ECHOCARDIOGRAM COMPLETE Result Date: 03/18/2024    ECHOCARDIOGRAM REPORT   Patient Name:   Marie Pham Date of Exam: 03/18/2024 Medical Rec #:  [id number]     Height:       65.0 in Accession #:    [id number]    Weight:       184.1 lb Date of Birth:  May 25, 1945    BSA:          1.910 m Patient Age:    77 years      BP:           140/57 mmHg Patient Gender: F             HR:           119 bpm. Exam Location:  [facility] Procedure: 2D Echo, Cardiac Doppler and Color Doppler (Both Spectral and Color            Flow Doppler were utilized during procedure). Indications:     Stroke I63.9  History:         Patient has no prior history of Echocardiogram examinations.                  Risk Factors:Hypertension.  Sonographer:     Bari Roar Referring Phys:  [id number] DELAYNE LULLA SOLIAN Diagnosing Phys: Denyse Bathe  Sonographer Comments: Technically difficult study due to poor echo windows and patient is obese. Image acquisition challenging due to patient body habitus  and Image acquisition challenging due to respiratory motion. IMPRESSIONS  1. Left ventricular ejection fraction, by estimation, is 60 to 65%. The left ventricle has normal function. The left ventricle has no regional wall motion abnormalities. Left ventricular diastolic parameters are consistent with Grade III diastolic dysfunction (restrictive).  2. Right ventricular systolic function is normal. The right ventricular size is normal.  3. Left atrial size was mildly dilated.  4. Right atrial size was mildly dilated.  5. The mitral valve is normal in structure. Trivial mitral valve regurgitation. No evidence of mitral stenosis.  6. The aortic valve is calcified. Aortic valve regurgitation is trivial. Severe aortic valve stenosis.  7. The inferior vena cava is normal in size with greater than 50% respiratory variability, suggesting right atrial pressure of 3 mmHg. FINDINGS  Left Ventricle: Left ventricular ejection fraction, by estimation, is 60 to 65%. The left ventricle has normal function. The left ventricle has no regional wall motion abnormalities. Strain was performed and the global longitudinal strain is indeterminate. The left ventricular internal cavity size was normal in size. There is no left ventricular hypertrophy. Left ventricular diastolic parameters are consistent with Grade III diastolic dysfunction (restrictive). Right Ventricle: The right ventricular size is normal. No increase in right ventricular wall thickness. Right ventricular systolic function is normal. Left Atrium: Left atrial size was mildly dilated. Right Atrium: Right atrial size was mildly dilated. Pericardium: There is no evidence of pericardial effusion. Mitral Valve: The mitral valve is normal in structure. Trivial mitral valve regurgitation. No evidence of mitral valve stenosis. Tricuspid Valve: The tricuspid valve is normal in structure. Tricuspid valve regurgitation is trivial. No evidence of tricuspid stenosis. Aortic Valve: The  aortic valve is calcified. Aortic valve regurgitation is trivial. Severe aortic stenosis is present. Aortic valve mean gradient measures 32.5 mmHg. Aortic valve peak gradient measures 57.3 mmHg. Aortic valve area, by VTI measures 0.53 cm. Pulmonic Valve: The pulmonic valve was normal in structure. Pulmonic valve regurgitation is not visualized. No evidence of pulmonic stenosis. Aorta: The aortic root is normal in size and structure. Venous: The inferior vena cava is normal in size with greater than 50% respiratory variability, suggesting right atrial pressure of 3 mmHg. IAS/Shunts: No atrial level shunt detected by color flow Doppler. Additional Comments: 3D was performed not requiring image post processing on an independent workstation and was indeterminate.  LEFT VENTRICLE PLAX 2D LVIDd:         3.60 cm   Diastology LVIDs:         2.40 cm   LV e' medial:    7.15 cm/s LV PW:         1.20 cm   LV E/e' medial:  24.2 LV IVS:        1.70 cm   LV e' lateral:   7.93 cm/s LVOT diam:     1.70 cm   LV E/e' lateral: 21.8 LV SV:         32 LV SV Index:   17 LVOT Area:     2.27 cm  RIGHT VENTRICLE RV S prime:     10.70 cm/s LEFT ATRIUM           Index LA diam:      3.20 cm 1.68 cm/m LA Vol (A4C): 33.6 ml 17.60 ml/m  AORTIC VALVE                     PULMONIC VALVE AV Area (Vmax):    0.76 cm  PV Vmax:        1.56 m/s AV Area (Vmean):   0.62 cm      PV Peak grad:   9.7 mmHg AV Area (VTI):     0.53 cm      RVOT Peak grad: 6 mmHg AV Vmax:           378.50 cm/s AV Vmean:          258.000 cm/s AV VTI:            0.591 m AV Peak Grad:      57.3 mmHg AV Mean Grad:      32.5 mmHg LVOT Vmax:         127.00 cm/s LVOT Vmean:        70.400 cm/s LVOT VTI:          0.139 m LVOT/AV VTI ratio: 0.24  AORTA Ao Root diam: 2.30 cm Ao Asc diam:  2.80 cm MITRAL VALVE                TRICUSPID VALVE MV Area (PHT): 7.59 cm     TR Peak grad:   18.8 mmHg MV Decel Time: 100 msec     TR Vmax:        217.00 cm/s MV E velocity: 173.00 cm/s MV A  Prime:    13.7 cm/s    SHUNTS                             Systemic VTI:  0.14 m                             Systemic Diam: 1.70 cm Denyse Bathe Electronically signed by Denyse Bathe Signature Date/Time: 03/18/2024/2:42:42 PM    Final    MR BRAIN WO CONTRAST Result Date: 03/18/2024 EXAM: MRI BRAIN WITHOUT CONTRAST 03/18/2024 12:53:00 PM TECHNIQUE: Multiplanar multisequence MRI of the head/brain was performed without the administration of intravenous contrast. COMPARISON: CT head and CTA head and neck 03/17/2024. Brain MRI 07/17/2023. CLINICAL HISTORY: 78 year old female with acute neuro deficit, stroke suspected. FINDINGS: BRAIN: Confluent restricted diffusion throughout much of the left ACA territory (series 5 image 36, series 7 image 28) with T2 and FLAIR hyperintense cytotoxic edema. Similar intense restricted diffusion also in the anterior and inferior caudate nucleus, corresponding to head CT finding yesterday. Caudate is mildly expanded by cytotoxic edema. No hemorrhagic transformation. There is also subtle contralateral proximal right ACA territory infarct with restricted diffusion and cytotoxic edema affecting the right inferior frontal gyrus (series 5 image 24, series 9 image 24). And Faint right superior frontal gyrus white matter subcentimeter restricted diffusion (series 5 image 39) is noted in the right MCA / ACA watershed territory. Chronic periventricular and scattered cerebral white matter T2 and FLAIR hyperintensity is moderate for age and stable outside of the acute findings. Minimal chronic microhemorrhage suspected in the brain such as left occipital lobe series 12 image 27. Chronic lacunar infarct in the ventral right thalamus. No significant intracranial mass effect. No midline shift. No hydrocephalus. The sella is unremarkable. Normal flow voids. Mediovascular phleboliths at the skull base are preserved. No other diffusion restriction. Brainstem and cerebellum are stable. ORBITS: No acute  abnormality. SINUSES AND MASTOIDS: Stable mild left mastoid effusion. Negative visible nasopharynx. BONES AND SOFT TISSUES: Hyperostosis of the calvarium, normal variant. Normal marrow signal. No acute soft tissue abnormality. Negative visible cervical spine.  IMPRESSION: 1. Confluent Left ACA territory acute infarct, including involvement of the inferomedial left caudate nucleus. Additionally, small area of acute infarct in the proximal right ACA territory, and several sub centimeter foci of acute ischemia in the distal right MCA/ACA watershed. 2. Cytotoxic edema with no hemorrhagic transformation or significant mass effect. 3. Elsewhere stable chronic small vessel disease. Electronically signed by: Helayne Hurst MD 03/18/2024 01:04 PM EST RP Workstation: [id number]   DG Chest Port 1 View Result Date: 03/18/2024 EXAM: 1 VIEW(S) XRAY OF THE CHEST 03/18/2024 10:29:00 AM COMPARISON: Radiographs from 07/17/2023. CTA head and neck from 03/17/2024. Radiographs from 03/18/2024 (earlier today). CLINICAL HISTORY: 78 year old female with pneumonia. FINDINGS: LUNGS AND PLEURA: Low lung volumes. Stable linear scarring or subsegmental atelectasis at left base. The right suprahilar opacity appears chronic but increased when compared to radiographs from 07/17/2023. Appearance unchanged from earlier today. CTA neck yesterday negative aside from gas trapping. No pleural effusion. No pneumothorax. HEART AND MEDIASTINUM: Aortic atherosclerosis. No acute abnormality of the cardiac and mediastinal silhouettes. BONES AND SOFT TISSUES: No acute osseous abnormality. IMPRESSION: 1. Streaky right suprahilar opacity persists, CTA neck yesterday negative aside from gas trapping. Favor atelectasis rather than developing infection. 2. No new cardiopulmonary abnormality. Electronically signed by: Helayne Hurst MD 03/18/2024 10:37 AM EST RP Workstation: [id number]   DG Chest Port 1 View Result Date: 03/18/2024 EXAM: 1 VIEW(S) XRAY OF THE CHEST  03/18/2024 01:45:55 AM COMPARISON: 07/17/2023 CLINICAL HISTORY: Shortness of breath. FINDINGS: LIMITATIONS/ARTIFACTS: Shallow inspiration. LUNGS AND PLEURA: Linear atelectasis in the left base and right mid lung. No focal consolidation. No pleural effusion. No pneumothorax. HEART AND MEDIASTINUM: Calcification of the aorta. BONES AND SOFT TISSUES: Degenerative changes in the spine and shoulders. IMPRESSION: 1. Linear atelectasis in the left base and right mid lung, which may contribute to shortness of breath. 2. No acute cardiopulmonary process identified. Electronically signed by: Elsie Gravely MD 03/18/2024 01:52 AM EST RP Workstation: [id number]   CT ANGIO HEAD NECK W WO CM Result Date: 03/17/2024 EXAM: CTA Head and Neck with Intravenous Contrast. CT Head without Contrast. CLINICAL HISTORY: Neuro deficit, acute, stroke suspected Neuro deficit, acute, stroke suspected TECHNIQUE: Axial CTA images of the head and neck performed with intravenous contrast. MIP reconstructed images were created and reviewed. Axial computed tomography images of the head/brain performed without intravenous contrast. Note: Per PQRS, the description of internal carotid artery percent stenosis, including 0 percent or normal exam, is based on North American Symptomatic Carotid Endarterectomy Trial (NASCET) criteria. Dose reduction technique was used including one or more of the following: automated exposure control, adjustment of mA and kV according to patient size, and/or iterative reconstruction. CONTRAST: With; COMPARISON: CT head November 09, 2023 FINDINGS: CT HEAD: BRAIN: Limited CT head with suggestion of hypodensity in the left basal ganglia (series 2 image 14). No acute intraparenchymal hemorrhage. No mass lesion. No midline shift or extra-axial collection. VENTRICLES: No hydrocephalus. ORBITS: The orbits are unremarkable. SINUSES AND MASTOIDS: The paranasal sinuses and mastoid air cells are clear. CTA NECK: COMMON CAROTID  ARTERIES: No significant stenosis. No dissection or occlusion. INTERNAL CAROTID ARTERIES: No stenosis by NASCET criteria. No dissection or occlusion. VERTEBRAL ARTERIES: Moderate stenosis of the left vertebral artery origin. Occlusion versus severe stenosis of the nondominant distal right intradural vertebral artery. Dominant left vertebral artery is patent without significant stenosis. CTA HEAD: ANTERIOR CEREBRAL ARTERIES: No significant stenosis. No occlusion. No aneurysm. Hypoplastic right A1 ACA. MIDDLE CEREBRAL ARTERIES: Mild to moderate left M1 and proximal M2 MCA stenoses.  No occlusion. No aneurysm. POSTERIOR CEREBRAL ARTERIES: Severe bilateral P2 PCA stenoses. No occlusion. No aneurysm. BASILAR ARTERY: No significant stenosis. No occlusion. No aneurysm. OTHER: SOFT TISSUES: No acute finding. No masses or lymphadenopathy. BONES: No acute osseous abnormality. IMPRESSION: 1. Limited CT head with hypodensity in the left basal ganglia that is suspicious for acute/recent infarct. Recommend MRI to assess for acute infarct. 2. 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 3. Severe bilateral P2 PCA stenoses. 4. Moderate left vertebral artery origin stenosis. 5. Mild to moderate left M1 and M2 MCA stenoses. Electronically signed by: Gilmore Molt MD 03/17/2024 09:54 PM EST RP Workstation: [id number]   CT ABDOMEN PELVIS W CONTRAST Result Date: 03/17/2024 EXAM: CT ABDOMEN AND PELVIS WITH CONTRAST 03/17/2024 05:47:17 PM TECHNIQUE: CT of the abdomen and pelvis was performed with the administration of intravenous contrast. 100 mL of iohexol  (OMNIPAQUE ) 300 MG/ML solution was administered. Multiplanar reformatted images are provided for review. Automated exposure control, iterative reconstruction, and/or weight-based adjustment of the mA/kV was utilized to reduce the radiation dose to as low as reasonably achievable. COMPARISON: None available. CLINICAL HISTORY: LLQ abdominal pain.  FINDINGS: LOWER CHEST: Treatment artifact in the lung bases. 4 mm nodule in the right costophrenic angle. LIVER: Fatty infiltration of the liver. GALLBLADDER AND BILE DUCTS: Gallbladder is unremarkable. No biliary ductal dilatation. SPLEEN: Circumscribed low attenuation lesion in the spleen measuring 3 cm diameter. This is likely a cyst or hemangioma. PANCREAS: No acute abnormality. ADRENAL GLANDS: No acute abnormality. KIDNEYS, URETERS AND BLADDER: Bilateral renal cysts. No stones in the kidneys or ureters. No hydronephrosis. No perinephric or periureteral stranding. Urinary bladder is unremarkable. Per consensus, no follow-up is needed for simple Bosniak type 1 and 2 renal cysts, unless the patient has a malignancy history or risk factors. GI AND BOWEL: Stomach demonstrates no acute abnormality. Diverticulosis of the sigmoid colon. No evidence of acute diverticulitis. There is no bowel obstruction. PERITONEUM AND RETROPERITONEUM: No ascites. No free air. VASCULATURE: Calcification of the aorta. No aneurysm. Aorta is normal in caliber. LYMPH NODES: No lymphadenopathy. REPRODUCTIVE ORGANS: No acute abnormality. BONES AND SOFT TISSUES: Previous left hip arthroplasty. Degenerative changes in the spine. Degenerative changes in the right hip. Port. No acute osseous abnormality. No focal soft tissue abnormality. IMPRESSION: 1. No acute findings. 2. 4 mm right costophrenic angle pulmonary nodule; given incomplete chest coverage, per Fleischner Society Guidelines for incidental nodules 5 mm, no routine follow-up is recommended for low-risk or unknown-risk patients without high-risk nodule features; if high-risk for malignancy or if high-risk nodule features are present, an optional non-contrast chest CT at 12 months may be considered; if performed and stable at 12 months, no further follow-up is needed. Electronically signed by: Elsie Gravely MD 03/17/2024 05:58 PM EST RP Workstation: [id number]    ECHO 1. Left  ventricular ejection fraction, by estimation, is 60 to 65%. The  left ventricle has normal function. The left ventricle has no regional  wall motion abnormalities. Left ventricular diastolic parameters are  consistent with Grade III diastolic  dysfunction (restrictive).   2. Right ventricular systolic function is normal. The right ventricular  size is normal.   3. Left atrial size was mildly dilated.   4. Right atrial size was mildly dilated.   5. The mitral valve is normal in structure. Trivial mitral valve  regurgitation. No evidence of mitral stenosis.   6. The aortic valve is calcified. Aortic valve regurgitation is trivial.  Severe aortic valve stenosis.   7. The inferior  vena cava is normal in size with greater than 50%  respiratory variability, suggesting right atrial pressure of 3 mmHg.   TELEMETRY reviewed by me [facility]) 03/24/2024 : NSR  EKG reviewed by me: sinus tachycardia rate 110 bpm with RBBB and LPFB. No EKGs showing Afib  Data reviewed by me [facility]) 03/24/2024: last 24h vitals tele labs imaging I/O provider notes  Principal Problem:   Acute CVA (cerebrovascular accident) [facility]) Active Problems:   Essential hypertension   AKI (acute kidney injury)   Hypernatremia   Abnormal EKG   SIRS (systemic inflammatory response syndrome) (HCC)   Severe aortic stenosis   Pneumonitis    ASSESSMENT AND PLAN:   Acute CVA with encephalopathy  Patient with right-sided weakness, aphasia with unknown last normal. Permissive hypertension for first 24-48 hrs post stroke onset. Statin for LDL goal less than 70 Continue ASA 81mg  daily, Plavix  75mg  daily x 3 weeks then monotherapy thereafter. Maintain on telemetry No evidence of Afib. Patient is in NSR. Patient still obtunded and non-responsive   Severe aortic stenosis Patient will need to evaluated for SAVR vs TAVR. She is not good candidate for SAVR in my opinion. Recommend goals of care discussion as patient is obtunded and  non-responsive.  Will schedule will [facility] outpatient for AVR Will need to avoid dehydration, tachycardia  AKI with mild troponin elevation No evidence of ACS at this time Troponin very mildly elevated due to AKI Nephrology following Would not continue to trend trops   Essential hypertension Restart home meds as BP allows Permissive hypertension allowed first 24-28 hours   Signed: Maddock Finigan, DO 03/24/2024, 12:16 PM [facility]

## 2024-03-24 NOTE — Consult Note (Signed)
 WOC Nurse Consult Note: Reason for Consult: buttocks wound  Wound type: Deep Tissue Pressure Injury sacrum/B buttocks dark discoloration that is evolving with red moist areas to buttocks and linear red moist area coccyx  Pressure Injury POA:no; no photo documentation, nothing on flowsheet until 11/27  Measurement:see nursing flowsheet  Wound bed: as above  Drainage (amount, consistency, odor) see nursing flowsheet  Periwound: lifting of epidermis  Dressing procedure/placement/frequency: Cleanse sacrum/coccyx/buttocks with Vashe wound cleanser [name] [phone]) do not rinse. Apply Xeroform gauze ([place]son [phone]) to wound beds daily and secure with silicone foam or ABD pad and clothe tape whichever is preferred.   Deep tissue Pressure Injuries are high risk to deteriorate.  WOC team will follow every 7 to 10 days to reassess area and change POC as needed. Please reconsult earlier if acute changes.    Thank you,    Powell Bar MSN, RN-BC, [employer]

## 2024-03-24 NOTE — Plan of Care (Signed)
  Problem: Clinical Measurements: Goal: Cardiovascular complication will be avoided Outcome: Progressing   Problem: Elimination: Goal: Will not experience complications related to bowel motility Outcome: Progressing Goal: Will not experience complications related to urinary retention Outcome: Progressing   Problem: Safety: Goal: Ability to remain free from injury will improve Outcome: Progressing   Problem: Education: Goal: Knowledge of disease or condition will improve Outcome: Progressing   Problem: Nutrition: Goal: Risk of aspiration will decrease Outcome: Progressing

## 2024-03-24 NOTE — Progress Notes (Signed)
   03/24/24 1100  Spiritual Encounters  Type of Visit Initial  Care provided to: Pt not available  Reason for visit Advance directives (Chaplain attempted to deliver, but pt was asleep)  Spiritual Care Plan  Spiritual Care Issues Still Outstanding No further spiritual care needs at this time (see row info)

## 2024-03-24 NOTE — Progress Notes (Signed)
 Physical Therapy Treatment Patient Details Name: Marie Pham MRN: [id number] DOB: [birth year] Today's Date: 03/24/2024   History of Present Illness Pt is a 78 y/o female presenting with aphasia and R sided weakness with last known well over 24 hours prior. 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 Chest Xray showed atelectasis in L and R lungs. PMH: HTN, arthritis, chronic back pain, aortic stenosis.    PT Comments  Pt was long sitting in bed asleep upon arrival. She did not awake throughout session and remains unresponsive. Family members (granddaughter and another family member) at bedside. Session only focused on family education and PROM due to pt's unresponsiveness. She did not moan or make facial expressions when author rolled pt to the R to place pillow for more offloading. Author did perform PROM to all extremities with only noticing tightness in bilateral hips. Family educated on positioning and pt needs until alertness changes. Due to lack of responsiveness/alertness,  will decrease PT to 1 x a week. Can and will increase frequency once pt demonstrates improvements, however, currently not appropriate for increased EOB/OOB activity. Will need mechanical lift for any & all transfers.    If plan is discharge home, recommend the following: Two people to help with walking and/or transfers;Two people to help with bathing/dressing/bathroom;Assistance with cooking/housework;Assistance with feeding;Direct supervision/assist for medications management;Direct supervision/assist for financial management;Assist for transportation;Help with stairs or ramp for entrance;Supervision due to cognitive status     Equipment Recommendations  Other (comment) (ongoing assessment due to lack of progress)       Precautions / Restrictions Precautions Precautions: Other (comment) (NGT) Recall of Precautions/Restrictions: Impaired Precaution/Restrictions  Comments: NGT Restrictions Weight Bearing Restrictions Per Provider Order: No     Mobility  Bed Mobility  General bed mobility comments: pt did not moan or make any response to author rolling towards R side for additional pillow support for unweighting.              Cognition Arousal: Lethargic (pt is unresponsive)     PT - Cognitive impairments: Difficult to assess Difficult to assess due to: Level of arousal   PT - Cognition Comments: pt did not open or even attempt to open eyes to painful stimulous. Session included only family education on stretching/PROM they can perform until if pt become more alert/arousable Following commands: Impaired Following commands impaired:  (did not follow any commands)          General Comments General comments (skin integrity, edema, etc.): Session only included PROM to all extremitied with family education on how to perform to prevent contractures. Pt unsafe to attempt any mobility/transfers due to level of arousal or alertness. pt unrespoinsive throughout session. no faciual expressions to painful stimulous      Pertinent Vitals/Pain Pain Assessment Breathing: occasional labored breathing, short period of hyperventilation Negative Vocalization: none Facial Expression: smiling or inexpressive Body Language: relaxed Consolability: no need to console PAINAD Score: 1     PT Goals (current goals can now be found in the care plan section) Acute Rehab PT Goals Patient Stated Goal: none stated Progress towards PT goals: Not progressing toward goals - comment (unresponsiveness)    Frequency    Min 1X/week (will decrease frequency due to level of alertness and inability to actually participate in PT session.)       AM-PAC PT 6 Clicks Mobility   Outcome Measure  Help needed turning from your back to  your side while in a flat bed without using bedrails?: Total Help needed moving from lying on your back to sitting on the side of a  flat bed without using bedrails?: Total Help needed moving to and from a bed to a chair (including a wheelchair)?: Total Help needed standing up from a chair using your arms (e.g., wheelchair or bedside chair)?: Total Help needed to walk in hospital room?: Total Help needed climbing 3-5 steps with a railing? : Total 6 Click Score: 6    End of Session Equipment Utilized During Treatment: Oxygen (elevated RR off on throughout session) Activity Tolerance: Patient limited by lethargy Patient left: in bed;with call bell/phone within reach;with bed alarm set;with family/visitor present   PT Visit Diagnosis: Muscle weakness (generalized) (M62.81);Hemiplegia and hemiparesis;Adult, failure to thrive (R62.7);History of falling (Z91.81) Hemiplegia - Right/Left: Right Hemiplegia - dominant/non-dominant: Dominant Hemiplegia - caused by: Cerebral infarction     Time: 8486-8476 PT Time Calculation (min) (ACUTE ONLY): 10 min  Charges:    $Therapeutic Activity: 8-22 mins PT General Charges $$ ACUTE PT VISIT: 1 Visit                    Rankin Essex PTA 03/24/24, 4:50 PM

## 2024-03-24 NOTE — TOC Initial Note (Signed)
 Transition of Care [facility]) - Initial/Assessment Note    Patient Details  Name: Marie Pham MRN: [id number] Date of Birth: December 27, 1945  [facility] CM/SW Contact:    Alfonso Rummer, LCSW Phone Number: 03/24/2024, 2:41 PM  Clinical Narrative:                 LCSW A. Rummer met with pt daughter and extended family members in room 247. Pt was unconscious during TOC visit. Pt daughter reports pt will transition to [facility] when she is medically ready. Pt daughter requested assistance regarding POA to arrange pt's health insurance enrollment. LCSW A Rummer informed pt daughter hospital social workers do not advise on poa paperwork.   Expected Discharge Plan: Skilled Nursing Facility Barriers to Discharge: Continued Medical Work up   Patient Goals and CMS Choice            Expected Discharge Plan and Services     Post Acute Care Choice: Skilled Nursing Facility Living arrangements for the past 2 months: Single Family Home                                      Prior Living Arrangements/Services Living arrangements for the past 2 months: Single Family Home Lives with:: Self, Adult Children Patient language and need for interpreter reviewed:: Yes Do you feel safe going back to the place where you live?: Yes      Need for Family Participation in Patient Care: Yes (Comment) Care giver support system in place?: Yes (comment) Current home services: DME Criminal Activity/Legal Involvement Pertinent to Current Situation/Hospitalization: No - Comment as needed  Activities of Daily Living      Permission Sought/Granted                  Emotional Assessment Appearance:: Appears stated age, Well-Groomed Attitude/Demeanor/Rapport: Unresponsive, Lethargic Affect (typically observed): Unable to Assess (Patient sleeping, Lethargic) Orientation: :  (Unable to Assess. Pt sleeping) Alcohol / Substance Use: Not Applicable Psych Involvement: No  (comment)  Admission diagnosis:  Acute ischemic stroke [facility]) [I63.9] Acute CVA (cerebrovascular accident) [facility]) [I63.9] Patient Active Problem List   Diagnosis Date Noted   Severe aortic stenosis 03/19/2024   Pneumonitis 03/19/2024   AKI (acute kidney injury) 03/18/2024   Hypernatremia 03/18/2024   Abnormal EKG 03/18/2024   SIRS (systemic inflammatory response syndrome) (HCC) 03/18/2024   Acute CVA (cerebrovascular accident) (HCC) 03/17/2024   BP (high blood pressure) 10/02/2014   Asthma, chronic 10/02/2014   Essential hypertension 10/02/2014   Obesity 10/02/2014   Sleep apnea 10/02/2014   PCP:  Tobie Domino, MD Pharmacy:   [facility] - [place], [place] - [address] [place] [address] Phone: [phone] Fax: [phone]  [id number] - [place], [place] - [address] [place] [address] Phone: [phone] Fax: [phone]     Social Drivers of Health (SDOH) Social History: SDOH Screenings   Food Insecurity: Patient Unable To Answer (03/18/2024)  Housing: Patient Unable To Answer (03/18/2024)  Transportation Needs: Patient Unable To Answer (03/18/2024)  Utilities: Patient Unable To Answer (03/18/2024)  Financial Resource Strain: High Risk (09/01/2023)   Received from [facility]  Social Connections: Patient Unable To Answer (03/18/2024)  Tobacco Use: Medium Risk (03/17/2024)   SDOH Interventions:     Readmission Risk Interventions     No data  to display

## 2024-03-24 NOTE — Progress Notes (Signed)
 NEUROLOGY CONSULT FOLLOW UP NOTE   Date of service: March 24, 2024 Patient Name: Marie Pham MRN:  [id number] DOB:  09-21-1945  Interval Hx/subjective   Continues to be encephalopathic  Vitals   Vitals:   03/24/24 0623 03/24/24 0700 03/24/24 0800 03/24/24 0812  BP:  (!) 147/55 (!) 137/59 (!) 137/59  Pulse:  90 93 94  Resp:  (!) 27 (!) 31   Temp: 99.3 F (37.4 C)  99.8 F (37.7 C)   TempSrc: Oral  Axillary   SpO2:  97% 98%   Weight:      Height:         Body mass index is 31.95 kg/m.  Physical Exam   General: In bed, NG tube in place  Neurologic Examination    MS: Opens eyes partially to noxious stimulation, does not follow commands, look at examiner, or otherwise interact meaningfully, occasionally groans. CN: Pupils are reactive, VOR intact, has roving movements, blinks to eyelid stimulation bilaterally Motor: Flexion to noxious stimulation bilaterally in UE, no movement in LE Sensory: As above  Medications  Current Facility-Administered Medications:    acetaminophen  (TYLENOL ) tablet 650 mg, 650 mg, Oral, Q4H PRN, 650 mg at 03/23/24 0946 **OR** acetaminophen  (TYLENOL ) 160 MG/5ML solution 650 mg, 650 mg, Per Tube, Q4H PRN **OR** acetaminophen  (TYLENOL ) suppository 650 mg, 650 mg, Rectal, Q4H PRN, Cleatus Delayne GAILS, MD, 650 mg at 03/24/24 0416   aspirin  chewable tablet 81 mg, 81 mg, Per NG tube, Daily, Jens Durand, MD, 81 mg at 03/24/24 9187   atorvastatin  (LIPITOR ) tablet 80 mg, 80 mg, Per NG tube, Daily, Jens Durand, MD, 80 mg at 03/24/24 9187   carvedilol  (COREG ) tablet 12.5 mg, 12.5 mg, Per NG tube, BID WC, Jens Durand, MD, 12.5 mg at 03/24/24 9187   clopidogrel  (PLAVIX ) tablet 75 mg, 75 mg, Per NG tube, Daily, Jens Durand, MD, 75 mg at 03/24/24 9187   cyanocobalamin  (VITAMIN B12) injection 1,000 mcg, 1,000 mcg, Intramuscular, Q0600, Amin, Sumayya, MD   enoxaparin  (LOVENOX ) injection 40 mg, 40 mg, Subcutaneous, Q24H, Niels Kayla FALCON, RPH, 40 mg  at 03/23/24 9062   feeding supplement (OSMOLITE 1.5 CAL) liquid 1,000 mL, 1,000 mL, Per Tube, Continuous, Ayiku, Bernard, MD, Last Rate: 50 mL/hr at 03/22/24 0647, 1,000 mL at 03/22/24 0647   feeding supplement (PROSource TF20) liquid 60 mL, 60 mL, Per Tube, Daily, Jens Durand, MD, 60 mL at 03/23/24 0947   free water  200 mL, 200 mL, Per Tube, Q6H, Amin, Amaryllis, MD, 200 mL at 03/23/24 1802   lactulose  (CHRONULAC ) 10 GM/15ML solution 30 g, 30 g, Per Tube, BID, Amin, Sumayya, MD, 30 g at 03/24/24 0812   latanoprost (XALATAN) 0.005 % ophthalmic solution 1 drop, 1 drop, Both Eyes, QHS, Amin, Sumayya, MD   losartan  (COZAAR ) tablet 25 mg, 25 mg, Per Tube, Daily, Amin, Sumayya, MD   multivitamin with minerals tablet 1 tablet, 1 tablet, Per Tube, Daily, Jens Durand, MD, 1 tablet at 03/24/24 9187   Oral care mouth rinse, 15 mL, Mouth Rinse, 4 times per day, Amin, Sumayya, MD   Oral care mouth rinse, 15 mL, Mouth Rinse, PRN, Amin, Sumayya, MD   thiamine  (VITAMIN B1) tablet 100 mg, 100 mg, Per Tube, Daily, Jens Durand, MD, 100 mg at 03/24/24 9187  Labs and Diagnostic Imaging   LDL 168 A1c 6.1  EEG - encephalopathic  Imaging(Personally reviewed): MRI - much more extensive bilateral frontal infarcts in the same distribution CTA-both the ACAs are fed by the left  ACOM with minimal contribution (I do not see any right ACOM) from the right side  Assessment   Marie Pham is a 78 y.o. female with bilateral ACA territory infarcts, the left was much more involved than the right.  There is an irregularity right where the ACAs come off of the ACOM, and I am wondering if this finally occluded.  Bifrontal strokes can often result in severely diminished motivation and can result in akinetic mutism, and she does have significant bifrontal strokes. The strips listed as afib were reviewed and are not consistent with afib.   There is a chance that she could have some gradual improvement over time, though  this is not guaranteed. 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 I discussed this with the daughter and emphasized that future decision should be made based on what the patient would desire in the setting of long term expected severe disability.  She expressed understanding, they are planning on supporting her with PEG tube, etc.  Recommendations  Continue aspirin  and Plavix  Continue atorvastatin  80mg  daily Continue family discussions  ______________________________________________________________________   Signed, Aisha Seals, MD [facility]

## 2024-03-24 NOTE — Progress Notes (Signed)
 Patient tube feed stop due to possible aspiration. Dr. Lawence aware

## 2024-03-24 NOTE — Progress Notes (Addendum)
 Nutrition Follow-up  DOCUMENTATION CODES:   Obesity unspecified  INTERVENTION:   -TF via NGT:    Osmolite 1.5 @ 50 ml/hr    60 ml Prosource TF20 daily   200 ml free water  flush every 6 hours per MD   Tube feeding regimen provides 1880 kcal (100% of needs), 95 grams of protein, and 914 ml of H2O. Total free water : 1714 ml daily    -Continue MVI with minerals daily via tube -Continue 100 mg thiamine  daily x 7 days via tube -500 mg vitamin C BID via tube -220 mg zinc sulfate daily x 14 days via tube -Monitor Mg, K, and Phos and replete as needed secondary to high refeeding risk  -Per palliative care notes, plan for full scope care; family is considering PEG placement -If patient experiences persistent hyperglycemia (defined as >=180 mg/dL (89.9 mmol/L on two or more occasions), recommend considering initiation of insulin therapy and/or consulting diabetes coordinator team for further recommendations   NUTRITION DIAGNOSIS:   Inadequate oral intake related to inability to eat, dysphagia as evidenced by NPO status.  Ongoing  GOAL:   Patient will meet greater than or equal to 90% of their needs  Met with TF  MONITOR:   Diet advancement, TF tolerance  REASON FOR ASSESSMENT:   Consult Enteral/tube feeding initiation and management  ASSESSMENT:   78 y.o. female with medical history significant for Hypertension, arthritis, chronic back pain, ambulance at baseline with a walker being admitted for an acute CVA.  11/22- NPO with ice chips 11/23- bedside NGT placement attempted by RN, unsuccessful; per SLP unable to assess due to lethargy 11/24- NGT placed via fluoroscopy (tip of tube in lumen per PA note); TF initiated 11/24- s/p BSE- NPO 11/25- rapid response called secondary to lethargy and tachypenia, SLP signed off 11/26- s/p EEG- consistent with a generalized nonspecific cerebral dysfunction, though triphasic waves are nonspecific and they can be associated with  toxic/metabolic etiologies of encephalopathy; no seizure activity noted  Reviewed I/O's: +400 ml x 24 hours and +8.9 L since admission  Patient lying in bed at time of visit, being turned and cleaned by RN and nurse tech. Per RN, tube feedings have been held since 2100 due to concern for aspiration. RN has messaged MD to discuss resuming TF; noted TF were resumed at goal rate of 50 ml/hr at 1220 today.   Per [facility] notes, patient with deep tissue pressure injury to sacrum and bilateral buttocks; dark discoloration evolving with red moist areas to buttocks and linear red moist area coccyx.   Palliative care following for goals of care; patient family still desire goals of care. Per RN, plan for PEG on Wednesday (03/30/24) due to need to hold plavix  prior to procedure.   No weight loss since admission. Noted weight has ranged from 83.5 kg-87.1 kg since admission.   Medications reviewed and include vitamin B-12, lovenox , lactulose , and thiamine .   Labs reviewed: Na: 150, CBGS: 103-218 (inpatient orders for glycemic control are ).  Noted one stance of hyperglycemia on 03/23/24 at 2020; per discussion with RN, TF was held at that time.   Diet Order:   Diet Order             Diet NPO time specified  Diet effective midnight           Diet NPO time specified  Diet effective now                   EDUCATION NEEDS:  Education needs have been addressed  Skin:  Skin Assessment: Reviewed RN Assessment  Last BM:  03/24/24 (type 7)  Height:   Ht Readings from Last 1 Encounters:  03/17/24 5' 5 (1.651 m)    Weight:   Wt Readings from Last 1 Encounters:  03/24/24 87.1 kg    Ideal Body Weight:  56.8 kg  BMI:  Body mass index is 31.95 kg/m.  Estimated Nutritional Needs:   Kcal:  1800-2000  Protein:  95-110 grams  Fluid:  1.8-2.0 L    Margery ORN, RD, LDN, CDCES Registered Dietitian III Certified Diabetes Care and Education Specialist If unable to reach this RD, please  use RD Inpatient group chat on secure chat between hours of 8am-4 pm daily

## 2024-03-24 NOTE — Consult Note (Addendum)
 Chief Complaint: Patient was seen in consultation today for dysphagia and malnutrition, with consideration for G-tube placement.  Referring Provider(s): Dr. Amaryllis Dare, MD.   Supervising Physician: Karalee Beat  Patient Status: [name] - In-pt  Current Code Status  Do not attempt resuscitation (DNR) PRE-ARREST INTERVENTIONS DESIRED - Set by Arloa Waddell RAMAN, NP at 03/19/2024 1604 (View report)  Question Answer  If pulseless and not breathing No CPR or chest compressions.  In Pre-Arrest Conditions (Patient Has Pulse and Is Breathing) May intubate, use advanced airway interventions and cardioversion/ACLS medications if appropriate or indicated. May transfer to ICU.  Consent: Discussion documented in EHR or advanced directives reviewed     Patient currently has DNR order in place. Discussion with the patient and family regarding wishes.  The DNR order is rescinded during the procedure and the patient consents to the use of any resuscitation procedure needed to treat the clinical events that occur.   History of Present Illness: Marie Pham is a 78 y.o. female  with PMHx notable for recent bilateral ACA territory infarcts, HTN, chronic back pain, arthritis, OSA, Asthma/bronchitis, depression, and patient ambulates with a walker at baseline.  Patient was initially seen in ED on 11/21 for poor oral intake and speech difficulties. Patient was admitted for CVA, and became less responsive shortly after admission. Currently patient is minimally responsive to noxious stimuli, requiring intermittent BiPAP. IR was consulted for and placed Coretrack tube on 11/24, and patient is receiving tube feeds.  Hospice care is consulted as well, though family has requested full life-preserving care. Patient is DNR at this time.   Interventional Radiology was requested for gastrostomy tube placement. Request was reviewed and approved by Dr. Karalee. Patient is tentatively scheduled for same in IR  on 12/3, due to Plavix  5-day washout delay.   Patient is laying in bed, and does not respond to verbal or tactile stimuli. Her daughters and extended family are at bedside. Unable to obtain ROS.   Past Medical History:  Diagnosis Date   Asthma    Depression    Hypertension    Sleep apnea     Past Surgical History:  Procedure Laterality Date   ABDOMINAL HYSTERECTOMY     COLON SURGERY     EYE SURGERY Left    HERNIA REPAIR      Allergies: Lansoprazole, Penicillins, Prozac [fluoxetine], and Bupropion  Medications: Prior to Admission medications   Medication Sig Start Date End Date Taking? Authorizing Provider  albuterol  (PROVENTIL  HFA;VENTOLIN  HFA) 108 (90 BASE) MCG/ACT inhaler Inhale into the lungs.   Yes [provider]  ARIPiprazole (ABILIFY) 2 MG tablet Take 2 mg by mouth daily. 03/16/24  Yes [provider]  brimonidine-timolol (COMBIGAN) 0.2-0.5 % ophthalmic solution Apply 1 drop to eye every 12 (twelve) hours. 12/16/17  Yes [provider]  buPROPion (WELLBUTRIN XL) 150 MG 24 hr tablet Take 150 mg by mouth daily. 03/16/24  Yes [provider]  celecoxib (CELEBREX) 200 MG capsule Take 200 mg by mouth 2 (two) times daily. 01/04/24  Yes [provider]  Cholecalciferol (VITAMIN D3) 1.25 MG (50000 UT) CAPS Take 1 capsule by mouth once a week. 01/04/24  Yes [provider]  famotidine  (PEPCID ) 40 MG tablet Take 1 tablet (40 mg total) by mouth every evening. 06/08/21 03/20/24 Yes Arlander Charleston, MD  latanoprost (XALATAN) 0.005 % ophthalmic solution Place 1 drop into both eyes at bedtime. 12/23/23  Yes [provider]  losartan  (COZAAR ) 100 MG tablet  Take 100 mg by mouth daily. 01/04/24  Yes [provider]  Menthol-Methyl Salicylate (MUSCLE RUB) 10-15 % CREA Apply 1 Application topically as needed. 11/09/22  Yes Cyrena Mylar, MD  naproxen (NAPROSYN) 500 MG tablet Take 500 mg by mouth 2 (two) times daily. 03/16/24  Yes  [provider]  PARoxetine (PAXIL) 20 MG tablet Take 20 mg by mouth daily. 02/18/24  Yes [provider]  Fluticasone-Salmeterol (ADVAIR) 250-50 MCG/DOSE AEPB Inhale into the lungs. Patient not taking: Reported on 03/20/2024    [provider]  hydrochlorothiazide  (HYDRODIURIL ) 12.5 MG tablet Take by mouth. 05/29/14 05/29/15  [provider]  lisinopril (PRINIVIL,ZESTRIL) 10 MG tablet Take by mouth. Patient not taking: Reported on 03/20/2024    [provider]  losartan  (COZAAR ) 50 MG tablet Take by mouth. 05/01/14 01/12/16  [provider]  QUEtiapine  (SEROQUEL ) 200 MG tablet Take 1 tablet (200 mg total) by mouth at bedtime. Patient not taking: Reported on 03/20/2024 01/01/15   Mohammed Goldstein, MD  sucralfate  (CARAFATE ) 1 g tablet Take 1 tablet (1 g total) by mouth 4 (four) times daily for 15 days. 06/08/21 06/23/21  Arlander Charleston, MD  SYMBICORT 160-4.5 MCG/ACT inhaler 2 puffs daily. Patient not taking: Reported on 03/20/2024 03/16/24   [provider]  venlafaxine  XR (EFFEXOR -XR) 150 MG 24 hr capsule i pill in am Patient not taking: Reported on 03/20/2024 01/01/15   Mohammed Goldstein, MD     Family History  Problem Relation Age of Onset   Hypertension Sister    Alcohol abuse Brother    Hypertension Brother    Diabetes Brother     Social History   Socioeconomic History   Marital status: Widowed    Spouse name: Not on file   Number of children: Not on file   Years of education: Not on file   Highest education level: Not on file  Occupational History   Not on file  Tobacco Use   Smoking status: Former    Current packs/day: 0.00    Types: Cigarettes    Quit date: 10/02/1994    Years since quitting: 29.4   Smokeless tobacco: Never  Substance and Sexual Activity   Alcohol use: No    Alcohol/week: 0.0 standard drinks of alcohol   Drug use: No   Sexual activity: Never  Other Topics Concern   Not on file  Social History Narrative    Not on file   Social Drivers of Health   Financial Resource Strain: High Risk (09/01/2023)   Received from [facility]   Overall Financial Resource Strain (CARDIA)    Difficulty of Paying Living Expenses: Very hard  Food Insecurity: Patient Unable To Answer (03/18/2024)   Hunger Vital Sign    Worried About Running Out of Food in the Last Year: Patient unable to answer    Ran Out of Food in the Last Year: Patient unable to answer  Transportation Needs: Patient Unable To Answer (03/18/2024)   PRAPARE - Transportation    Lack of Transportation (Medical): Patient unable to answer    Lack of Transportation (Non-Medical): Patient unable to answer  Physical Activity: Not on file  Stress: Not on file  Social Connections: Patient Unable To Answer (03/18/2024)   Social Connection and Isolation Panel    Frequency of Communication with Friends and Family: Patient unable to answer    Frequency of Social Gatherings with Friends and Family: Patient unable to answer    Attends Religious Services: Patient unable to  answer    Active Member of Clubs or Organizations: Patient unable to answer    Attends Club or Organization Meetings: Patient unable to answer    Marital Status: Patient unable to answer     Review of Systems: Patient unresponsive. Cannot obtain ROS.  Vital Signs: BP 139/61   Pulse 87   Temp 99.8 F (37.7 C) (Axillary)   Resp (!) 33   Ht 5' 5 (1.651 m)   Wt 192 lb 0.3 oz (87.1 kg)   SpO2 98%   BMI 31.95 kg/m   Advance Care Plan: The advanced care place/surrogate decision maker was discussed at the time of visit and the patient did not wish to discuss or was not able to name a surrogate decision maker or provide an advance care plan.  Physical Exam Constitutional:      Appearance: She is obese. She is ill-appearing.     Comments: Patient not responsive at current baseline.  HENT:     Head:     Comments: NGT/Coretrack in place.    Mouth/Throat:      Comments: Patient cannot follow commands. Cannot examine oral cavity at this time. Cardiovascular:     Rate and Rhythm: Normal rate and regular rhythm.     Pulses: Normal pulses.     Heart sounds: Normal heart sounds.  Pulmonary:     Effort: Tachypnea present.     Breath sounds: Rales present.  Abdominal:     General: Abdomen is flat. Bowel sounds are normal. There is no distension.     Palpations: Abdomen is soft.  Musculoskeletal:        General: Normal range of motion.  Skin:    General: Skin is warm and dry.  Neurological:     Comments: Patient not responsive at current baseline.  Psychiatric:     Comments: Patient not responsive at current baseline.     Imaging: DG Abd 1 View Result Date: 03/23/2024 EXAM: 1 VIEW XRAY OF THE ABDOMEN 03/23/2024 05:54:51 PM COMPARISON: Comparison with 03/20/2024. CLINICAL HISTORY: [id number] Encounter for care related to feeding tube [id number] Encounter for care related to feeding tube FINDINGS: LIMITATIONS/ARTIFACTS: Limited field of view for tube placement verification purposes. LINES, TUBES AND DEVICES: An enteric tube is present with the tip projecting over the upper mid-abdomen, consistent with a location in the body of the stomach. BOWEL: Nonobstructive bowel gas pattern. SOFT TISSUES: No opaque urinary calculi. BONES: No acute osseous abnormality. IMPRESSION: 1. Enteric tube tip projects over the upper mid-abdomen, consistent with a location in the body of the stomach. Electronically signed by: Elsie Gravely MD 03/23/2024 06:19 PM EST RP Workstation: [id number]   MR BRAIN WO CONTRAST Result Date: 03/23/2024 EXAM: MRI BRAIN WITHOUT CONTRAST 03/23/2024 04:04:32 PM TECHNIQUE: Multiplanar multisequence MRI of the head/brain was performed without the administration of intravenous contrast. COMPARISON: Brain MRI 03/18/2024, and earlier exams. CLINICAL HISTORY: 78 year old female with recent bilateral ACA territory infarcts, left greater than right.  FINDINGS: BRAIN AND VENTRICLES: Progressed bilaterally ACA territory infarcts and cytotoxic edema, left greater than right. No hemorrhagic transformation. No new areas of acute infarction. New intrinsic T1 hyperintensity within the left caudate, possibly petechial blood. No other acute intracranial hemorrhage. No intracranial mass effect or midline shift. No hydrocephalus. The sella is unremarkable. Visible major vascular flow voids remain stable, including probable maintained patency at the ACA origins. Gyral enlargement more pronounced in the left ACA territory, and confluent involvement of the left half of the corpus callosum as seen on series  17 image 19. Elnor and white matter signal elsewhere is stable including bilateral confluent but nonspecific periventricular white matter T2 and FLAIR hyperintensity. The left caudate nucleus which appeared edematous and enlarged previously now appears virtually normal (series 8 images 14 and 15). However, there is new intrinsic T1 hyperintensity within the left caudate as seen on series 14 image 84. This might be petechial blood. No malignant hemorrhagic transformation or mass effect. Chronic lacunar infarct at the ventral right thalamus again noted. No convincing involvement of other vascular territories. Chronic small vessel disease. ORBITS: Orbit detail limited today by susceptibility artifact. SINUSES AND MASTOIDS: Paranasal sinus detail limited today by susceptibility artifact. Mild increased bilateral mastoid air cell effusions. BONES AND SOFT TISSUES: Metal susceptibility artifact associated with the face, ethmoid sinuses. Negative visible cervical spine. IMPRESSION: 1. Progressed bilateral ACA territory infarcts with cytotoxic edema, left greater than right. No hemorrhagic transformation. No significant mass effect. 2. Evolution of left caudate infarct with possible petechial hemorrhage, but no malignant hemorrhagic transformation or mass effect. 3. No new vascular  territory ischemia. Chronic small vessel disease. Electronically signed by: Helayne Hurst MD 03/23/2024 05:35 PM EST RP Workstation: [id number]   EEG adult Result Date: 03/22/2024 Michaela Aisha SQUIBB, MD     03/23/2024 10:07 AM History: 78 yo F with CVA and ams, EEG to evaluated for seizures EEG Duration: 30 minutes Sedation: none Patient State: Awake and drowsy Technique: This EEG was acquired with electrodes placed according to the International 10-20 electrode system (including Fp1, Fp2, F3, F4, C3, C4, P3, P4, O1, O2, T3, T4, T5, T6, A1, A2, Fz, Cz, Pz). The following electrodes were missing or displaced: none. Background: The background somewhat disorganized, with pronounced irregular theta and delta range activities.  There is a posterior dominant rhythm of 8 to 8.5 Hz that is well-sustained.  There are occasional bifrontally predominant discharges with a shifting lateral predominance with triphasic morphology. Photic stimulation: Physiologic driving is not performed EEG Abnormalities: 1) triphasic waves 2) generalized irregular slow activity Clinical Interpretation: This EEG is consistent with a generalized nonspecific cerebral dysfunction, though triphasic waves are nonspecific and they can be associated with toxic/metabolic etiologies of encephalopathy. There was no seizure or seizure predisposition recorded on this study. Please note that lack of epileptiform activity on EEG does not preclude the possibility of epilepsy. Aisha Michaela, MD [facility] If 7pm- 7am, please page neurology on call as listed in AMION.  DG Chest Port 1 View Result Date: 03/21/2024 CLINICAL DATA:  Pneumonia EXAM: PORTABLE CHEST 1 VIEW COMPARISON:  Chest radiograph dated 03/18/2024 FINDINGS: Lines/tubes: Gastric/enteric tube tip projects over the stomach. Lungs: Low lung volumes with bronchovascular crowding. No focal consolidation. Pleura: No pneumothorax or pleural effusion. Heart/mediastinum: The heart  size and mediastinal contours are within normal limits. Bones: No acute osseous abnormality. IMPRESSION: Low lung volumes with bronchovascular crowding. No focal consolidation. Electronically Signed   By: Limin  Xu M.D.   On: 03/21/2024 08:50   DG [place] MATSU Tube Plc W/Fl W/Rad Result Date: 03/20/2024 INDICATION: Protein-calorie malnutrition. EXAM: NASO G TUBE PLACEMENT WITH FL AND WITH RAD COMPARISON:  None Available. CONTRAST:  None. FLUOROSCOPY TIME:  Radiation Exposure Index (as provided by the fluoroscopic device): 13.4 mGy Kerma COMPLICATIONS: None. PROCEDURE: The Dobbhoff tube was lubricated with viscous lidocaine inserted into the right nostril. Under intermittent fluoroscopic guidance, the Dobbhoff tube was advanced through the stomach. However, despite many attempts, the tube was not amenable to post-pyloric placement. A spot fluoroscopic image was saved for  documentation purposes. The tube was affixed to the patient's nose with tape. The patient tolerated the procedure well without immediate postprocedural complication. FINDINGS: Successful fluoroscopic guided placement of Dobbhoff tube with tip within the gastric lumen. IMPRESSION: Successful fluoroscopic guided placement of Dobbhoff tube with tip within the gastric lumen. The tube is ready for immediate use. Performed by: Carlin Griffon, PA-C Supervised and interpreted by: Newell Eke, MD Electronically Signed   By: Newell Eke M.D.   On: 03/20/2024 10:06   CT Angio Chest Pulmonary Embolism (PE) W or WO Contrast Result Date: 03/18/2024 CLINICAL DATA:  Pulmonary embolism suspected, high probability. Shortness of breath. EXAM: CT ANGIOGRAPHY CHEST WITH CONTRAST TECHNIQUE: Multidetector CT imaging of the chest was performed using the standard protocol during bolus administration of intravenous contrast. Multiplanar CT image reconstructions and MIPs were obtained to evaluate the vascular anatomy. RADIATION DOSE REDUCTION: This exam was performed  according to the departmental dose-optimization program which includes automated exposure control, adjustment of the mA and/or kV according to patient size and/or use of iterative reconstruction technique. CONTRAST:  75mL OMNIPAQUE  IOHEXOL  350 MG/ML SOLN COMPARISON:  11/09/2022, 01/12/2016. FINDINGS: Cardiovascular: Heart is mildly enlarged and there is a trace pericardial effusion. Scattered coronary artery calcifications are present. There is atherosclerotic calcification of the aorta without evidence of aneurysm. Pulmonary trunk is normal in caliber. No definite evidence of pulmonary embolism is seen. Examination is limited due to respiratory motion artifact. Mediastinum/Nodes: No mediastinal, hilar, or axillary lymphadenopathy is seen. The thyroid  gland, trachea, and esophagus are within normal limits. Lungs/Pleura: Hazy ground-glass attenuation is present in the lungs bilaterally. Atelectasis is noted bilaterally. No effusion or pneumothorax is seen. Multiple nodules are noted bilaterally, the largest measuring 4 mm in the right middle lobe, axial image 61, and 4 mm in the left upper lobe, axial image 40, unchanged from 2017 and likely benign. Upper Abdomen: Hyperdense material is present within the gallbladder likely representing excreted contrast from previous examination. Cysts are noted in the right kidney. No acute abnormality. Musculoskeletal: Degenerative changes are present in the thoracic spine. No acute osseous abnormality. Review of the MIP images confirms the above findings. IMPRESSION: 1. No evidence of pulmonary embolism. 2. Ground-glass attenuation in the lungs bilaterally, possible air trapping or pneumonitis. 3. Cardiomegaly with coronary artery calcifications. 4. Aortic atherosclerosis. Electronically Signed   By: Leita Birmingham M.D.   On: 03/18/2024 15:42   ECHOCARDIOGRAM COMPLETE Result Date: 03/18/2024    ECHOCARDIOGRAM REPORT   Patient Name:   KELLAN BOEHLKE Date of Exam: 03/18/2024  Medical Rec #:  [id number]     Height:       65.0 in Accession #:    [id number]    Weight:       184.1 lb Date of Birth:  1946/02/05    BSA:          1.910 m Patient Age:    77 years      BP:           140/57 mmHg Patient Gender: F             HR:           119 bpm. Exam Location:  [facility] Procedure: 2D Echo, Cardiac Doppler and Color Doppler (Both Spectral and Color            Flow Doppler were utilized during procedure). Indications:     Stroke I63.9  History:         Patient has no prior history  of Echocardiogram examinations.                  Risk Factors:Hypertension.  Sonographer:     Bari Roar Referring Phys:  [id number] DELAYNE LULLA SOLIAN Diagnosing Phys: Denyse Bathe  Sonographer Comments: Technically difficult study due to poor echo windows and patient is obese. Image acquisition challenging due to patient body habitus and Image acquisition challenging due to respiratory motion. IMPRESSIONS  1. Left ventricular ejection fraction, by estimation, is 60 to 65%. The left ventricle has normal function. The left ventricle has no regional wall motion abnormalities. Left ventricular diastolic parameters are consistent with Grade III diastolic dysfunction (restrictive).  2. Right ventricular systolic function is normal. The right ventricular size is normal.  3. Left atrial size was mildly dilated.  4. Right atrial size was mildly dilated.  5. The mitral valve is normal in structure. Trivial mitral valve regurgitation. No evidence of mitral stenosis.  6. The aortic valve is calcified. Aortic valve regurgitation is trivial. Severe aortic valve stenosis.  7. The inferior vena cava is normal in size with greater than 50% respiratory variability, suggesting right atrial pressure of 3 mmHg. FINDINGS  Left Ventricle: Left ventricular ejection fraction, by estimation, is 60 to 65%. The left ventricle has normal function. The left ventricle has no regional wall motion abnormalities. Strain was performed and the global longitudinal  strain is indeterminate. The left ventricular internal cavity size was normal in size. There is no left ventricular hypertrophy. Left ventricular diastolic parameters are consistent with Grade III diastolic dysfunction (restrictive). Right Ventricle: The right ventricular size is normal. No increase in right ventricular wall thickness. Right ventricular systolic function is normal. Left Atrium: Left atrial size was mildly dilated. Right Atrium: Right atrial size was mildly dilated. Pericardium: There is no evidence of pericardial effusion. Mitral Valve: The mitral valve is normal in structure. Trivial mitral valve regurgitation. No evidence of mitral valve stenosis. Tricuspid Valve: The tricuspid valve is normal in structure. Tricuspid valve regurgitation is trivial. No evidence of tricuspid stenosis. Aortic Valve: The aortic valve is calcified. Aortic valve regurgitation is trivial. Severe aortic stenosis is present. Aortic valve mean gradient measures 32.5 mmHg. Aortic valve peak gradient measures 57.3 mmHg. Aortic valve area, by VTI measures 0.53 cm. Pulmonic Valve: The pulmonic valve was normal in structure. Pulmonic valve regurgitation is not visualized. No evidence of pulmonic stenosis. Aorta: The aortic root is normal in size and structure. Venous: The inferior vena cava is normal in size with greater than 50% respiratory variability, suggesting right atrial pressure of 3 mmHg. IAS/Shunts: No atrial level shunt detected by color flow Doppler. Additional Comments: 3D was performed not requiring image post processing on an independent workstation and was indeterminate.  LEFT VENTRICLE PLAX 2D LVIDd:         3.60 cm   Diastology LVIDs:         2.40 cm   LV e' medial:    7.15 cm/s LV PW:         1.20 cm   LV E/e' medial:  24.2 LV IVS:        1.70 cm   LV e' lateral:   7.93 cm/s LVOT diam:     1.70 cm   LV E/e' lateral: 21.8 LV SV:         32 LV SV Index:   17 LVOT Area:     2.27 cm  RIGHT VENTRICLE RV S prime:      10.70 cm/s LEFT  ATRIUM           Index LA diam:      3.20 cm 1.68 cm/m LA Vol (A4C): 33.6 ml 17.60 ml/m  AORTIC VALVE                     PULMONIC VALVE AV Area (Vmax):    0.76 cm      PV Vmax:        1.56 m/s AV Area (Vmean):   0.62 cm      PV Peak grad:   9.7 mmHg AV Area (VTI):     0.53 cm      RVOT Peak grad: 6 mmHg AV Vmax:           378.50 cm/s AV Vmean:          258.000 cm/s AV VTI:            0.591 m AV Peak Grad:      57.3 mmHg AV Mean Grad:      32.5 mmHg LVOT Vmax:         127.00 cm/s LVOT Vmean:        70.400 cm/s LVOT VTI:          0.139 m LVOT/AV VTI ratio: 0.24  AORTA Ao Root diam: 2.30 cm Ao Asc diam:  2.80 cm MITRAL VALVE                TRICUSPID VALVE MV Area (PHT): 7.59 cm     TR Peak grad:   18.8 mmHg MV Decel Time: 100 msec     TR Vmax:        217.00 cm/s MV E velocity: 173.00 cm/s MV A Prime:    13.7 cm/s    SHUNTS                             Systemic VTI:  0.14 m                             Systemic Diam: 1.70 cm Denyse Bathe Electronically signed by Denyse Bathe Signature Date/Time: 03/18/2024/2:42:42 PM    Final    MR BRAIN WO CONTRAST Result Date: 03/18/2024 EXAM: MRI BRAIN WITHOUT CONTRAST 03/18/2024 12:53:00 PM TECHNIQUE: Multiplanar multisequence MRI of the head/brain was performed without the administration of intravenous contrast. COMPARISON: CT head and CTA head and neck 03/17/2024. Brain MRI 07/17/2023. CLINICAL HISTORY: 78 year old female with acute neuro deficit, stroke suspected. FINDINGS: BRAIN: Confluent restricted diffusion throughout much of the left ACA territory (series 5 image 36, series 7 image 28) with T2 and FLAIR hyperintense cytotoxic edema. Similar intense restricted diffusion also in the anterior and inferior caudate nucleus, corresponding to head CT finding yesterday. Caudate is mildly expanded by cytotoxic edema. No hemorrhagic transformation. There is also subtle contralateral proximal right ACA territory infarct with restricted diffusion and  cytotoxic edema affecting the right inferior frontal gyrus (series 5 image 24, series 9 image 24). And Faint right superior frontal gyrus white matter subcentimeter restricted diffusion (series 5 image 39) is noted in the right MCA / ACA watershed territory. Chronic periventricular and scattered cerebral white matter T2 and FLAIR hyperintensity is moderate for age and stable outside of the acute findings. Minimal chronic microhemorrhage suspected in the brain such as left occipital lobe series 12 image 27. Chronic lacunar infarct in the ventral right thalamus. No significant intracranial mass  effect. No midline shift. No hydrocephalus. The sella is unremarkable. Normal flow voids. Mediovascular phleboliths at the skull base are preserved. No other diffusion restriction. Brainstem and cerebellum are stable. ORBITS: No acute abnormality. SINUSES AND MASTOIDS: Stable mild left mastoid effusion. Negative visible nasopharynx. BONES AND SOFT TISSUES: Hyperostosis of the calvarium, normal variant. Normal marrow signal. No acute soft tissue abnormality. Negative visible cervical spine. IMPRESSION: 1. Confluent Left ACA territory acute infarct, including involvement of the inferomedial left caudate nucleus. Additionally, small area of acute infarct in the proximal right ACA territory, and several sub centimeter foci of acute ischemia in the distal right MCA/ACA watershed. 2. Cytotoxic edema with no hemorrhagic transformation or significant mass effect. 3. Elsewhere stable chronic small vessel disease. Electronically signed by: Helayne Hurst MD 03/18/2024 01:04 PM EST RP Workstation: [id number]   DG Chest Port 1 View Result Date: 03/18/2024 EXAM: 1 VIEW(S) XRAY OF THE CHEST 03/18/2024 10:29:00 AM COMPARISON: Radiographs from 07/17/2023. CTA head and neck from 03/17/2024. Radiographs from 03/18/2024 (earlier today). CLINICAL HISTORY: 78 year old female with pneumonia. FINDINGS: LUNGS AND PLEURA: Low lung volumes. Stable  linear scarring or subsegmental atelectasis at left base. The right suprahilar opacity appears chronic but increased when compared to radiographs from 07/17/2023. Appearance unchanged from earlier today. CTA neck yesterday negative aside from gas trapping. No pleural effusion. No pneumothorax. HEART AND MEDIASTINUM: Aortic atherosclerosis. No acute abnormality of the cardiac and mediastinal silhouettes. BONES AND SOFT TISSUES: No acute osseous abnormality. IMPRESSION: 1. Streaky right suprahilar opacity persists, CTA neck yesterday negative aside from gas trapping. Favor atelectasis rather than developing infection. 2. No new cardiopulmonary abnormality. Electronically signed by: Helayne Hurst MD 03/18/2024 10:37 AM EST RP Workstation: [id number]   DG Chest Port 1 View Result Date: 03/18/2024 EXAM: 1 VIEW(S) XRAY OF THE CHEST 03/18/2024 01:45:55 AM COMPARISON: 07/17/2023 CLINICAL HISTORY: Shortness of breath. FINDINGS: LIMITATIONS/ARTIFACTS: Shallow inspiration. LUNGS AND PLEURA: Linear atelectasis in the left base and right mid lung. No focal consolidation. No pleural effusion. No pneumothorax. HEART AND MEDIASTINUM: Calcification of the aorta. BONES AND SOFT TISSUES: Degenerative changes in the spine and shoulders. IMPRESSION: 1. Linear atelectasis in the left base and right mid lung, which may contribute to shortness of breath. 2. No acute cardiopulmonary process identified. Electronically signed by: Elsie Gravely MD 03/18/2024 01:52 AM EST RP Workstation: [id number]   CT ANGIO HEAD NECK W WO CM Result Date: 03/17/2024 EXAM: CTA Head and Neck with Intravenous Contrast. CT Head without Contrast. CLINICAL HISTORY: Neuro deficit, acute, stroke suspected Neuro deficit, acute, stroke suspected TECHNIQUE: Axial CTA images of the head and neck performed with intravenous contrast. MIP reconstructed images were created and reviewed. Axial computed tomography images of the head/brain performed without intravenous  contrast. Note: Per PQRS, the description of internal carotid artery percent stenosis, including 0 percent or normal exam, is based on [employer] Symptomatic Carotid Endarterectomy Trial (NASCET) criteria. Dose reduction technique was used including one or more of the following: automated exposure control, adjustment of mA and kV according to patient size, and/or iterative reconstruction. CONTRAST: With; COMPARISON: CT head November 09, 2023 FINDINGS: CT HEAD: BRAIN: Limited CT head with suggestion of hypodensity in the left basal ganglia (series 2 image 14). No acute intraparenchymal hemorrhage. No mass lesion. No midline shift or extra-axial collection. VENTRICLES: No hydrocephalus. ORBITS: The orbits are unremarkable. SINUSES AND MASTOIDS: The paranasal sinuses and mastoid air cells are clear. CTA NECK: COMMON CAROTID ARTERIES: No significant stenosis. No dissection or occlusion. INTERNAL CAROTID ARTERIES: No stenosis  by NASCET criteria. No dissection or occlusion. VERTEBRAL ARTERIES: Moderate stenosis of the left vertebral artery origin. Occlusion versus severe stenosis of the nondominant distal right intradural vertebral artery. Dominant left vertebral artery is patent without significant stenosis. CTA HEAD: ANTERIOR CEREBRAL ARTERIES: No significant stenosis. No occlusion. No aneurysm. Hypoplastic right A1 ACA. MIDDLE CEREBRAL ARTERIES: Mild to moderate left M1 and proximal M2 MCA stenoses. No occlusion. No aneurysm. POSTERIOR CEREBRAL ARTERIES: Severe bilateral P2 PCA stenoses. No occlusion. No aneurysm. BASILAR ARTERY: No significant stenosis. No occlusion. No aneurysm. OTHER: SOFT TISSUES: No acute finding. No masses or lymphadenopathy. BONES: No acute osseous abnormality. IMPRESSION: 1. Limited CT head with hypodensity in the left basal ganglia that is suspicious for acute/recent infarct. Recommend MRI to assess for acute infarct. 2. Occluded versus severely stenotic distal right nondominant vertebral  artery. 3. Severe bilateral P2 PCA stenoses. 4. Moderate left vertebral artery origin stenosis. 5. Mild to moderate left M1 and M2 MCA stenoses. Electronically signed by: Gilmore Molt MD 03/17/2024 09:54 PM EST RP Workstation: [id number]   CT ABDOMEN PELVIS W CONTRAST Result Date: 03/17/2024 EXAM: CT ABDOMEN AND PELVIS WITH CONTRAST 03/17/2024 05:47:17 PM TECHNIQUE: CT of the abdomen and pelvis was performed with the administration of intravenous contrast. 100 mL of iohexol  (OMNIPAQUE ) 300 MG/ML solution was administered. Multiplanar reformatted images are provided for review. Automated exposure control, iterative reconstruction, and/or weight-based adjustment of the mA/kV was utilized to reduce the radiation dose to as low as reasonably achievable. COMPARISON: None available. CLINICAL HISTORY: LLQ abdominal pain. FINDINGS: LOWER CHEST: Treatment artifact in the lung bases. 4 mm nodule in the right costophrenic angle. LIVER: Fatty infiltration of the liver. GALLBLADDER AND BILE DUCTS: Gallbladder is unremarkable. No biliary ductal dilatation. SPLEEN: Circumscribed low attenuation lesion in the spleen measuring 3 cm diameter. This is likely a cyst or hemangioma. PANCREAS: No acute abnormality. ADRENAL GLANDS: No acute abnormality. KIDNEYS, URETERS AND BLADDER: Bilateral renal cysts. No stones in the kidneys or ureters. No hydronephrosis. No perinephric or periureteral stranding. Urinary bladder is unremarkable. Per consensus, no follow-up is needed for simple Bosniak type 1 and 2 renal cysts, unless the patient has a malignancy history or risk factors. GI AND BOWEL: Stomach demonstrates no acute abnormality. Diverticulosis of the sigmoid colon. No evidence of acute diverticulitis. There is no bowel obstruction. PERITONEUM AND RETROPERITONEUM: No ascites. No free air. VASCULATURE: Calcification of the aorta. No aneurysm. Aorta is normal in caliber. LYMPH NODES: No lymphadenopathy. REPRODUCTIVE ORGANS: No acute  abnormality. BONES AND SOFT TISSUES: Previous left hip arthroplasty. Degenerative changes in the spine. Degenerative changes in the right hip. Port. No acute osseous abnormality. No focal soft tissue abnormality. IMPRESSION: 1. No acute findings. 2. 4 mm right costophrenic angle pulmonary nodule; given incomplete chest coverage, per Fleischner Society Guidelines for incidental nodules 5 mm, no routine follow-up is recommended for low-risk or unknown-risk patients without high-risk nodule features; if high-risk for malignancy or if high-risk nodule features are present, an optional non-contrast chest CT at 12 months may be considered; if performed and stable at 12 months, no further follow-up is needed. Electronically signed by: Elsie Gravely MD 03/17/2024 05:58 PM EST RP Workstation: [id number]    Labs:  CBC: Recent Labs    03/19/24 0404 03/20/24 0600 03/21/24 0452 03/22/24 0426  WBC 26.5* 17.3* 13.1* 9.9  HGB 13.6 13.2 12.4 12.5  HCT 42.6 41.5 39.1 39.5  PLT 309 275 248 238    COAGS: No results for input(s): INR, APTT in the last  8760 hours.  BMP: Recent Labs    03/21/24 0452 03/22/24 0426 03/23/24 0940 03/24/24 0457  NA 146* 147* 145 150*  K 4.0 4.5 4.8 4.7  CL 110 111 110 111  CO2 28 26 28  32  GLUCOSE 153* 150* 196* 112*  BUN 19 17 17 17   CALCIUM  9.0 9.0 8.9 9.3  CREATININE 0.88 0.65 0.65 0.67  GFRNONAA >60 >60 >60 >60    LIVER FUNCTION TESTS: Recent Labs    07/17/23 1559 03/17/24 1624 03/19/24 0404 03/20/24 0600 03/22/24 0426 03/23/24 0940 03/24/24 0457  BILITOT 0.9 0.7 0.7  --   --   --   --   AST 19 22 49*  --   --   --   --   ALT 16 12 29   --   --   --   --   ALKPHOS 80 117 108  --   --   --   --   PROT 7.9 8.3* 7.3  --   --   --   --   ALBUMIN 4.0 4.5 4.0 3.8 3.3* 3.1* 3.2*    TUMOR MARKERS: No results for input(s): AFPTM, CEA, CA199, CHROMGRNA in the last 8760 hours.  Assessment and Plan: Patient was initially seen in ED on 11/21  for poor oral intake and speech difficulties. Patient was admitted for CVA, and became less responsive shortly after admission. Currently patient is minimally responsive to noxious stimuli, requiring intermittent BiPAP. IR was consulted for and placed Coretrack tube on 11/24, and patient is receiving tube feeds.  Hospice care is consulted as well, though family has requested full life-preserving care. Patient is DNR at this time. Gastrostomy tube placement request was reviewed and approved by Dr. Karalee.   Patient is tentatively scheduled for same in IR on 12/3, due to Plavix  5-day washout delay.  DNR/DNI is rescinded during procedure. Patient will be administered by IR staff Vancomycin 2 mg for surgical prophylactic prophylaxis at time of procedure. Order placed. Per Neurology service (Dr. Michaela), ASA 81 mg will need to be maintained through procedure, but Plavix  may be held for 5 day, until 12/3. Neurology and Hospitalist are also amenable to holding Lovenox  for no less than 24 hours prior to procedure. Both are currently appropriately held. Patient will have her tube feeds discontinued on 12/3 at midnight in anticipation of G-tube placement. All remaining labs and medications are within acceptable parameters.  Allergies reviewed: Lansoprazole; Penicillins; Prozac; Bupropion.  Risks and benefits image guided gastrostomy tube placement was discussed with the patient including, but not limited to the need for a barium enema during the procedure, bleeding, infection, peritonitis and/or damage to adjacent structures.  All  were answered, and there is agreement to proceed.  Consent signed and in chart.    Thank you for allowing our service to participate in AVANNI TURNBAUGH 's care.  Electronically Signed: Carlin DELENA Griffon, PA-C   03/24/2024, 10:51 AM      I spent a total of 40 Minutes in face to face in clinical consultation, greater than 50% of which was counseling/coordinating care  for dysphagia and malnutrition, with consideration for G-tube placement.

## 2024-03-24 NOTE — Progress Notes (Signed)
 RN made Dr. Caleen aware that patient has dobbhoff and from chart it seems it was placed on 11/24 under fluro. the stilette was still in place this morning and meds have been given and tube feeds. Tube feeds were stopped last night for concern of aspiration and coughing. Dr. Caleen acknowledged and stated that if having trouble with tube then we can contact IR. Dobbhoff tube flushes well now that stillette has been removed. This RN removed stillette per protocol.

## 2024-03-24 NOTE — Progress Notes (Signed)
 Progress Note    Marie Pham  FMW:[id number] DOB: [birth year]  DOA: 03/17/2024 PCP: Tobie Domino, MD      Brief Narrative:    Medical records reviewed and are as summarized below:  Marie Pham is a 78 y.o. female with medical history significant for hypertension, chronic back pain, arthritis, sleep apnea, depression, asthma/bronchitis, ambulates with a walker at baseline who presented to the hospital because of poor oral intake and speech difficulties.  Family had tried to communicate with her and she appeared to understand what her son was saying but she could not respond.  Onset or duration of speech difficulty is not clear.  In the ED, apparently patient indicated that her back hurts.  Vitals in the ED: Temperature 98.7 F, respiratory rate 18, pulse 115, BP 120/99 oxygen saturation 95% on room air.  Troponins negative, mildly elevated D-dimer 0.92. EKG shows sinus tachycardia, right bundle branch block, prolonged QTc at 494  CT angiogram of head and neck showed acute left basal ganglia infarct.  Chest x-ray 1. Linear atelectasis in the left base and right mid lung, which may contribute to shortness of breath. 2. No acute cardiopulmonary process identified.  She was admitted to the hospital for acute stroke.  11/26: Patient very somnolent and difficult to arouse, EEG was also ordered.  Family would like to continue full scope of care, might need a PEG tube placement.  Currently being fed with corTrack.  11/27: No change in mentation, remained minimally responsive to obnoxious stimuli.  Repeat ABG with mild hypercarbia at 49 which does not explain this encephalopathy, ammonia mildly elevated at 37, we will give her a trial of bicarb, also adding lactulose .  Repeating brain MRI and neurology was reconsulted.  1 episode of A-fib was mentioned on her monitor on 11/23, talked with central monitoring system and it was PACs, she never had any documented atrial  fibrillation.  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  IR was consulted for PEG tube placement and Plavix  need to be held for 5 days, most likely will get her PEG on 03/29/2024.  TOC to work on placement.  Daughter works in a facility in [employer] and trying to get her there. Family would like to continue current level of care to see if there is any improvement in her mental status over the next few months before thinking about transitioning to hospice.  Neurology is not very hopeful.  Only time will tell.  Repeat MRI yesterday with progressed bilateral ACA territory infarct with cytotoxic edema, left greater than right, no hemorrhagic transformation or mass effect.  Also noted evolution of left caudate infarct with possible petechial hemorrhage but no malignant hemorrhagic transformation or mass effect.  No new vascular territory ischemia.  Developed diarrhea so holding MiraLAX  and lactulose .   Assessment/Plan:   Principal Problem:   Acute CVA (cerebrovascular accident) (HCC) Active Problems:   AKI (acute kidney injury)   Hypernatremia   SIRS (systemic inflammatory response syndrome) (HCC)   Abnormal EKG   Essential hypertension   Severe aortic stenosis   Pneumonitis   Body mass index is 31.95 kg/m.  (Class I obesity)  Acute stroke with lethargy, right facial droop, right hemiparesis and aphasia: Persistent encephalopathy Patient with bilateral frontal lobe and basal ganglia infarct. 2D echo showed EF estimated at 60 to 65%, grade 3 diastolic dysfunction, severe aortic valve stenosis. Continue low-dose aspirin  and Lipitor  via NG tube Holding Plavix  for 5 days,  need PEG tube placed on 03/29/2024 PT and OT recommended discharge to SNF. Patient remained unresponsive S/p NG tube placement by IR on 03/20/2024 Continue enteral nutrition via NG tube  EEG with generalized cortical dysfunction, no epileptiform discharges. ABG with mild hypercarbia  and mildly elevated ammonia. - Repeat brain MRI with progress bilateral ACA territory infarct, cytogenic edema with no mass effect or hemorrhagic transformation, please see the full report. - Patient is not a candidate for BiPAP due to unable to protect airway, barely responding to noxious stimuli with a small flexion.  SIRS, tachycardia, tachypnea, leukocytosis, pneumonitis:  Leukocytosis resolved Repeat chest x-ray on 03/21/2024 because rapid response was called in the early hours of the morning for tachypnea.  Chest x-ray showed low lung volumes with bronchovascular crowding but there was no focal consolidation.  CTA chest on11/22/2025 did not show any evidence of pulmonary embolism but there is evidence of bilateral pneumonitis.  This is probably due to aspiration.   - Completed a 5-day course of antibiotics with ceftriaxone  and Zithromax   Severe aortic stenosis, grade 3 diastolic dysfunction: Will need outpatient follow-up with cardiologist.  Hypernatremia, dehydration: Improved.  Sodium down from 153-146.>147.145>>150 - Tube feed was held overnight for concern of aspiration and unable to flush tube, it was resumed this morning at a slow rate - Continue with free water  AKI ruled out.  Hypertension: Blood pressure within goal  Continue carvedilol .  Chronic back pain/lumbar radiculitis - Pain management   Plan of care was discussed with [name] (son), they want to keep her full scope of care to see the trajectory. They think that care was delayed in the ED and patient could have been a candidate for tPA.  They said they have read a lot about strokes. I explained that it was documented in the chart that patient was last known normal over 24 hours, and that given tPA beyond the recommended timeframe could potentially have serious complications but with little benefit.   Diet Order             Diet NPO time specified  Diet effective midnight           Diet NPO time specified  Diet  effective now                  Consultants: Neurologist  Procedures: NG tube placement by IR on 03/20/2024  Medications:    vitamin C  500 mg Per Tube BID   aspirin   81 mg Per NG tube Daily   atorvastatin   80 mg Per NG tube Daily   carvedilol   12.5 mg Per NG tube BID WC   cyanocobalamin   1,000 mcg Intramuscular Q0600   enoxaparin  (LOVENOX ) injection  40 mg Subcutaneous Q24H   feeding supplement (PROSource TF20)  60 mL Per Tube Daily   free water   200 mL Per Tube Q6H   lactulose   30 g Per Tube BID   latanoprost  1 drop Both Eyes QHS   losartan   25 mg Per Tube Daily   multivitamin with minerals  1 tablet Per Tube Daily   mouth rinse  15 mL Mouth Rinse 4 times per day   thiamine   100 mg Per Tube Daily   zinc sulfate (50mg  elemental zinc)  220 mg Per Tube Daily   Continuous Infusions:  feeding supplement (OSMOLITE 1.5 CAL) 30 mL/hr at 03/24/24 1400     Anti-infectives (From admission, onward)    Start     Dose/Rate Route Frequency Ordered Stop  03/19/24 1630  azithromycin  (ZITHROMAX ) 500 mg in sodium chloride  0.9 % 250 mL IVPB  Status:  Discontinued        500 mg 250 mL/hr over 60 Minutes Intravenous Every 24 hours 03/19/24 1533 03/22/24 1542   03/18/24 1045  cefTRIAXone  (ROCEPHIN ) 1 g in sodium chloride  0.9 % 100 mL IVPB  Status:  Discontinued        1 g 200 mL/hr over 30 Minutes Intravenous Every 24 hours 03/18/24 0945 03/22/24 1542       Family Communication/Anticipated D/C date and plan/Code Status   DVT prophylaxis: enoxaparin  (LOVENOX ) injection 40 mg Start: 03/18/24 1038     Code Status: Do not attempt resuscitation (DNR) PRE-ARREST INTERVENTIONS DESIRED  Family Communication: Discussed with daughter at bedside  Disposition Plan: Plan to discharge to SNF   Status is: Inpatient Remains inpatient appropriate because: Acute stroke and persistent encephalopathy   Subjective:  Patient remained pretty much unresponsive, no change in mental status.   Nursing concern of some aspiration and unable to flush feeding tube overnight so it was held, it was functioning this morning, KUB with tube in stomach.  Objective:    Vitals:   03/24/24 1100 03/24/24 1153 03/24/24 1200 03/24/24 1300  BP: (!) 136/51  (!) 130/49 (!) 138/58  Pulse:   89 92  Resp: (!) 37  (!) 29 (!) 33  Temp:  97.9 F (36.6 C)    TempSrc:  Axillary    SpO2:   97% 98%  Weight:      Height:       No data found.   Intake/Output Summary (Last 24 hours) at 03/24/2024 1454 Last data filed at 03/24/2024 1400 Gross per 24 hour  Intake 2361.5 ml  Output --  Net 2361.5 ml   Filed Weights   03/22/24 0500 03/23/24 0500 03/24/24 0449  Weight: 86.5 kg 87.4 kg 87.1 kg    Exam: General.  Ill-appearing, unresponsive elderly lady, in no acute distress. Pulmonary.  Lungs clear bilaterally, normal respiratory effort. CV.  Regular rate and rhythm, no JVD, rub or murmur. Abdomen.  Soft, nontender, nondistended, BS positive. CNS.  Unresponsive, barely exhibit mild flexion on noxious stimuli Extremities.  No edema,  pulses intact and symmetrical.  Data Reviewed:   I have personally reviewed following labs and imaging studies:  Labs: Labs show the following:   Basic Metabolic Panel: Recent Labs  Lab 03/19/24 0404 03/20/24 0600 03/21/24 0452 03/22/24 0426 03/23/24 0940 03/24/24 0457  NA 150* 153* 146* 147* 145 150*  K 4.0 3.9 4.0 4.5 4.8 4.7  CL 113* 117* 110 111 110 111  CO2 25 25 28 26 28  32  GLUCOSE 100* 96 153* 150* 196* 112*  BUN 18 18 19 17 17 17   CREATININE 0.92 0.83 0.88 0.65 0.65 0.67  CALCIUM  9.2 9.2 9.0 9.0 8.9 9.3  MG 2.5* 2.5* 2.4 2.5*  --   --   PHOS 4.0 3.5 3.4 3.6 3.5 4.1   GFR Estimated Creatinine Clearance: 66.2 mL/min (by C-G formula based on SCr of 0.67 mg/dL). Liver Function Tests: Recent Labs  Lab 03/17/24 1624 03/19/24 0404 03/20/24 0600 03/22/24 0426 03/23/24 0940 03/24/24 0457  AST 22 49*  --   --   --   --   ALT 12 29  --    --   --   --   ALKPHOS 117 108  --   --   --   --   BILITOT 0.7 0.7  --   --   --   --  PROT 8.3* 7.3  --   --   --   --   ALBUMIN 4.5 4.0 3.8 3.3* 3.1* 3.2*   Recent Labs  Lab 03/17/24 1624  LIPASE 42   Recent Labs  Lab 03/23/24 1028  AMMONIA 37*   Coagulation profile No results for input(s): INR, PROTIME in the last 168 hours.  CBC: Recent Labs  Lab 03/17/24 1624 03/18/24 1139 03/19/24 0404 03/20/24 0600 03/21/24 0452 03/22/24 0426  WBC 9.4 15.7* 26.5* 17.3* 13.1* 9.9  NEUTROABS 5.8  --   --   --   --   --   HGB 15.4* 14.2 13.6 13.2 12.4 12.5  HCT 47.4* 44.9 42.6 41.5 39.1 39.5  MCV 88.3 91.1 90.8 90.4 90.1 89.6  PLT 363 300 309 275 248 238   Cardiac Enzymes: No results for input(s): CKTOTAL, CKMB, CKMBINDEX, TROPONINI in the last 168 hours. BNP (last 3 results) No results for input(s): PROBNP in the last 8760 hours. CBG: Recent Labs  Lab 03/23/24 2020 03/24/24 0046 03/24/24 0406 03/24/24 0808 03/24/24 1143  GLUCAP 218* 110* 114* 103* 124*   D-Dimer: No results for input(s): DDIMER in the last 72 hours.  Hgb A1c: No results for input(s): HGBA1C in the last 72 hours.  Lipid Profile: No results for input(s): CHOL, HDL, LDLCALC, TRIG, CHOLHDL, LDLDIRECT in the last 72 hours.  Thyroid  function studies: Recent Labs    03/23/24 1028  TSH 2.260   Anemia work up: Recent Labs    03/23/24 1028  VITAMINB12 165*   Sepsis Labs: Recent Labs  Lab 03/19/24 0404 03/20/24 0600 03/21/24 0452 03/22/24 0426  WBC 26.5* 17.3* 13.1* 9.9    Microbiology No results found for this or any previous visit (from the past 240 hours).  Procedures and diagnostic studies:  DG Abd 1 View Result Date: 03/23/2024 EXAM: 1 VIEW XRAY OF THE ABDOMEN 03/23/2024 05:54:51 PM COMPARISON: Comparison with 03/20/2024. CLINICAL HISTORY: [id number] Encounter for care related to feeding tube [id number] Encounter for care related to feeding tube FINDINGS:  LIMITATIONS/ARTIFACTS: Limited field of view for tube placement verification purposes. LINES, TUBES AND DEVICES: An enteric tube is present with the tip projecting over the upper mid-abdomen, consistent with a location in the body of the stomach. BOWEL: Nonobstructive bowel gas pattern. SOFT TISSUES: No opaque urinary calculi. BONES: No acute osseous abnormality. IMPRESSION: 1. Enteric tube tip projects over the upper mid-abdomen, consistent with a location in the body of the stomach. Electronically signed by: Elsie Gravely MD 03/23/2024 06:19 PM EST RP Workstation: [id number]   MR BRAIN WO CONTRAST Result Date: 03/23/2024 EXAM: MRI BRAIN WITHOUT CONTRAST 03/23/2024 04:04:32 PM TECHNIQUE: Multiplanar multisequence MRI of the head/brain was performed without the administration of intravenous contrast. COMPARISON: Brain MRI 03/18/2024, and earlier exams. CLINICAL HISTORY: 78 year old female with recent bilateral ACA territory infarcts, left greater than right. FINDINGS: BRAIN AND VENTRICLES: Progressed bilaterally ACA territory infarcts and cytotoxic edema, left greater than right. No hemorrhagic transformation. No new areas of acute infarction. New intrinsic T1 hyperintensity within the left caudate, possibly petechial blood. No other acute intracranial hemorrhage. No intracranial mass effect or midline shift. No hydrocephalus. The sella is unremarkable. Visible major vascular flow voids remain stable, including probable maintained patency at the ACA origins. Gyral enlargement more pronounced in the left ACA territory, and confluent involvement of the left half of the corpus callosum as seen on series 17 image 19. Elnor and white matter signal elsewhere is stable including bilateral confluent but nonspecific periventricular white matter  T2 and FLAIR hyperintensity. The left caudate nucleus which appeared edematous and enlarged previously now appears virtually normal (series 8 images 14 and 15). However, there is  new intrinsic T1 hyperintensity within the left caudate as seen on series 14 image 84. This might be petechial blood. No malignant hemorrhagic transformation or mass effect. Chronic lacunar infarct at the ventral right thalamus again noted. No convincing involvement of other vascular territories. Chronic small vessel disease. ORBITS: Orbit detail limited today by susceptibility artifact. SINUSES AND MASTOIDS: Paranasal sinus detail limited today by susceptibility artifact. Mild increased bilateral mastoid air cell effusions. BONES AND SOFT TISSUES: Metal susceptibility artifact associated with the face, ethmoid sinuses. Negative visible cervical spine. IMPRESSION: 1. Progressed bilateral ACA territory infarcts with cytotoxic edema, left greater than right. No hemorrhagic transformation. No significant mass effect. 2. Evolution of left caudate infarct with possible petechial hemorrhage, but no malignant hemorrhagic transformation or mass effect. 3. No new vascular territory ischemia. Chronic small vessel disease. Electronically signed by: Helayne Hurst MD 03/23/2024 05:35 PM EST RP Workstation: [id number]   EEG adult Result Date: 03/22/2024 Michaela Aisha SQUIBB, MD     03/23/2024 10:07 AM History: 78 yo F with CVA and ams, EEG to evaluated for seizures EEG Duration: 30 minutes Sedation: none Patient State: Awake and drowsy Technique: This EEG was acquired with electrodes placed according to the International 10-20 electrode system (including Fp1, Fp2, F3, F4, C3, C4, P3, P4, O1, O2, T3, T4, T5, T6, A1, A2, Fz, Cz, Pz). The following electrodes were missing or displaced: none. Background: The background somewhat disorganized, with pronounced irregular theta and delta range activities.  There is a posterior dominant rhythm of 8 to 8.5 Hz that is well-sustained.  There are occasional bifrontally predominant discharges with a shifting lateral predominance with triphasic morphology. Photic stimulation: Physiologic  driving is not performed EEG Abnormalities: 1) triphasic waves 2) generalized irregular slow activity Clinical Interpretation: This EEG is consistent with a generalized nonspecific cerebral dysfunction, though triphasic waves are nonspecific and they can be associated with toxic/metabolic etiologies of encephalopathy. There was no seizure or seizure predisposition recorded on this study. Please note that lack of epileptiform activity on EEG does not preclude the possibility of epilepsy. Aisha Michaela, MD [facility] If 7pm- 7am, please page neurology on call as listed in AMION.  Time spent.  50 minutes  This record has been created using Conservation officer, historic buildings. Errors have been sought and corrected,but may not always be located. Such creation errors do not reflect on the standard of care.    LOS: 6 days   Amaryllis Dare  [facility]   Pager on [email or web address]. If 7PM-7AM, please contact night-coverage at [email or web address]  03/24/2024, 2:54 PM

## 2024-03-25 DIAGNOSIS — R6510 Systemic inflammatory response syndrome (SIRS) of non-infectious origin without acute organ dysfunction: Secondary | ICD-10-CM | POA: Diagnosis not present

## 2024-03-25 DIAGNOSIS — Z7189 Other specified counseling: Secondary | ICD-10-CM | POA: Diagnosis not present

## 2024-03-25 DIAGNOSIS — R4189 Other symptoms and signs involving cognitive functions and awareness: Secondary | ICD-10-CM

## 2024-03-25 DIAGNOSIS — E870 Hyperosmolality and hypernatremia: Secondary | ICD-10-CM | POA: Diagnosis not present

## 2024-03-25 DIAGNOSIS — N179 Acute kidney failure, unspecified: Secondary | ICD-10-CM | POA: Diagnosis not present

## 2024-03-25 DIAGNOSIS — Z515 Encounter for palliative care: Secondary | ICD-10-CM | POA: Diagnosis not present

## 2024-03-25 DIAGNOSIS — I639 Cerebral infarction, unspecified: Secondary | ICD-10-CM | POA: Diagnosis not present

## 2024-03-25 LAB — GLUCOSE, CAPILLARY
Glucose-Capillary: 185 mg/dL — ABNORMAL HIGH (ref 70–99)
Glucose-Capillary: 177 mg/dL — ABNORMAL HIGH (ref 70–99)
Glucose-Capillary: 148 mg/dL — ABNORMAL HIGH (ref 70–99)
Glucose-Capillary: 131 mg/dL — ABNORMAL HIGH (ref 70–99)
Glucose-Capillary: 213 mg/dL — ABNORMAL HIGH (ref 70–99)
Glucose-Capillary: 182 mg/dL — ABNORMAL HIGH (ref 70–99)

## 2024-03-25 LAB — RENAL FUNCTION PANEL
Albumin: 3.2 g/dL — ABNORMAL LOW (ref 3.5–5.0)
Anion gap: 6 (ref 5–15)
BUN: 25 mg/dL — ABNORMAL HIGH (ref 8–23)
CO2: 34 mmol/L — ABNORMAL HIGH (ref 22–32)
Calcium: 9.2 mg/dL (ref 8.9–10.3)
Chloride: 110 mmol/L (ref 98–111)
Creatinine, Ser: 0.81 mg/dL (ref 0.44–1.00)
GFR, Estimated: >60 mL/min (ref >60)
Glucose, Bld: 161 mg/dL — ABNORMAL HIGH (ref 70–99)
Phosphorus: 4.1 mg/dL (ref 2.5–4.6)
Potassium: 4.7 mmol/L (ref 3.5–5.1)
Sodium: 150 mmol/L — ABNORMAL HIGH (ref 135–145)

## 2024-03-25 MED ORDER — SODIUM CHLORIDE 0.9 % IV SOLN
1.0000 g | INTRAVENOUS | Status: DC
  Administered 2024-03-25 – 2024-03-27 (×3): 1 g via INTRAVENOUS
  Filled 2024-03-25 (×4): qty 10

## 2024-03-25 MED ORDER — METRONIDAZOLE 500 MG/100ML IV SOLN
500.0000 mg | Freq: Two times a day (BID) | INTRAVENOUS | Status: DC
  Administered 2024-03-25 – 2024-03-28 (×7): 500 mg via INTRAVENOUS
  Filled 2024-03-25 (×8): qty 100

## 2024-03-25 MED ORDER — FREE WATER
200.0000 mL | Status: DC
  Administered 2024-03-25 – 2024-03-29 (×23): 200 mL

## 2024-03-25 MED ORDER — CARMEX CLASSIC LIP BALM EX OINT
1.0000 | TOPICAL_OINTMENT | CUTANEOUS | Status: DC | PRN
  Administered 2024-03-26 – 2024-03-27 (×4): 1 via TOPICAL
  Filled 2024-03-25: qty 0.1

## 2024-03-25 NOTE — Progress Notes (Signed)
 Progress Note    Marie Pham  FMW:[id number] DOB: 01/07/1946  DOA: 03/17/2024 PCP: Marie Domino, MD      Brief Narrative:    Medical records reviewed and are as summarized below:  Marie Pham is a 78 y.o. female with medical history significant for hypertension, chronic back pain, arthritis, sleep apnea, depression, asthma/bronchitis, ambulates with a walker at baseline who presented to the hospital because of poor oral intake and speech difficulties.  Family had tried to communicate with her and she appeared to understand what her son was saying but she could not respond.  Onset or duration of speech difficulty is not clear.  In the ED, apparently patient indicated that her back hurts.  Vitals in the ED: Temperature 98.7 F, respiratory rate 18, pulse 115, BP 120/99 oxygen saturation 95% on room air.  Troponins negative, mildly elevated D-dimer 0.92. EKG shows sinus tachycardia, right bundle branch block, prolonged QTc at 494  CT angiogram of head and neck showed acute left basal ganglia infarct.  Chest x-ray 1. Linear atelectasis in the left base and right mid lung, which may contribute to shortness of breath. 2. No acute cardiopulmonary process identified.  She was admitted to the hospital for acute stroke.  11/26: Patient very somnolent and difficult to arouse, EEG was also ordered.  Family would like to continue full scope of care, might need a PEG tube placement.  Currently being fed with corTrack.  11/27: No change in mentation, remained minimally responsive to obnoxious stimuli.  Repeat ABG with mild hypercarbia at 49 which does not explain this encephalopathy, ammonia mildly elevated at 37, we will give her a trial of bicarb, also adding lactulose .  Repeating brain MRI and neurology was reconsulted.  1 episode of A-fib was mentioned on her monitor on 11/23, talked with central monitoring system and it was PACs, she never had any documented atrial  fibrillation.  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  IR was consulted for PEG tube placement and Plavix  need to be held for 5 days, most likely will get her PEG on 03/29/2024.  TOC to work on placement.  Daughter works in a facility in [employer] and trying to get her there. Family would like to continue current level of care to see if there is any improvement in her mental status over the next few months before thinking about transitioning to hospice.  Neurology is not very hopeful.  Only time will tell.  Repeat MRI yesterday with progressed bilateral ACA territory infarct with cytotoxic edema, left greater than right, no hemorrhagic transformation or mass effect.  Also noted evolution of left caudate infarct with possible petechial hemorrhage but no malignant hemorrhagic transformation or mass effect.  No new vascular territory ischemia.  Developed diarrhea so holding MiraLAX  and lactulose .  11/29: Remained unresponsive, family concern of developing pressure injuries and nurses are not changing position every 2 hourly as recommended-discussed with nursing staff.  Air mattress was also ordered.  Persistent hypernatremia at 150, increasing the frequency of free water  to Q4 hourly.  Patient developed low-grade fever overnight, had an aspiration episode on 11/27-starting on ceftriaxone  and Flagyl.  Patient with very guarded prognosis.   Assessment/Plan:   Principal Problem:   Acute CVA (cerebrovascular accident) (HCC) Active Problems:   AKI (acute kidney injury)   Hypernatremia   SIRS (systemic inflammatory response syndrome) (HCC)   Abnormal EKG   Essential hypertension   Severe aortic stenosis  Pneumonitis   Body mass index is 31.22 kg/m.  (Class I obesity)  Acute stroke with lethargy, right facial droop, right hemiparesis and aphasia: Persistent encephalopathy Patient with bilateral frontal lobe and basal ganglia infarct. 2D echo showed EF  estimated at 60 to 65%, grade 3 diastolic dysfunction, severe aortic valve stenosis. Continue low-dose aspirin  and Lipitor  via NG tube Holding Plavix  for 5 days, need PEG tube placed on 03/29/2024 PT and OT recommended discharge to SNF. Patient remained unresponsive S/p NG tube placement by IR on 03/20/2024 Continue enteral nutrition via NG tube  EEG with generalized cortical dysfunction, no epileptiform discharges. ABG with mild hypercarbia and mildly elevated ammonia. - Repeat brain MRI with progress bilateral ACA territory infarct, cytogenic edema with no mass effect or hemorrhagic transformation, please see the full report. - Patient is not a candidate for BiPAP due to unable to protect airway, barely responding to noxious stimuli.  SIRS, tachycardia, tachypnea, leukocytosis, pneumonitis:  Leukocytosis resolved Repeat chest x-ray on 03/21/2024 because rapid response was called in the early hours of the morning for tachypnea.  Chest x-ray showed low lung volumes with bronchovascular crowding but there was no focal consolidation.  CTA chest on11/22/2025 did not show any evidence of pulmonary embolism but there is evidence of bilateral pneumonitis.  This is probably due to aspiration.   - Completed a 5-day course of antibiotics with ceftriaxone  and Zithromax   11/29: Became febrile again, had another episode of aspiration on 11/27-starting on ceftriaxone  and Flagyl.  Severe aortic stenosis, grade 3 diastolic dysfunction: Will need outpatient follow-up with cardiologist.  Hypernatremia, dehydration: Improved.  Sodium down from 153-146.>147.145>>150>.150 - Increasing the frequency of free water  -Continue to monitor sodium AKI ruled out.  Hypertension: Blood pressure within goal  Continue carvedilol .  Chronic back pain/lumbar radiculitis - Pain management - Currently patient is unresponsive   Plan of care was discussed with Marie Pham (son), they want to keep her full scope of care to see  the trajectory. They think that care was delayed in the ED and patient could have been a candidate for tPA.  They said they have read a lot about strokes. I explained that it was documented in the chart that patient was last known normal over 24 hours, and that given tPA beyond the recommended timeframe could potentially have serious complications but with little benefit.   Diet Order             Diet NPO time specified  Diet effective midnight           Diet NPO time specified  Diet effective now                  Consultants: Neurologist  Procedures: NG tube placement by IR on 03/20/2024  Medications:    vitamin C  500 mg Per Tube BID   aspirin   81 mg Per NG tube Daily   atorvastatin   80 mg Per NG tube Daily   carvedilol   12.5 mg Per NG tube BID WC   cyanocobalamin   1,000 mcg Intramuscular Q0600   enoxaparin  (LOVENOX ) injection  40 mg Subcutaneous Q24H   feeding supplement (PROSource TF20)  60 mL Per Tube Daily   free water   200 mL Per Tube Q4H   latanoprost  1 drop Both Eyes QHS   losartan   25 mg Per Tube Daily   multivitamin with minerals  1 tablet Per Tube Daily   mouth rinse  15 mL Mouth Rinse 4 times per day  thiamine   100 mg Per Tube Daily   zinc sulfate (50mg  elemental zinc)  220 mg Per Tube Daily   Continuous Infusions:  cefTRIAXone  (ROCEPHIN )  IV 1 g (03/25/24 1327)   feeding supplement (OSMOLITE 1.5 CAL) 50 mL/hr at 03/25/24 1300   metronidazole       Anti-infectives (From admission, onward)    Start     Dose/Rate Route Frequency Ordered Stop   03/25/24 1330  metroNIDAZOLE (FLAGYL) IVPB 500 mg        500 mg 100 mL/hr over 60 Minutes Intravenous Every 12 hours 03/25/24 1214     03/25/24 1300  cefTRIAXone  (ROCEPHIN ) 1 g in sodium chloride  0.9 % 100 mL IVPB        1 g 200 mL/hr over 30 Minutes Intravenous Every 24 hours 03/25/24 1214     03/19/24 1630  azithromycin  (ZITHROMAX ) 500 mg in sodium chloride  0.9 % 250 mL IVPB  Status:  Discontinued         500 mg 250 mL/hr over 60 Minutes Intravenous Every 24 hours 03/19/24 1533 03/22/24 1542   03/18/24 1045  cefTRIAXone  (ROCEPHIN ) 1 g in sodium chloride  0.9 % 100 mL IVPB  Status:  Discontinued        1 g 200 mL/hr over 30 Minutes Intravenous Every 24 hours 03/18/24 0945 03/22/24 1542       Family Communication/Anticipated D/C date and plan/Code Status   DVT prophylaxis: enoxaparin  (LOVENOX ) injection 40 mg Start: 03/18/24 1038     Code Status: Do not attempt resuscitation (DNR) PRE-ARREST INTERVENTIONS DESIRED  Family Communication: Discussed with daughter and son at bedside  Disposition Plan: Plan to discharge to SNF   Status is: Inpatient Remains inpatient appropriate because: Acute stroke and persistent encephalopathy   Subjective:  Patient was seen and examined today.  Remained unresponsive.  Family at bedside.  They again started talking regarding the ED course.  They were also concerned that patient is not getting changing position every 2 hour.  Objective:    Vitals:   03/25/24 0743 03/25/24 0932 03/25/24 1001 03/25/24 1214  BP: (!) 160/64  (!) 160/64 (!) 118/55  Pulse: 93 96 92 90  Resp: (!) 32 (!) 35  (!) 32  Temp: 97.8 F (36.6 C)   99.1 F (37.3 C)  TempSrc: Axillary   Axillary  SpO2: 96% 97%  97%  Weight:      Height:       No data found.   Intake/Output Summary (Last 24 hours) at 03/25/2024 1407 Last data filed at 03/25/2024 1300 Gross per 24 hour  Intake 1551 ml  Output 600 ml  Net 951 ml   Filed Weights   03/23/24 0500 03/24/24 0449 03/25/24 0345  Weight: 87.4 kg 87.1 kg 85.1 kg    Exam: General.  Ill-appearing, obese elderly lady, in no acute distress. Pulmonary.  Lungs clear bilaterally, normal respiratory effort. CV.  Regular rate and rhythm, no JVD, rub or murmur. Abdomen.  Soft, nontender, nondistended, BS positive. CNS.  Unresponsive lady Extremities.  No edema, no cyanosis, pulses intact and symmetrical.  Data Reviewed:   I  have personally reviewed following labs and imaging studies:  Labs: Labs show the following:   Basic Metabolic Panel: Recent Labs  Lab 03/19/24 0404 03/20/24 0600 03/21/24 0452 03/22/24 0426 03/23/24 0940 03/24/24 0457 03/25/24 0535  NA 150* 153* 146* 147* 145 150* 150*  K 4.0 3.9 4.0 4.5 4.8 4.7 4.7  CL 113* 117* 110 111 110 111 110  CO2 25 25 28 26 28  32 34*  GLUCOSE 100* 96 153* 150* 196* 112* 161*  BUN 18 18 19 17 17 17  25*  CREATININE 0.92 0.83 0.88 0.65 0.65 0.67 0.81  CALCIUM  9.2 9.2 9.0 9.0 8.9 9.3 9.2  MG 2.5* 2.5* 2.4 2.5*  --   --   --   PHOS 4.0 3.5 3.4 3.6 3.5 4.1 4.1   GFR Estimated Creatinine Clearance: 64.6 mL/min (by C-G formula based on SCr of 0.81 mg/dL). Liver Function Tests: Recent Labs  Lab 03/19/24 0404 03/20/24 0600 03/22/24 0426 03/23/24 0940 03/24/24 0457 03/25/24 0535  AST 49*  --   --   --   --   --   ALT 29  --   --   --   --   --   ALKPHOS 108  --   --   --   --   --   BILITOT 0.7  --   --   --   --   --   PROT 7.3  --   --   --   --   --   ALBUMIN 4.0 3.8 3.3* 3.1* 3.2* 3.2*   No results for input(s): LIPASE, AMYLASE in the last 168 hours.  Recent Labs  Lab 03/23/24 1028  AMMONIA 37*   Coagulation profile No results for input(s): INR, PROTIME in the last 168 hours.  CBC: Recent Labs  Lab 03/19/24 0404 03/20/24 0600 03/21/24 0452 03/22/24 0426  WBC 26.5* 17.3* 13.1* 9.9  HGB 13.6 13.2 12.4 12.5  HCT 42.6 41.5 39.1 39.5  MCV 90.8 90.4 90.1 89.6  PLT 309 275 248 238   Cardiac Enzymes: No results for input(s): CKTOTAL, CKMB, CKMBINDEX, TROPONINI in the last 168 hours. BNP (last 3 results) No results for input(s): PROBNP in the last 8760 hours. CBG: Recent Labs  Lab 03/24/24 2004 03/25/24 0106 03/25/24 0345 03/25/24 0745 03/25/24 1220  GLUCAP 157* 185* 177* 148* 131*   D-Dimer: No results for input(s): DDIMER in the last 72 hours.  Hgb A1c: No results for input(s): HGBA1C in the last  72 hours.  Lipid Profile: No results for input(s): CHOL, HDL, LDLCALC, TRIG, CHOLHDL, LDLDIRECT in the last 72 hours.  Thyroid  function studies: Recent Labs    03/23/24 1028  TSH 2.260   Anemia work up: Recent Labs    03/23/24 1028  VITAMINB12 165*   Sepsis Labs: Recent Labs  Lab 03/19/24 0404 03/20/24 0600 03/21/24 0452 03/22/24 0426  WBC 26.5* 17.3* 13.1* 9.9    Microbiology No results found for this or any previous visit (from the past 240 hours).  Procedures and diagnostic studies:  DG Abd 1 View Result Date: 03/23/2024 EXAM: 1 VIEW XRAY OF THE ABDOMEN 03/23/2024 05:54:51 PM COMPARISON: Comparison with 03/20/2024. CLINICAL HISTORY: [id number] Encounter for care related to feeding tube [id number] Encounter for care related to feeding tube FINDINGS: LIMITATIONS/ARTIFACTS: Limited field of view for tube placement verification purposes. LINES, TUBES AND DEVICES: An enteric tube is present with the tip projecting over the upper mid-abdomen, consistent with a location in the body of the stomach. BOWEL: Nonobstructive bowel gas pattern. SOFT TISSUES: No opaque urinary calculi. BONES: No acute osseous abnormality. IMPRESSION: 1. Enteric tube tip projects over the upper mid-abdomen, consistent with a location in the body of the stomach. Electronically signed by: Elsie Gravely MD 03/23/2024 06:19 PM EST RP Workstation: [id number]   MR BRAIN WO CONTRAST Result Date: 03/23/2024 EXAM: MRI BRAIN WITHOUT CONTRAST 03/23/2024 04:04:32 PM  TECHNIQUE: Multiplanar multisequence MRI of the head/brain was performed without the administration of intravenous contrast. COMPARISON: Brain MRI 03/18/2024, and earlier exams. CLINICAL HISTORY: 78 year old female with recent bilateral ACA territory infarcts, left greater than right. FINDINGS: BRAIN AND VENTRICLES: Progressed bilaterally ACA territory infarcts and cytotoxic edema, left greater than right. No hemorrhagic transformation. No new areas of  acute infarction. New intrinsic T1 hyperintensity within the left caudate, possibly petechial blood. No other acute intracranial hemorrhage. No intracranial mass effect or midline shift. No hydrocephalus. The sella is unremarkable. Visible major vascular flow voids remain stable, including probable maintained patency at the ACA origins. Gyral enlargement more pronounced in the left ACA territory, and confluent involvement of the left half of the corpus callosum as seen on series 17 image 19. Elnor and white matter signal elsewhere is stable including bilateral confluent but nonspecific periventricular white matter T2 and FLAIR hyperintensity. The left caudate nucleus which appeared edematous and enlarged previously now appears virtually normal (series 8 images 14 and 15). However, there is new intrinsic T1 hyperintensity within the left caudate as seen on series 14 image 84. This might be petechial blood. No malignant hemorrhagic transformation or mass effect. Chronic lacunar infarct at the ventral right thalamus again noted. No convincing involvement of other vascular territories. Chronic small vessel disease. ORBITS: Orbit detail limited today by susceptibility artifact. SINUSES AND MASTOIDS: Paranasal sinus detail limited today by susceptibility artifact. Mild increased bilateral mastoid air cell effusions. BONES AND SOFT TISSUES: Metal susceptibility artifact associated with the face, ethmoid sinuses. Negative visible cervical spine. IMPRESSION: 1. Progressed bilateral ACA territory infarcts with cytotoxic edema, left greater than right. No hemorrhagic transformation. No significant mass effect. 2. Evolution of left caudate infarct with possible petechial hemorrhage, but no malignant hemorrhagic transformation or mass effect. 3. No new vascular territory ischemia. Chronic small vessel disease. Electronically signed by: Helayne Hurst MD 03/23/2024 05:35 PM EST RP Workstation: [id number]   Time spent.  50  minutes  This record has been created using Conservation officer, historic buildings. Errors have been sought and corrected,but may not always be located. Such creation errors do not reflect on the standard of care.    LOS: 7 days   Amaryllis Dare  [facility]   Pager on [email or web address]. If 7PM-7AM, please contact night-coverage at [email or web address]  03/25/2024, 2:07 PM

## 2024-03-25 NOTE — Plan of Care (Signed)
  Problem: Clinical Measurements: Goal: Cardiovascular complication will be avoided Outcome: Progressing   Problem: Nutrition: Goal: Adequate nutrition will be maintained Outcome: Progressing   Problem: Elimination: Goal: Will not experience complications related to bowel motility Outcome: Progressing Goal: Will not experience complications related to urinary retention Outcome: Progressing   Problem: Pain Managment: Goal: General experience of comfort will improve and/or be controlled Outcome: Progressing   Problem: Safety: Goal: Ability to remain free from injury will improve Outcome: Progressing   Problem: Skin Integrity: Goal: Risk for impaired skin integrity will decrease Outcome: Progressing

## 2024-03-25 NOTE — Progress Notes (Signed)
 Daily Progress Note   Patient Name: Marie Pham       Date: 03/25/2024 DOB: 15-Nov-1945  Age: 78 y.o. MRN#: [id number] Attending Physician: Caleen Qualia, MD Primary Care Physician: Tobie Domino, MD Admit Date: 03/17/2024  Reason for Consultation/Follow-up: Establishing goals of care  HPI/Brief Hospital Review: 78 y.o. female  with past medical history of hypertension, chronic back pain, depression and sleep apnea admitted from home on 03/17/2024 with initial complaints of worsening chronic back pain. Reportedly from family in ED, Ms. Marie Pham had poor PO intake and less activity day prior to admission. Last known well time established in ED >24 hours.   CTA obtained in ED revealed hypodensity in the left basal ganglia suspicious for acute/recent infarct recommendations for MRI to follow-up--occluded versus severely stenotic distal right nondominant vertebral artery--severe bilateral P2 PCA stenosis--- moderate left vertebral artery origin stenosis--- mild to moderate left M1 and M2 MCA stenoses MRI obtained and revealing confluent left ACA territory [name] acute infarct involving inferomedial left caudate nucleus, small area of acute infarct in the proximal right ACA territory and several subcentimeter foci of acute ischemia in the right distal MCA/ACA watershed--cytotoxic edema with no hemorrhagic transformation   CTA chest without evidence of acute pulmonary embolism--concern for possible air trapping or pneumonitis--noted cardiomegaly with coronary artery calcifications   Palliative medicine was consulted for assisting with goals of care conversations.  Subjective: Extensive chart review has been completed prior to meeting patient including labs, vital signs, imaging, progress notes, orders,  and available advanced directive documents from current and previous encounters.    Visited with Ms. Marie Pham at her bedside.  She does not have a response to calling of her name or gentle tactile stimulus.  She has no obvious signs of discomfort or distress.  NG tube remains in place with tube feeds infusing.  Family at bedside during time of visit.  Primary team at bedside on arrival to visit with Ms. Marie Pham.  Daughter expressing her frustrations and concerns around the care Ms. Marie Pham has been provided since hospitalization.  She emphasizes concern related to sacral wounds that Ms. Marie Pham has developed since hospitalization.  She references poor nursing response and every 2 hour turns not being adhered to.  Ensured daughter and family concerns related to care would be relayed to charge nurse with recommendation to  banker as well as patient experience.  Primary team has also ensured daughter air mattress has been ordered pending delivery to also help alleviate further wounds.  We discussed high risk of wound development due to her current condition as well as medical conditions.  Reviewed medical updates that have occurred since our last visit last weekend.  Concern today related to Ms. Marie Pham spiking a low-grade temperature with possible concern for aspiration.  Per primary team Ms. Marie Pham has been started on appropriate antibiotic therapy.  Mentation/cognition remains poor.  Per neurology last note overall concern for ability to recover with concern for overall poor prognosis.  PEG tube has been discussed and explored with family.  Family interested in pursuing PEG tube.  Explained need for Plavix  washout prior to placement of PEG tube which will likely take place midweek.  Daughter expresses need for assistance in completing Medicare open enrollment as well as reapplying for Medicaid.  Daughter has attempted to complete these applications but has been told she is unable to speak on Ms.  Marie Pham's behalf as there is not a completed healthcare power of attorney document.  It may be needed that provider notes state Mr. Marie Pham is incapable of making her own decisions at this time.  Engaged with TOC with recommendations to engage with financial advisor.  Per TOC, business office will contact family on Monday.  Answered and addressed all family questions and concerns.  Spoke with nursing staff as well as charge nurse regarding family grievances with recommendation to electrical engineer as well as patient experience.  PMT to continue to follow for ongoing needs and support.  Objective:  Physical Exam Constitutional:      General: She is not in acute distress.    Appearance: She is obese. She is ill-appearing.  HENT:     Head: Normocephalic.     Mouth/Throat:     Mouth: Mucous membranes are dry.  Pulmonary:     Effort: Pulmonary effort is normal. No respiratory distress.  Abdominal:     General: There is no distension.     Palpations: Abdomen is soft.  Skin:    General: Skin is warm.  Neurological:     Mental Status: She is unresponsive.             Vital Signs: BP (!) 126/55   Pulse 94   Temp 98 F (36.7 C) (Axillary)   Resp (!) 33   Ht 5' 5 (1.651 m)   Wt 85.1 kg   SpO2 95%   BMI 31.22 kg/m  SpO2: SpO2: 95 % O2 Device: O2 Device: Room Air O2 Flow Rate: O2 Flow Rate (L/min): 1 L/min   Palliative Care Assessment & Plan   Assessment/Recommendation/Plan  Continue current plan of care Nursing staff made aware of family grievances  Care plan was discussed with primary team, nursing staff and [facility]  Thank you for allowing the Palliative Medicine Team to assist in the care of this patient  Visit includes: Detailed review of medical records (labs, imaging, vital signs), medically appropriate exam (mental status, respiratory, cardiac, skin), discussed with treatment team, counseling and educating patient, family and staff, documenting clinical information,  medication management and coordination of care.  Waddell Lesches, DNP, AGNP-C Palliative Medicine   Please contact Palliative Medicine Team phone at [phone] for questions and concerns.

## 2024-03-25 NOTE — TOC Progression Note (Signed)
 Transition of Care [facility]) - Progression Note    Patient Details  Name: Marie Pham MRN: [id number] Date of Birth: 1945/11/27  Transition of Care [facility]) CM/SW Contact  Victory Jackquline RAMAN, RN Phone Number: 03/25/2024, 12:18 PM  Clinical Narrative:   RNCM received a message via secure chat from the NP, informing me that this family has several questions related to Medicare/Medicaid. They were in the process of compelting open enrollment--this was prior to Ms. Messmer's current situation, she is incpacitated to make any decisions. Daughter is having difficulty with having Medicare assist her in completing open enrollment as Ms. Simkin does not have HCPOA documentation. They will also need assistance from financial counselor regarding LTC Medicaid. Annabella Gin from the Business Office/Financial Assistance made aware. RNCM will continue to follow for discharge planning needs.    Expected Discharge Plan: Skilled Nursing Facility Barriers to Discharge: Continued Medical Work up               Expected Discharge Plan and Services     Post Acute Care Choice: Skilled Nursing Facility Living arrangements for the past 2 months: Single Family Home                                       Social Drivers of Health (SDOH) Interventions SDOH Screenings   Food Insecurity: Patient Unable To Answer (03/18/2024)  Housing: Patient Unable To Answer (03/18/2024)  Transportation Needs: Patient Unable To Answer (03/18/2024)  Utilities: Patient Unable To Answer (03/18/2024)  Financial Resource Strain: High Risk (09/01/2023)   Received from [facility]  Social Connections: Patient Unable To Answer (03/18/2024)  Tobacco Use: Medium Risk (03/17/2024)    Readmission Risk Interventions     No data to display

## 2024-03-25 NOTE — Progress Notes (Signed)
 Patient turned to her right side.  Mouth care and peri care completed.  Daughter at bedside.  No additional needs at this time.

## 2024-03-25 NOTE — Progress Notes (Signed)
 Patient placed in supine postion.  Oral care provided. Suctioned per family request.  No additional needs at this time.

## 2024-03-26 DIAGNOSIS — N179 Acute kidney failure, unspecified: Secondary | ICD-10-CM | POA: Diagnosis not present

## 2024-03-26 DIAGNOSIS — R6510 Systemic inflammatory response syndrome (SIRS) of non-infectious origin without acute organ dysfunction: Secondary | ICD-10-CM | POA: Diagnosis not present

## 2024-03-26 DIAGNOSIS — I350 Nonrheumatic aortic (valve) stenosis: Secondary | ICD-10-CM | POA: Diagnosis not present

## 2024-03-26 DIAGNOSIS — Z515 Encounter for palliative care: Secondary | ICD-10-CM | POA: Diagnosis not present

## 2024-03-26 DIAGNOSIS — I639 Cerebral infarction, unspecified: Secondary | ICD-10-CM | POA: Diagnosis not present

## 2024-03-26 LAB — RENAL FUNCTION PANEL
Albumin: 3.1 g/dL — ABNORMAL LOW (ref 3.5–5.0)
Anion gap: 8 (ref 5–15)
BUN: 28 mg/dL — ABNORMAL HIGH (ref 8–23)
CO2: 32 mmol/L (ref 22–32)
Calcium: 8.9 mg/dL (ref 8.9–10.3)
Chloride: 109 mmol/L (ref 98–111)
Creatinine, Ser: 0.73 mg/dL (ref 0.44–1.00)
GFR, Estimated: >60 mL/min (ref >60)
Glucose, Bld: 201 mg/dL — ABNORMAL HIGH (ref 70–99)
Phosphorus: 3.2 mg/dL (ref 2.5–4.6)
Potassium: 4.4 mmol/L (ref 3.5–5.1)
Sodium: 148 mmol/L — ABNORMAL HIGH (ref 135–145)

## 2024-03-26 LAB — GLUCOSE, CAPILLARY
Glucose-Capillary: 152 mg/dL — ABNORMAL HIGH (ref 70–99)
Glucose-Capillary: 188 mg/dL — ABNORMAL HIGH (ref 70–99)
Glucose-Capillary: 173 mg/dL — ABNORMAL HIGH (ref 70–99)
Glucose-Capillary: 205 mg/dL — ABNORMAL HIGH (ref 70–99)
Glucose-Capillary: 198 mg/dL — ABNORMAL HIGH (ref 70–99)

## 2024-03-26 NOTE — Progress Notes (Signed)
 Transferred onto air mattress bed.

## 2024-03-26 NOTE — Progress Notes (Signed)
 Patient turned to right side.  Tylenol  given per family request

## 2024-03-26 NOTE — Progress Notes (Signed)
 Patient placed in supine position.  Oral suction provided.  No additional needs at this time

## 2024-03-26 NOTE — Progress Notes (Signed)
 RN made Dr. Caleen aware that patient has had several type 7 stools today and continues to go. Dr. Caleen gave order to place flexiseal.

## 2024-03-26 NOTE — Progress Notes (Signed)
 Progress Note    Marie Pham  FMW:[id number] DOB: 04/11/1946  DOA: 03/17/2024 PCP: Tobie Domino, MD      Brief Narrative:    Medical records reviewed and are as summarized below:  Marie Pham is a 78 y.o. female with medical history significant for hypertension, chronic back pain, arthritis, sleep apnea, depression, asthma/bronchitis, ambulates with a walker at baseline who presented to the hospital because of poor oral intake and speech difficulties.  Family had tried to communicate with her and she appeared to understand what her son was saying but she could not respond.  Onset or duration of speech difficulty is not clear.  In the ED, apparently patient indicated that her back hurts.  Vitals in the ED: Temperature 98.7 F, respiratory rate 18, pulse 115, BP 120/99 oxygen saturation 95% on room air.  Troponins negative, mildly elevated D-dimer 0.92. EKG shows sinus tachycardia, right bundle branch block, prolonged QTc at 494  CT angiogram of head and neck showed acute left basal ganglia infarct.  Chest x-ray 1. Linear atelectasis in the left base and right mid lung, which may contribute to shortness of breath. 2. No acute cardiopulmonary process identified.  She was admitted to the hospital for acute stroke.  11/26: Patient very somnolent and difficult to arouse, EEG was also ordered.  Family would like to continue full scope of care, might need a PEG tube placement.  Currently being fed with corTrack.  11/27: No change in mentation, remained minimally responsive to obnoxious stimuli.  Repeat ABG with mild hypercarbia at 49 which does not explain this encephalopathy, ammonia mildly elevated at 37, we will give her a trial of bicarb, also adding lactulose .  Repeating brain MRI and neurology was reconsulted.  1 episode of A-fib was mentioned on her monitor on 11/23, talked with central monitoring system and it was PACs, she never had any documented atrial  fibrillation.  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  IR was consulted for PEG tube placement and Plavix  need to be held for 5 days, most likely will get her PEG on 03/29/2024.  TOC to work on placement.  Daughter works in a facility in [employer] and trying to get her there. Family would like to continue current level of care to see if there is any improvement in her mental status over the next few months before thinking about transitioning to hospice.  Neurology is not very hopeful.  Only time will tell.  Repeat MRI yesterday with progressed bilateral ACA territory infarct with cytotoxic edema, left greater than right, no hemorrhagic transformation or mass effect.  Also noted evolution of left caudate infarct with possible petechial hemorrhage but no malignant hemorrhagic transformation or mass effect.  No new vascular territory ischemia.  Developed diarrhea so holding MiraLAX  and lactulose .  11/29: Remained unresponsive, family concern of developing pressure injuries and nurses are not changing position every 2 hourly as recommended-discussed with nursing staff.  Air mattress was also ordered.  Persistent hypernatremia at 150, increasing the frequency of free water  to Q4 hourly.  Patient developed low-grade fever overnight, had an aspiration episode on 11/27-starting on ceftriaxone  and Flagyl .  11/30: Hemodynamically stable, improving hyponatremia now at 148.  Patient started breathing a little heavy when did the sternal rub, apparently she did open her eyes and look at daughter earlier.  Patient with very guarded prognosis.   Assessment/Plan:   Principal Problem:   Acute CVA (cerebrovascular accident) [facility]) Active Problems:  AKI (acute kidney injury)   Hypernatremia   SIRS (systemic inflammatory response syndrome) (HCC)   Abnormal EKG   Essential hypertension   Severe aortic stenosis   Pneumonitis   Body mass index is 31.48 kg/m.  (Class I  obesity)  Acute stroke with lethargy, right facial droop, right hemiparesis and aphasia: Persistent encephalopathy Patient with bilateral frontal lobe and basal ganglia infarct. 2D echo showed EF estimated at 60 to 65%, grade 3 diastolic dysfunction, severe aortic valve stenosis. Continue low-dose aspirin  and Lipitor  via NG tube Holding Plavix  for 5 days, need PEG tube placed on 03/29/2024 PT and OT recommended discharge to SNF. Patient remained unresponsive S/p NG tube placement by IR on 03/20/2024 Continue enteral nutrition via NG tube  EEG with generalized cortical dysfunction, no epileptiform discharges. ABG with mild hypercarbia and mildly elevated ammonia. - Repeat brain MRI with progress bilateral ACA territory infarct, cytogenic edema with no mass effect or hemorrhagic transformation, please see the full report. - Patient is not a candidate for BiPAP due to unable to protect airway, barely responding to noxious stimuli.  SIRS, tachycardia, tachypnea, leukocytosis, pneumonitis:  Leukocytosis resolved Repeat chest x-ray on 03/21/2024 because rapid response was called in the early hours of the morning for tachypnea.  Chest x-ray showed low lung volumes with bronchovascular crowding but there was no focal consolidation.  CTA chest on11/22/2025 did not show any evidence of pulmonary embolism but there is evidence of bilateral pneumonitis.  This is probably due to aspiration.   - Completed a 5-day course of antibiotics with ceftriaxone  and Zithromax   11/29: Became febrile again, had another episode of aspiration on 11/27-starting on ceftriaxone  and Flagyl.  Severe aortic stenosis, grade 3 diastolic dysfunction: Will need outpatient follow-up with cardiologist.  Hypernatremia, dehydration: Improved.  Sodium down from 153-146.>147.145>>150>.150>>148 - Continue with current frequency of free water  -Continue to monitor sodium AKI ruled out.  Hypertension: Blood pressure within goal   Continue carvedilol .  Chronic back pain/lumbar radiculitis - Pain management - Currently patient is unresponsive   Plan of care was discussed with [name] (son), they want to keep her full scope of care to see the trajectory. They think that care was delayed in the ED and patient could have been a candidate for tPA.  They said they have read a lot about strokes. I explained that it was documented in the chart that patient was last known normal over 24 hours, and that given tPA beyond the recommended timeframe could potentially have serious complications but with little benefit.   Diet Order             Diet NPO time specified  Diet effective midnight           Diet NPO time specified  Diet effective now                  Consultants: Neurologist  Procedures: NG tube placement by IR on 03/20/2024  Medications:    vitamin C  500 mg Per Tube BID   aspirin   81 mg Per NG tube Daily   atorvastatin   80 mg Per NG tube Daily   carvedilol   12.5 mg Per NG tube BID WC   cyanocobalamin   1,000 mcg Intramuscular Q0600   enoxaparin  (LOVENOX ) injection  40 mg Subcutaneous Q24H   feeding supplement (PROSource TF20)  60 mL Per Tube Daily   free water   200 mL Per Tube Q4H   latanoprost  1 drop Both Eyes QHS   losartan   25 mg Per Tube Daily   multivitamin with minerals  1 tablet Per Tube Daily   mouth rinse  15 mL Mouth Rinse 4 times per day   zinc sulfate (50mg  elemental zinc)  220 mg Per Tube Daily   Continuous Infusions:  cefTRIAXone  (ROCEPHIN )  IV 1 g (03/26/24 1426)   feeding supplement (OSMOLITE 1.5 CAL) 50 mL/hr at 03/26/24 1426   metronidazole Stopped (03/26/24 1339)     Anti-infectives (From admission, onward)    Start     Dose/Rate Route Frequency Ordered Stop   03/25/24 1330  metroNIDAZOLE (FLAGYL) IVPB 500 mg        500 mg 100 mL/hr over 60 Minutes Intravenous Every 12 hours 03/25/24 1214     03/25/24 1300  cefTRIAXone  (ROCEPHIN ) 1 g in sodium chloride  0.9 % 100 mL IVPB         1 g 200 mL/hr over 30 Minutes Intravenous Every 24 hours 03/25/24 1214     03/19/24 1630  azithromycin  (ZITHROMAX ) 500 mg in sodium chloride  0.9 % 250 mL IVPB  Status:  Discontinued        500 mg 250 mL/hr over 60 Minutes Intravenous Every 24 hours 03/19/24 1533 03/22/24 1542   03/18/24 1045  cefTRIAXone  (ROCEPHIN ) 1 g in sodium chloride  0.9 % 100 mL IVPB  Status:  Discontinued        1 g 200 mL/hr over 30 Minutes Intravenous Every 24 hours 03/18/24 0945 03/22/24 1542       Family Communication/Anticipated D/C date and plan/Code Status   DVT prophylaxis: enoxaparin  (LOVENOX ) injection 40 mg Start: 03/18/24 1038     Code Status: Do not attempt resuscitation (DNR) PRE-ARREST INTERVENTIONS DESIRED  Family Communication: Discussed with daughter and son at bedside  Disposition Plan: Plan to discharge to SNF   Status is: Inpatient Remains inpatient appropriate because: Acute stroke and persistent encephalopathy   Subjective:  Patient remained pretty much unresponsive but did showed some response with heavy breathing when tried to do sternal rub, did not follow any commands.  Did open eyes earlier while getting cleaned in front of daughter.  Objective:    Vitals:   03/26/24 0456 03/26/24 0854 03/26/24 0918 03/26/24 1254  BP:  (!) 147/56 (!) 147/56 130/61  Pulse:  89 87 90  Resp:  (!) 31  (!) 35  Temp:  (!) 97.3 F (36.3 C)  97.8 F (36.6 C)  TempSrc:  Axillary  Axillary  SpO2:  93%  93%  Weight: 85.8 kg     Height:       No data found.   Intake/Output Summary (Last 24 hours) at 03/26/2024 1457 Last data filed at 03/26/2024 1426 Gross per 24 hour  Intake 2871.85 ml  Output 800 ml  Net 2071.85 ml   Filed Weights   03/24/24 0449 03/25/24 0345 03/26/24 0456  Weight: 87.1 kg 85.1 kg 85.8 kg    Exam: General.  Obese and frail elderly lady, in no acute distress. Pulmonary.  Lungs clear bilaterally, normal respiratory effort. CV.  Regular rate and rhythm, no  JVD, rub or murmur. Abdomen.  Soft, nontender, nondistended, BS positive. CNS.  Pretty much unresponsive, not following any commands Extremities.  No edema,  pulses intact and symmetrical.   Data Reviewed:   I have personally reviewed following labs and imaging studies:  Labs: Labs show the following:   Basic Metabolic Panel: Recent Labs  Lab 03/20/24 0600 03/21/24 0452 03/22/24 0426 03/23/24 0940 03/24/24 0457 03/25/24 0535 03/26/24 9480  NA 153* 146* 147* 145 150* 150* 148*  K 3.9 4.0 4.5 4.8 4.7 4.7 4.4  CL 117* 110 111 110 111 110 109  CO2 25 28 26 28  32 34* 32  GLUCOSE 96 153* 150* 196* 112* 161* 201*  BUN 18 19 17 17 17  25* 28*  CREATININE 0.83 0.88 0.65 0.65 0.67 0.81 0.73  CALCIUM  9.2 9.0 9.0 8.9 9.3 9.2 8.9  MG 2.5* 2.4 2.5*  --   --   --   --   PHOS 3.5 3.4 3.6 3.5 4.1 4.1 3.2   GFR Estimated Creatinine Clearance: 65.7 mL/min (by C-G formula based on SCr of 0.73 mg/dL). Liver Function Tests: Recent Labs  Lab 03/22/24 0426 03/23/24 0940 03/24/24 0457 03/25/24 0535 03/26/24 0519  ALBUMIN 3.3* 3.1* 3.2* 3.2* 3.1*   No results for input(s): LIPASE, AMYLASE in the last 168 hours.  Recent Labs  Lab 03/23/24 1028  AMMONIA 37*   Coagulation profile No results for input(s): INR, PROTIME in the last 168 hours.  CBC: Recent Labs  Lab 03/20/24 0600 03/21/24 0452 03/22/24 0426  WBC 17.3* 13.1* 9.9  HGB 13.2 12.4 12.5  HCT 41.5 39.1 39.5  MCV 90.4 90.1 89.6  PLT 275 248 238   Cardiac Enzymes: No results for input(s): CKTOTAL, CKMB, CKMBINDEX, TROPONINI in the last 168 hours. BNP (last 3 results) No results for input(s): PROBNP in the last 8760 hours. CBG: Recent Labs  Lab 03/25/24 1656 03/25/24 1956 03/26/24 0331 03/26/24 0856 03/26/24 1310  GLUCAP 213* 182* 152* 188* 173*   D-Dimer: No results for input(s): DDIMER in the last 72 hours.  Hgb A1c: No results for input(s): HGBA1C in the last 72 hours.  Lipid  Profile: No results for input(s): CHOL, HDL, LDLCALC, TRIG, CHOLHDL, LDLDIRECT in the last 72 hours.  Thyroid  function studies: No results for input(s): TSH, T4TOTAL, T3FREE, THYROIDAB in the last 72 hours.  Invalid input(s): FREET3  Anemia work up: No results for input(s): VITAMINB12, FOLATE, FERRITIN, TIBC, IRON, RETICCTPCT in the last 72 hours.  Sepsis Labs: Recent Labs  Lab 03/20/24 0600 03/21/24 0452 03/22/24 0426  WBC 17.3* 13.1* 9.9    Microbiology No results found for this or any previous visit (from the past 240 hours).  Procedures and diagnostic studies:  No results found.  Time spent.  50 minutes  This record has been created using Conservation officer, historic buildings. Errors have been sought and corrected,but may not always be located. Such creation errors do not reflect on the standard of care.    LOS: 8 days   Amaryllis Dare  [facility]   Pager on [email or web address]. If 7PM-7AM, please contact night-coverage at [email or web address]  03/26/2024, 2:57 PM

## 2024-03-26 NOTE — Progress Notes (Signed)
 Patient turned to left side.  Oral suction provided per family request.  No additional needs at this time

## 2024-03-26 NOTE — Plan of Care (Signed)

## 2024-03-26 NOTE — Progress Notes (Signed)
 Daily Progress Note   Patient Name: Marie Pham       Date: 03/26/2024 DOB: 1946-01-15  Age: 78 y.o. MRN#: [id number] Attending Physician: Caleen Qualia, MD Primary Care Physician: Tobie Domino, MD Admit Date: 03/17/2024  Reason for Consultation/Follow-up: Establishing goals of care  HPI/Brief Hospital Review: 78 y.o. female  with past medical history of hypertension, chronic back pain, depression and sleep apnea admitted from home on 03/17/2024 with initial complaints of worsening chronic back pain. Reportedly from family in ED, Ms. Allbright had poor PO intake and less activity day prior to admission. Last known well time established in ED >24 hours.   CTA obtained in ED revealed hypodensity in the left basal ganglia suspicious for acute/recent infarct recommendations for MRI to follow-up--occluded versus severely stenotic distal right nondominant vertebral artery--severe bilateral P2 PCA stenosis--- moderate left vertebral artery origin stenosis--- mild to moderate left M1 and M2 MCA stenoses MRI obtained and revealing confluent left ACA territory [name] acute infarct involving inferomedial left caudate nucleus, small area of acute infarct in the proximal right ACA territory and several subcentimeter foci of acute ischemia in the right distal MCA/ACA watershed--cytotoxic edema with no hemorrhagic transformation   CTA chest without evidence of acute pulmonary embolism--concern for possible air trapping or pneumonitis--noted cardiomegaly with coronary artery calcifications   Palliative medicine was consulted for assisting with goals of care conversations.  Subjective: Extensive chart review has been completed prior to meeting patient including labs, vital signs, imaging, progress notes, orders,  and available advanced directive documents from current and previous encounters.    Visited with Ms. Warner at her bedside.  On assessment she remains minimally responsive.  Family at bedside during time of visit including daughter and granddaughter.  Ms. Guerin is response to family vastly different than in personal assessment.  With family Ms. Buzzelli is able to follow simple commands such as opening eyes and opening and closing mouth.  There is no meaningful type of communication.  Ms. Mies appears comfortable without acute distress or discomfort.  Daughter shares improvement in nursing care overnight and throughout today.  Reviewed again TOC to have financial advisor/business office be in contact with her tomorrow or early next week.  Ensure daughter PMT will continue to follow and support.  Plan remains for Ms. Cavitt to have PEG placed after  Plavix  washout with eventual discharge to facility.  Objective:  Physical Exam Constitutional:      General: She is not in acute distress.    Appearance: She is obese. She is ill-appearing.  HENT:     Head: Normocephalic.     Mouth/Throat:     Mouth: Mucous membranes are dry.  Pulmonary:     Effort: Pulmonary effort is normal. No respiratory distress.  Abdominal:     Palpations: Abdomen is soft.     Tenderness: There is no abdominal tenderness.  Skin:    General: Skin is warm and dry.  Neurological:     Motor: Weakness present.     Comments: Minimally responsive  Psychiatric:        Behavior: Behavior normal.             Vital Signs: BP (!) 140/59   Pulse 94   Temp 99.1 F (37.3 C) (Axillary)   Resp (!) 31   Ht 5' 5 (1.651 m)   Wt 85.8 kg   SpO2 97%   BMI 31.48 kg/m  SpO2: SpO2: 97 % O2 Device: O2 Device: Room Air O2 Flow Rate: O2 Flow Rate (L/min): 1 L/min   Palliative Care Assessment & Plan   Assessment/Recommendation/Plan  Continue with current plan of care Family in need of financial advisor support--TOC aware  Thank  you for allowing the Palliative Medicine Team to assist in the care of this patient.  Visit includes: Detailed review of medical records (labs, imaging, vital signs), medically appropriate exam (mental status, respiratory, cardiac, skin), discussed with treatment team, counseling and educating patient, family and staff, documenting clinical information, medication management and coordination of care.  Waddell Lesches, DNP, AGNP-C Palliative Medicine   Please contact Palliative Medicine Team phone at [phone] for questions and concerns.

## 2024-03-27 ENCOUNTER — Inpatient Hospital Stay

## 2024-03-27 DIAGNOSIS — R509 Fever, unspecified: Secondary | ICD-10-CM | POA: Diagnosis not present

## 2024-03-27 DIAGNOSIS — N179 Acute kidney failure, unspecified: Secondary | ICD-10-CM | POA: Diagnosis not present

## 2024-03-27 DIAGNOSIS — J690 Pneumonitis due to inhalation of food and vomit: Secondary | ICD-10-CM | POA: Diagnosis not present

## 2024-03-27 DIAGNOSIS — R6510 Systemic inflammatory response syndrome (SIRS) of non-infectious origin without acute organ dysfunction: Secondary | ICD-10-CM | POA: Diagnosis not present

## 2024-03-27 DIAGNOSIS — I639 Cerebral infarction, unspecified: Secondary | ICD-10-CM | POA: Diagnosis not present

## 2024-03-27 DIAGNOSIS — Z515 Encounter for palliative care: Secondary | ICD-10-CM | POA: Diagnosis not present

## 2024-03-27 LAB — GLUCOSE, CAPILLARY
Glucose-Capillary: 147 mg/dL — ABNORMAL HIGH (ref 70–99)
Glucose-Capillary: 160 mg/dL — ABNORMAL HIGH (ref 70–99)
Glucose-Capillary: 189 mg/dL — ABNORMAL HIGH (ref 70–99)
Glucose-Capillary: 202 mg/dL — ABNORMAL HIGH (ref 70–99)
Glucose-Capillary: 205 mg/dL — ABNORMAL HIGH (ref 70–99)
Glucose-Capillary: 220 mg/dL — ABNORMAL HIGH (ref 70–99)
Glucose-Capillary: 221 mg/dL — ABNORMAL HIGH (ref 70–99)

## 2024-03-27 LAB — RENAL FUNCTION PANEL
Albumin: 3.1 g/dL — ABNORMAL LOW (ref 3.5–5.0)
Anion gap: 7 (ref 5–15)
BUN: 26 mg/dL — ABNORMAL HIGH (ref 8–23)
CO2: 31 mmol/L (ref 22–32)
Calcium: 8.7 mg/dL — ABNORMAL LOW (ref 8.9–10.3)
Chloride: 108 mmol/L (ref 98–111)
Creatinine, Ser: 0.73 mg/dL (ref 0.44–1.00)
GFR, Estimated: >60 mL/min (ref >60)
Glucose, Bld: 231 mg/dL — ABNORMAL HIGH (ref 70–99)
Phosphorus: 4 mg/dL (ref 2.5–4.6)
Potassium: 4.5 mmol/L (ref 3.5–5.1)
Sodium: 146 mmol/L — ABNORMAL HIGH (ref 135–145)

## 2024-03-27 LAB — CBC WITH DIFFERENTIAL/PLATELET
Abs Immature Granulocytes: 0.05 K/uL (ref 0.00–0.07)
Basophils Absolute: 0.1 K/uL (ref 0.0–0.1)
Basophils Relative: 1 %
Eosinophils Absolute: 0.2 K/uL (ref 0.0–0.5)
Eosinophils Relative: 2 %
HCT: 37.6 % (ref 36.0–46.0)
Hemoglobin: 11.9 g/dL — ABNORMAL LOW (ref 12.0–15.0)
Immature Granulocytes: 0 %
Lymphocytes Relative: 23 %
Lymphs Abs: 3 K/uL (ref 0.7–4.0)
MCH: 28.4 pg (ref 26.0–34.0)
MCHC: 31.6 g/dL (ref 30.0–36.0)
MCV: 89.7 fL (ref 80.0–100.0)
Monocytes Absolute: 1.2 K/uL — ABNORMAL HIGH (ref 0.1–1.0)
Monocytes Relative: 9 %
Neutro Abs: 8.7 K/uL — ABNORMAL HIGH (ref 1.7–7.7)
Neutrophils Relative %: 65 %
Platelets: 254 K/uL (ref 150–400)
RBC: 4.19 MIL/uL (ref 3.87–5.11)
RDW: 13.7 % (ref 11.5–15.5)
WBC: 13.2 K/uL — ABNORMAL HIGH (ref 4.0–10.5)
nRBC: 0 % (ref 0.0–0.2)

## 2024-03-27 MED ORDER — INSULIN ASPART 100 UNIT/ML IJ SOLN
0.0000 [IU] | INTRAMUSCULAR | Status: DC
  Administered 2024-03-27: 1 [IU] via SUBCUTANEOUS
  Administered 2024-03-27 – 2024-03-28 (×2): 3 [IU] via SUBCUTANEOUS
  Administered 2024-03-28: 2 [IU] via SUBCUTANEOUS
  Filled 2024-03-27 (×2): qty 3
  Filled 2024-03-27: qty 1
  Filled 2024-03-27: qty 2
  Filled 2024-03-27: qty 3

## 2024-03-27 MED ORDER — ACETAMINOPHEN 325 MG PO TABS
325.0000 mg | ORAL_TABLET | Freq: Once | ORAL | Status: AC
  Administered 2024-03-27: 325 mg
  Filled 2024-03-27: qty 1

## 2024-03-27 MED ORDER — BANATROL TF EN LIQD
60.0000 mL | Freq: Two times a day (BID) | ENTERAL | Status: DC
  Administered 2024-03-27 – 2024-03-29 (×5): 60 mL

## 2024-03-27 NOTE — Progress Notes (Signed)
 PT Cancellation Note  Patient Details Name: TANAYSIA BHARDWAJ MRN: [id number] DOB: 08-30-1945   Cancelled Treatment:     Pt remains unresponsive, currently MEWS 7 and febrile. Holding PT session until pt is appropriate for skilled PT session. Pt has received requested air mattress and staff is aware of 2 hour repositioning recs.    [name] 03/27/2024, 10:10 AM

## 2024-03-27 NOTE — Progress Notes (Signed)
 OT Cancellation Note  Patient Details Name: Marie Pham MRN: [id number] DOB: 03-16-1946   Cancelled Treatment:    Reason Eval/Treat Not Completed: Patient at procedure or test/ unavailable;Patient not medically ready. Pt noted with fever this AM, PM pt off unit for chest x-ray. Will hold and continue services following POC as pt available and appropriate.   Elston Slot, M.S. OTR/L  03/27/24, 3:43 PM  ascom [phone]

## 2024-03-27 NOTE — Progress Notes (Signed)
 Peri care provided to patient. BM leaked around rectal tube. Pressure injuries cleaned and foam dressing placed. Chuck pad replaced.

## 2024-03-27 NOTE — Progress Notes (Signed)
 This RN called to room by family member after portable xray taken. Family member c/o pt was out of position. RN noticed NGT bridle had fallen off and tube had been pulled out of place appx 6 cm. RN re-advanced NGT and re-secured bridle. Primary RN notified.

## 2024-03-27 NOTE — TOC Progression Note (Signed)
 Transition of Care [facility]) - Progression Note    Patient Details  Name: SCOTTLYNN LINDELL MRN: [id number] Date of Birth: May 16, 1945  [facility] CM/SW Contact  Lauraine JAYSON Carpen, LCSW Phone Number: 03/27/2024, 10:28 AM  Clinical Narrative:   [facility] will review referral. Per insurance card, she has an HMO plan. They are not typically in network with these plans but may be able to see if insurance would provide gap coverage for this. Admissions coordinator will follow up today. Per MD, potential PEG placement on Wednesday and earliest patient might discharge would be Friday.  Expected Discharge Plan: Skilled Nursing Facility Barriers to Discharge: Continued Medical Work up               Expected Discharge Plan and Services     Post Acute Care Choice: Skilled Nursing Facility Living arrangements for the past 2 months: Single Family Home                                       Social Drivers of Health (SDOH) Interventions SDOH Screenings   Food Insecurity: Patient Unable To Answer (03/18/2024)  Housing: Patient Unable To Answer (03/18/2024)  Transportation Needs: Patient Unable To Answer (03/18/2024)  Utilities: Patient Unable To Answer (03/18/2024)  Financial Resource Strain: High Risk (09/01/2023)   Received from [facility]  Social Connections: Patient Unable To Answer (03/18/2024)  Tobacco Use: Medium Risk (03/17/2024)    Readmission Risk Interventions     No data to display

## 2024-03-27 NOTE — Progress Notes (Signed)
 Patient's family did not want patient repositioned. Stated she looks comfortable and I don't want to change anything at the moment.

## 2024-03-27 NOTE — Progress Notes (Signed)
 Daily Progress Note   Patient Name: Marie Pham       Date: 03/27/2024 DOB: 07/23/1945  Age: 78 y.o. MRN#: [id number] Attending Physician: Caleen Qualia, MD Primary Care Physician: Tobie Domino, MD Admit Date: 03/17/2024 Length of Stay: 9 days  Reason for Consultation/Follow-up: Establishing goals of care  Subjective:  Subjective: Chart reviewed including personal review of notes from hospitalist, TOC, therapy, and recent palliative care note.  Labs reviewed and notable for sodium of 146.  Recent imaging personally reviewed as well.  Ms. Alvidrez is a 78 year old female with history of hypertension, back pain, arthritis, sleep apnea, depression, asthma/bronchitis currently admitted with acute stroke.  She remains lethargic and is minimally responsive.  Her son is at the bedside during my encounter.  [name] and I discussed that Ms. Mullinix remains minimally responsive and it is unclear if she will regain functional status.  Family feels she needs time to see if she improves with more time and continued nutritional support.  We discussed continuing to follow her nutrition, cognition, and functional status as indicators on potential for recovery.  Son confirms plan is to have PEG placed potentially on Wednesday of this week and then they are hopeful that she can be transition to [facility] in [place] where her daughter works.  When asked about long-term plans if she does not make functional recovery, he reports that as a bridge still have to cross when they get there.  Right now, they are focused on working through issues with her Medicare/Medicaid and securing her a bed to see how she continues to progress with continue clinical care.  I also spoke with her daughter, [name], via phone.  She reports she has not yet gotten more information regarding what she needs to do to pursue long-term care bed.  She appreciates any help that she can get regarding this as she notes again that they are having  difficulty working with getting her Medicare reenrolled as it is open enrollment.  Review of Systems Unable to obtain  Objective:   Vital Signs:  BP (!) 142/58 (BP Location: Right Arm)   Pulse 96   Temp (!) 100.6 F (38.1 C) (Axillary)   Resp (!) 30   Ht 5' 5 (1.651 m)   Wt 85.8 kg   SpO2 95%   BMI 31.48 kg/m   Physical Exam: General: No distress, somnolent, does not respond to verbal or tactile stimulation Eyes: conjunctiva clear, anicteric sclera HENT: normocephalic, atraumatic, tacky mucous membranes Cardiovascular: RRR Respiratory: no increased work of breathing noted, not in respiratory distress Abdomen: Soft Skin: no rashes or lesions on visible skin Neuro: Minimally responsive, does not follow commands  Imaging: @IMAGES @  I personally reviewed recent imaging.   Assessment & Plan:   Assessment:  Recommendations/Plan: # Complex medical decision making/goals of care:  - Continue current care plan.  Family is invested in plan to have PEG tube placed for longer-term nutritional support to see if she can make functional recovery following acute CVA.  - Family appreciates any support that can be given regarding financial advisor to assist with enrollment in Medicare/Medicaid.  Received message to this morning that somebody will reach out to them today and I passed this information along to family.  - Letter completed per request of financial navigator outlining that she is not able to participate in conversation, interview, or phone calls at this time due to sequelae of stroke  -  Code Status: Do not attempt resuscitation (DNR) PRE-ARREST INTERVENTIONS DESIRED  Prognosis: Guarded to poor  # Psychosocial Support:  - Family including son and daughter  # Discharge Planning: To Be Determined- ?  LTC at [facility]  -  Discussed with: Family including son and daughter via phone  Thank you for allowing the palliative care team to participate in the care Merlynn JONELLE Ada.  Amaryllis Meissner, MD Palliative Care Provider PMT # [phone]  If patient remains symptomatic despite maximum doses, please call PMT at [phone] between 0700 and 1900. Outside of these hours, please call attending, as PMT does not have night coverage.   I personally spent a total of 55 minutes in the care of the patient today including preparing to see the patient, getting/reviewing separately obtained history, performing a medically appropriate exam/evaluation, counseling and educating, referring and communicating with other health care professionals, documenting clinical information in the EHR, and coordinating care.

## 2024-03-27 NOTE — Progress Notes (Signed)
 Nutrition Follow-up  DOCUMENTATION CODES:   Obesity unspecified  INTERVENTION:   -TF via NGT:    Osmolite 1.5 @ 50 ml/hr    60 ml Prosource TF20 daily   200 ml free water  flush every 6 hours per MD   Tube feeding regimen provides 1880 kcal (100% of needs), 95 grams of protein, and 914 ml of H2O. Total free water : 1714 ml daily  -Continue MVI with minerals daily via tube -Continue 100 mg thiamine  daily x 7 days via tube -500 mg vitamin C BID via tube -220 mg zinc sulfate daily x 14 days via tube -Monitor Mg, K, and Phos and replete as needed secondary to high refeeding risk  -Per palliative care notes, plan for full scope care; plan for PEG placement -Consult placed to diabetes coordinator for persistent hyperglycemia (3 instances of hyperglycemia within 24 hours)   NUTRITION DIAGNOSIS:   Inadequate oral intake related to inability to eat, dysphagia as evidenced by NPO status.  Ongoing  GOAL:   Patient will meet greater than or equal to 90% of their needs  Met with TF  MONITOR:   Diet advancement, TF tolerance  REASON FOR ASSESSMENT:   Consult Enteral/tube feeding initiation and management  ASSESSMENT:   78 y.o. female with medical history significant for Hypertension, arthritis, chronic back pain, ambulance at baseline with a walker being admitted for an acute CVA.  11/22- NPO with ice chips 11/23- bedside NGT placement attempted by RN, unsuccessful; per SLP unable to assess due to lethargy 11/24- NGT placed via fluoroscopy (tip of tube in lumen per PA note); TF initiated 11/24- s/p BSE- NPO 11/25- rapid response called secondary to lethargy and tachypenia, SLP signed off 11/26- s/p EEG- consistent with a generalized nonspecific cerebral dysfunction, though triphasic waves are nonspecific and they can be associated with toxic/metabolic etiologies of encephalopathy; no seizure activity noted 11/30- rectal tube placed secondary to diarrhea  Reviewed I/O's: +2.9  L x 24 hours and +15.3 L since admission  UOP: 500 ml x 24 hours  Per CWOCN notes, patient with deep tissue pressure injury to sacrum and bilateral buttocks; dark discoloration evolving with red moist areas to buttocks and linear red moist area coccyx.   Patient and family member sleeping soundly at time of visit. Patient remains NPO and receiving sole source nutrition support from NGT: Osmolite 1.5 infusing at goal rate of 50 ml/hr.   Rectal tube placed yesterday due to diarrhea. Patient on antibiotics, which may be contributing to diarrhea. RD will also add fiber supplement to help minimize diarrhea.   Palliative care following for goals of care; patient family still desire goals of care. Per RN, plan for PEG on Wednesday (03/30/24) due to need to hold plavix  prior to procedure. Per TOC notes, plan for SNF placement at discharge (earlier possible discharge date is on Friday, 03/31/24).   Weight has ranged from 83.5-87.1 kg since admission.   Medications reviewed and include vitamin C, vitamin B-12, lovenox , rocephin , and zinc sulfate.   Labs reviewed: CBGS: 173-205 (inpatient orders for glycemic control are 0-9 units insulin aspart every 4 hours). Consult placed to DM coordinator due to 3 instances of hyperglycemia in the past 24 hours; patient may benefit from tube feeding coverage.    Diet Order:   Diet Order             Diet NPO time specified  Diet effective midnight           Diet NPO time specified  Diet effective now                   EDUCATION NEEDS:   Education needs have been addressed  Skin:  Skin Assessment: Reviewed RN Assessment  Last BM:  03/24/24 (type 7)  Height:   Ht Readings from Last 1 Encounters:  03/17/24 5' 5 (1.651 m)    Weight:   Wt Readings from Last 1 Encounters:  03/26/24 85.8 kg    Ideal Body Weight:  56.8 kg  BMI:  Body mass index is 31.48 kg/m.  Estimated Nutritional Needs:   Kcal:  1800-2000  Protein:  95-110  grams  Fluid:  1.8-2.0 L    Margery ORN, RD, LDN, CDCES Registered Dietitian III Certified Diabetes Care and Education Specialist If unable to reach this RD, please use RD Inpatient group chat on secure chat between hours of 8am-4 pm daily

## 2024-03-27 NOTE — Progress Notes (Signed)
 Patient had an active order for a chest X-ray due to elevated respiratory rate and temperature. Following completion of the X-ray, the patient's family notified nursing staff that the Dobhoff tube appeared to have been pulled out a few inches. Charge nurse responded to bedside, adjusted the Dobhoff tube, and re-secured it.  Approximately 30 minutes later, the family alerted staff again, reporting a small loop of tubing visible in the patient's mouth. Upon assessment, this RN observed the tube loop and immediately stopped the tube feeding. Dobhoff tube was removed in its entirety. The patient grimaced and briefly opened eyes during removal.  Medical Doctor notified. New order received for Dobhoff tube replacement via Interventional Radiology.

## 2024-03-27 NOTE — Progress Notes (Signed)
 MD aware of respiratory rate and temperature. Blood cultures and chest x-ray ordered.

## 2024-03-27 NOTE — Progress Notes (Deleted)
 MD aware of temperature. Blood cultures ordered.

## 2024-03-27 NOTE — Progress Notes (Addendum)
 Progress Note    Marie Pham  FMW:[id number] DOB: Jul 03, 1945  DOA: 03/17/2024 PCP: Tobie Domino, MD      Brief Narrative:    Medical records reviewed and are as summarized below:  Marie Pham is a 78 y.o. female with medical history significant for hypertension, chronic back pain, arthritis, sleep apnea, depression, asthma/bronchitis, ambulates with a walker at baseline who presented to the hospital because of poor oral intake and speech difficulties.  Family had tried to communicate with her and she appeared to understand what her son was saying but she could not respond.  Onset or duration of speech difficulty is not clear.  In the ED, apparently patient indicated that her back hurts.  Vitals in the ED: Temperature 98.7 F, respiratory rate 18, pulse 115, BP 120/99 oxygen saturation 95% on room air.  Troponins negative, mildly elevated D-dimer 0.92. EKG shows sinus tachycardia, right bundle branch block, prolonged QTc at 494  CT angiogram of head and neck showed acute left basal ganglia infarct.  Chest x-ray 1. Linear atelectasis in the left base and right mid lung, which may contribute to shortness of breath. 2. No acute cardiopulmonary process identified.  She was admitted to the hospital for acute stroke.  11/26: Patient very somnolent and difficult to arouse, EEG was also ordered.  Family would like to continue full scope of care, might need a PEG tube placement.  Currently being fed with corTrack.  11/27: No change in mentation, remained minimally responsive to obnoxious stimuli.  Repeat ABG with mild hypercarbia at 49 which does not explain this encephalopathy, ammonia mildly elevated at 37, we will give her a trial of bicarb, also adding lactulose .  Repeating brain MRI and neurology was reconsulted.  1 episode of A-fib was mentioned on her monitor on 11/23, talked with central monitoring system and it was PACs, she never had any documented atrial  fibrillation.  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  IR was consulted for PEG tube placement and Plavix  need to be held for 5 days, most likely will get her PEG on 03/29/2024.  TOC to work on placement.  Daughter works in a facility in [place] and trying to get her there. Family would like to continue current level of care to see if there is any improvement in her mental status over the next few months before thinking about transitioning to hospice.  Neurology is not very hopeful.  Only time will tell.  Repeat MRI yesterday with progressed bilateral ACA territory infarct with cytotoxic edema, left greater than right, no hemorrhagic transformation or mass effect.  Also noted evolution of left caudate infarct with possible petechial hemorrhage but no malignant hemorrhagic transformation or mass effect.  No new vascular territory ischemia.  Developed diarrhea so holding MiraLAX  and lactulose .  11/29: Remained unresponsive, family concern of developing pressure injuries and nurses are not changing position every 2 hourly as recommended-discussed with nursing staff.  Air mattress was also ordered.  Persistent hypernatremia at 150, increasing the frequency of free water  to Q4 hourly.  Patient developed low-grade fever overnight, had an aspiration episode on 11/27-starting on ceftriaxone  and Flagyl.  11/30: Hemodynamically stable, improving hyponatremia now at 148.  Patient started breathing a little heavy when did the sternal rub, apparently she did open her eyes and look at daughter earlier.  12/1: Patient became febrile again with temperature of 101.  Blood cultures ordered.  Repeat chest x-ray and abdominal x-ray ordered.  Having some diarrhea but it was not watery. Feeding tube fell off-ordered another Dobbhoff placement. ID was also consulted for recurrent fever.  Patient with very guarded prognosis.   Assessment/Plan:   Principal Problem:   Acute  CVA (cerebrovascular accident) (HCC) Active Problems:   AKI (acute kidney injury)   Hypernatremia   SIRS (systemic inflammatory response syndrome) (HCC)   Abnormal EKG   Essential hypertension   Severe aortic stenosis   Pneumonitis   Body mass index is 31.48 kg/m.  (Class I obesity)  Acute stroke with lethargy, right facial droop, right hemiparesis and aphasia: Persistent encephalopathy Patient with bilateral frontal lobe and basal ganglia infarct. 2D echo showed EF estimated at 60 to 65%, grade 3 diastolic dysfunction, severe aortic valve stenosis. Continue low-dose aspirin  and Lipitor  via NG tube Holding Plavix  for 5 days, need PEG tube placed on 03/29/2024 PT and OT recommended discharge to SNF. Patient remained unresponsive S/p NG tube placement by IR on 03/20/2024 Continue enteral nutrition via NG tube-tube was accidentally removed-ordered replacement EEG with generalized cortical dysfunction, no epileptiform discharges. ABG with mild hypercarbia and mildly elevated ammonia. - Repeat brain MRI with progress bilateral ACA territory infarct, cytogenic edema with no mass effect or hemorrhagic transformation, please see the full report.  SIRS, tachycardia, tachypnea, leukocytosis, pneumonitis:  Leukocytosis resolved Repeat chest x-ray on 03/21/2024 because rapid response was called in the early hours of the morning for tachypnea.  Chest x-ray showed low lung volumes with bronchovascular crowding but there was no focal consolidation.  CTA chest on11/22/2025 did not show any evidence of pulmonary embolism but there is evidence of bilateral pneumonitis.  This is probably due to aspiration.   - Completed a 5-day course of antibiotics with ceftriaxone  and Zithromax   11/29: Became febrile again, had another episode of aspiration on 11/27-starting on ceftriaxone  and Flagyl.  12/1: Another episode of fever above 101.  Repeat chest x-ray, abdominal x-ray, CBC with differential and blood  cultures ordered.  ID was consulted - Continuing ceftriaxone  and Flagyl for now  Severe aortic stenosis, grade 3 diastolic dysfunction: Will need outpatient follow-up with cardiologist.  Hypernatremia, dehydration: Improved.  Sodium down from 153-146.>147.145>>150>.150>>148>.146 - Continue with current frequency of free water  -Continue to monitor sodium AKI ruled out.  Hypertension: Blood pressure within goal  Continue carvedilol .  Chronic back pain/lumbar radiculitis - Pain management - Currently patient is unresponsive   Plan of care was discussed with [name] (son), they want to keep her full scope of care to see the trajectory. They think that care was delayed in the ED and patient could have been a candidate for tPA.  They said they have read a lot about strokes. I explained that it was documented in the chart that patient was last known normal over 24 hours, and that given tPA beyond the recommended timeframe could potentially have serious complications but with little benefit.   Diet Order             Diet NPO time specified  Diet effective midnight           Diet NPO time specified  Diet effective now                  Consultants: Neurologist  Procedures: NG tube placement by IR on 03/20/2024  Medications:    vitamin C  500 mg Per Tube BID   aspirin   81 mg Per NG tube Daily   atorvastatin   80 mg Per NG tube Daily  carvedilol   12.5 mg Per NG tube BID WC   cyanocobalamin   1,000 mcg Intramuscular Q0600   enoxaparin  (LOVENOX ) injection  40 mg Subcutaneous Q24H   feeding supplement (PROSource TF20)  60 mL Per Tube Daily   fiber supplement (BANATROL TF)  60 mL Per Tube BID   free water   200 mL Per Tube Q4H   insulin aspart  0-9 Units Subcutaneous Q4H   latanoprost  1 drop Both Eyes QHS   losartan   25 mg Per Tube Daily   multivitamin with minerals  1 tablet Per Tube Daily   mouth rinse  15 mL Mouth Rinse 4 times per day   zinc sulfate (50mg  elemental zinc)  220  mg Per Tube Daily   Continuous Infusions:  cefTRIAXone  (ROCEPHIN )  IV 1 g (03/27/24 1429)   feeding supplement (OSMOLITE 1.5 CAL) 50 mL/hr at 03/27/24 0402   metronidazole 500 mg (03/27/24 1321)     Anti-infectives (From admission, onward)    Start     Dose/Rate Route Frequency Ordered Stop   03/25/24 1330  metroNIDAZOLE (FLAGYL) IVPB 500 mg        500 mg 100 mL/hr over 60 Minutes Intravenous Every 12 hours 03/25/24 1214     03/25/24 1300  cefTRIAXone  (ROCEPHIN ) 1 g in sodium chloride  0.9 % 100 mL IVPB        1 g 200 mL/hr over 30 Minutes Intravenous Every 24 hours 03/25/24 1214     03/19/24 1630  azithromycin  (ZITHROMAX ) 500 mg in sodium chloride  0.9 % 250 mL IVPB  Status:  Discontinued        500 mg 250 mL/hr over 60 Minutes Intravenous Every 24 hours 03/19/24 1533 03/22/24 1542   03/18/24 1045  cefTRIAXone  (ROCEPHIN ) 1 g in sodium chloride  0.9 % 100 mL IVPB  Status:  Discontinued        1 g 200 mL/hr over 30 Minutes Intravenous Every 24 hours 03/18/24 0945 03/22/24 1542       Family Communication/Anticipated D/C date and plan/Code Status   DVT prophylaxis: enoxaparin  (LOVENOX ) injection 40 mg Start: 03/18/24 1038     Code Status: Do not attempt resuscitation (DNR) PRE-ARREST INTERVENTIONS DESIRED  Family Communication: Discussed with son at bedside  Disposition Plan: Plan to discharge to SNF   Status is: Inpatient Remains inpatient appropriate because: Acute stroke and persistent encephalopathy   Subjective:  Patient remained unresponsive, little tachypneic today.  Diarrhea seems improving.  Objective:    Vitals:   03/27/24 0854 03/27/24 1000 03/27/24 1146 03/27/24 1222  BP: (!) 158/68   (!) 142/58  Pulse: (!) 109   96  Resp: (!) 30     Temp: (!) 101.7 F (38.7 C) 99.8 F (37.7 C) (!) 100.6 F (38.1 C)   TempSrc: Axillary Axillary Axillary   SpO2: 93%   95%  Weight:      Height:       No data found.   Intake/Output Summary (Last 24 hours) at  03/27/2024 1508 Last data filed at 03/27/2024 1143 Gross per 24 hour  Intake 1628.15 ml  Output 500 ml  Net 1128.15 ml   Filed Weights   03/24/24 0449 03/25/24 0345 03/26/24 0456  Weight: 87.1 kg 85.1 kg 85.8 kg    Exam: General.  Obese and frail lady, in no acute distress. Pulmonary.  Lungs clear bilaterally, mildly increased work of breathing CV.  Regular rate and rhythm, no JVD, rub or murmur. Abdomen.  Soft, nontender, nondistended, BS positive. CNS.  Unresponsive,  Extremities.  No edema,  pulses intact and symmetrical.    Data Reviewed:   I have personally reviewed following labs and imaging studies:  Labs: Labs show the following:   Basic Metabolic Panel: Recent Labs  Lab 03/21/24 0452 03/22/24 0426 03/23/24 0940 03/24/24 0457 03/25/24 0535 03/26/24 0519 03/27/24 1210  NA 146* 147* 145 150* 150* 148* 146*  K 4.0 4.5 4.8 4.7 4.7 4.4 4.5  CL 110 111 110 111 110 109 108  CO2 28 26 28  32 34* 32 31  GLUCOSE 153* 150* 196* 112* 161* 201* 231*  BUN 19 17 17 17  25* 28* 26*  CREATININE 0.88 0.65 0.65 0.67 0.81 0.73 0.73  CALCIUM  9.0 9.0 8.9 9.3 9.2 8.9 8.7*  MG 2.4 2.5*  --   --   --   --   --   PHOS 3.4 3.6 3.5 4.1 4.1 3.2 4.0   GFR Estimated Creatinine Clearance: 65.7 mL/min (by C-G formula based on SCr of 0.73 mg/dL). Liver Function Tests: Recent Labs  Lab 03/23/24 0940 03/24/24 0457 03/25/24 0535 03/26/24 0519 03/27/24 1210  ALBUMIN 3.1* 3.2* 3.2* 3.1* 3.1*   No results for input(s): LIPASE, AMYLASE in the last 168 hours.  Recent Labs  Lab 03/23/24 1028  AMMONIA 37*   Coagulation profile No results for input(s): INR, PROTIME in the last 168 hours.  CBC: Recent Labs  Lab 03/21/24 0452 03/22/24 0426  WBC 13.1* 9.9  HGB 12.4 12.5  HCT 39.1 39.5  MCV 90.1 89.6  PLT 248 238   Cardiac Enzymes: No results for input(s): CKTOTAL, CKMB, CKMBINDEX, TROPONINI in the last 168 hours. BNP (last 3 results) No results for input(s):  PROBNP in the last 8760 hours. CBG: Recent Labs  Lab 03/26/24 2048 03/27/24 0031 03/27/24 0403 03/27/24 0946 03/27/24 1218  GLUCAP 198* 189* 160* 205* 202*   D-Dimer: No results for input(s): DDIMER in the last 72 hours.  Hgb A1c: No results for input(s): HGBA1C in the last 72 hours.  Lipid Profile: No results for input(s): CHOL, HDL, LDLCALC, TRIG, CHOLHDL, LDLDIRECT in the last 72 hours.  Thyroid  function studies: No results for input(s): TSH, T4TOTAL, T3FREE, THYROIDAB in the last 72 hours.  Invalid input(s): FREET3  Anemia work up: No results for input(s): VITAMINB12, FOLATE, FERRITIN, TIBC, IRON, RETICCTPCT in the last 72 hours.  Sepsis Labs: Recent Labs  Lab 03/21/24 0452 03/22/24 0426  WBC 13.1* 9.9    Microbiology No results found for this or any previous visit (from the past 240 hours).  Procedures and diagnostic studies:  No results found.  Time spent.  50 minutes  This record has been created using Conservation officer, historic buildings. Errors have been sought and corrected,but may not always be located. Such creation errors do not reflect on the standard of care.    LOS: 9 days   Zavier Canela  [employer] Loss Adjuster on [email or web address]. If 7PM-7AM, please contact night-coverage at [email or web address]  03/27/2024, 3:08 PM

## 2024-03-27 NOTE — Consult Note (Signed)
 NAME: TRISTY UDOVICH  DOB: 1945/09/28  MRN: [id number]  Date/Time: 03/27/2024 4:32 PM  REQUESTING PROVIDER: Dr.Amin Subjective:  REASON FOR CONSULT: fever ?No history from patient , chart reviewed in detail- spoke to family at bed side ( granddaughter and daughters) LUIS SAMI is a 78 y.o. with a history of hypertension, chronic back pain, arthritis, sleep apnea, depression, asthma/bronchitis presented to the ED on 11/21/25with weakness, not talking  and poor oral intake. Was also c/o back pain Vitals in the ED BP 129/99 , pulse 115, Temp 98.7 and  RR 25 Labs revealed  Latest Reference Range & Units 03/17/24  WBC 4.0 - 10.5 K/uL 9.4  Hemoglobin 12.0 - 15.0 g/dL 84.5 (H)  HCT 63.9 - 53.9 % 47.4 (H)  Platelets 150 - 400 K/uL 363  Creatinine 0.44 - 1.00 mg/dL 8.95 (H)  Na 852, EKG RBBB and ST-T inversion concerning for ischemia CT angiogram of head and neck showed acute left basal ganglia infarct.  Also showed multiple areas of varying stenosis in the PCA and MCA area MRI brain showed ACA infarcts left > rt Seen by neurologist on 11/22 and they recommended BP management , as[irin, NOP, IV fluids CTA chest showed b/l pneumonitis concerning for aspiration  Repeat MRI 11/26 with progressed bilateral ACA territory infarct with cytotoxic edema, left greater than right, no hemorrhagic transformation or mass effect. Also noted evolution of left caudate infarct with possible petechial hemorrhage but no malignant hemorrhagic transformation or mass effect. No new vascular territory ischemia.   I am asked to see patient for fever    Past Medical History:  Diagnosis Date   Asthma    Depression    Hypertension    Sleep apnea     Past Surgical History:  Procedure Laterality Date   ABDOMINAL HYSTERECTOMY     COLON SURGERY     EYE SURGERY Left    HERNIA REPAIR     Left hip hemiarthroplasty Social History   Socioeconomic History   Marital status: Widowed    Spouse name: Not on file    Number of children: Not on file   Years of education: Not on file   Highest education level: Not on file  Occupational History   Not on file  Tobacco Use   Smoking status: Former    Current packs/day: 0.00    Types: Cigarettes    Quit date: 10/02/1994    Years since quitting: 29.5   Smokeless tobacco: Never  Substance and Sexual Activity   Alcohol use: No    Alcohol/week: 0.0 standard drinks of alcohol   Drug use: No   Sexual activity: Never  Other Topics Concern   Not on file  Social History Narrative   Not on file   Social Drivers of Health   Financial Resource Strain: High Risk (09/01/2023)   Received from [facility]   Overall Financial Resource Strain (CARDIA)    Difficulty of Paying Living Expenses: Very hard  Food Insecurity: Patient Unable To Answer (03/18/2024)   Hunger Vital Sign    Worried About Running Out of Food in the Last Year: Patient unable to answer    Ran Out of Food in the Last Year: Patient unable to answer  Transportation Needs: Patient Unable To Answer (03/18/2024)   PRAPARE - Transportation    Lack of Transportation (Medical): Patient unable to answer    Lack of Transportation (Non-Medical): Patient unable to answer  Physical Activity: Not on file  Stress: Not on  file  Social Connections: Patient Unable To Answer (03/18/2024)   Social Connection and Isolation Panel    Frequency of Communication with Friends and Family: Patient unable to answer    Frequency of Social Gatherings with Friends and Family: Patient unable to answer    Attends Religious Services: Patient unable to answer    Active Member of Clubs or Organizations: Patient unable to answer    Attends Banker Meetings: Patient unable to answer    Marital Status: Patient unable to answer  Intimate Partner Violence: Patient Unable To Answer (03/18/2024)   Humiliation, Afraid, Rape, and Kick questionnaire    Fear of Current or Ex-Partner: Patient unable to answer     Emotionally Abused: Patient unable to answer    Physically Abused: Patient unable to answer    Sexually Abused: Patient unable to answer    Family History  Problem Relation Age of Onset   Hypertension Sister    Alcohol abuse Brother    Hypertension Brother    Diabetes Brother    Allergies  Allergen Reactions   Lansoprazole Nausea And Vomiting   Penicillins Hives   Prozac [Fluoxetine] Hives   Bupropion Rash   I? Current Facility-Administered Medications  Medication Dose Route Frequency Provider Last Rate Last Admin   acetaminophen  (TYLENOL ) tablet 650 mg  650 mg Oral Q4H PRN Duncan, Hazel V, MD   650 mg at 03/24/24 1814   Or   acetaminophen  (TYLENOL ) 160 MG/5ML solution 650 mg  650 mg Per Tube Q4H PRN Duncan, Hazel V, MD   650 mg at 03/27/24 9094   Or   acetaminophen  (TYLENOL ) suppository 650 mg  650 mg Rectal Q4H PRN Duncan, Hazel V, MD   650 mg at 03/24/24 0416   ascorbic acid (VITAMIN C) tablet 500 mg  500 mg Per Tube BID Amin, Sumayya, MD   500 mg at 03/27/24 9143   aspirin  chewable tablet 81 mg  81 mg Per NG tube Daily Jens Durand, MD   81 mg at 03/27/24 0857   atorvastatin  (LIPITOR ) tablet 80 mg  80 mg Per NG tube Daily Jens Durand, MD   80 mg at 03/27/24 0857   carvedilol  (COREG ) tablet 12.5 mg  12.5 mg Per NG tube BID WC Jens Durand, MD   12.5 mg at 03/27/24 0857   cefTRIAXone  (ROCEPHIN ) 1 g in sodium chloride  0.9 % 100 mL IVPB  1 g Intravenous Q24H Amin, Sumayya, MD 200 mL/hr at 03/27/24 1429 1 g at 03/27/24 1429   cyanocobalamin  (VITAMIN B12) injection 1,000 mcg  1,000 mcg Intramuscular Q0600 Amin, Sumayya, MD   1,000 mcg at 03/27/24 0535   enoxaparin  (LOVENOX ) injection 40 mg  40 mg Subcutaneous Q24H Michaela Aisha SQUIBB, MD   40 mg at 03/27/24 0857   feeding supplement (OSMOLITE 1.5 CAL) liquid 1,000 mL  1,000 mL Per Tube Continuous Ayiku, Bernard, MD 50 mL/hr at 03/27/24 0402 Infusion Verify at 03/27/24 0402   feeding supplement (PROSource TF20) liquid 60  mL  60 mL Per Tube Daily Jens Durand, MD   60 mL at 03/27/24 0857   fiber supplement (BANATROL TF) liquid 60 mL  60 mL Per Tube BID Caleen Qualia, MD   60 mL at 03/27/24 1430   free water  200 mL  200 mL Per Tube Q4H Caleen Qualia, MD   200 mL at 03/27/24 1143   insulin aspart (novoLOG) injection 0-9 Units  0-9 Units Subcutaneous Q4H Amin, Sumayya, MD  latanoprost (XALATAN) 0.005 % ophthalmic solution 1 drop  1 drop Both Eyes QHS Amin, Sumayya, MD   1 drop at 03/26/24 2115   lip balm (CARMEX) ointment 1 Application  1 Application Topical PRN Amin, Sumayya, MD   1 Application at 03/26/24 1610   losartan  (COZAAR ) tablet 25 mg  25 mg Per Tube Daily Amin, Sumayya, MD   25 mg at 03/27/24 0857   metroNIDAZOLE (FLAGYL) IVPB 500 mg  500 mg Intravenous Q12H Amin, Sumayya, MD 100 mL/hr at 03/27/24 1321 500 mg at 03/27/24 1321   multivitamin with minerals tablet 1 tablet  1 tablet Per Tube Daily Jens Durand, MD   1 tablet at 03/27/24 9142   Oral care mouth rinse  15 mL Mouth Rinse 4 times per day Caleen Qualia, MD   15 mL at 03/27/24 1143   Oral care mouth rinse  15 mL Mouth Rinse PRN Amin, Sumayya, MD       zinc sulfate (50mg  elemental zinc) capsule 220 mg  220 mg Per Tube Daily Amin, Sumayya, MD   220 mg at 03/27/24 0857     Abtx:  Anti-infectives (From admission, onward)    Start     Dose/Rate Route Frequency Ordered Stop   03/25/24 1330  metroNIDAZOLE (FLAGYL) IVPB 500 mg        500 mg 100 mL/hr over 60 Minutes Intravenous Every 12 hours 03/25/24 1214     03/25/24 1300  cefTRIAXone  (ROCEPHIN ) 1 g in sodium chloride  0.9 % 100 mL IVPB        1 g 200 mL/hr over 30 Minutes Intravenous Every 24 hours 03/25/24 1214     03/19/24 1630  azithromycin  (ZITHROMAX ) 500 mg in sodium chloride  0.9 % 250 mL IVPB  Status:  Discontinued        500 mg 250 mL/hr over 60 Minutes Intravenous Every 24 hours 03/19/24 1533 03/22/24 1542   03/18/24 1045  cefTRIAXone  (ROCEPHIN ) 1 g in sodium chloride  0.9 % 100 mL  IVPB  Status:  Discontinued        1 g 200 mL/hr over 30 Minutes Intravenous Every 24 hours 03/18/24 0945 03/22/24 1542       REVIEW OF SYSTEMS:  NA Objective:  VITALS:  BP (!) 142/58 (BP Location: Right Arm)   Pulse 96   Temp (!) 100.6 F (38.1 C) (Axillary)   Resp (!) 30   Ht 5' 5 (1.651 m)   Wt 85.8 kg   SpO2 95%   BMI 31.48 kg/m   PHYSICAL EXAM:  General: unresponsive On oepning her eyelids her eyeball moves from side eto side Head: Normocephalic, without obvious abnormality, atraumatic. NG tube Eyes: Conjunctivae clear, anicteric sclerae. Pupils are equal ENTcannot examine Neck:, symmetrical, no adenopathy, thyroid : non tender no carotid bruit and no JVD. Back: did not examine Lungs:b/l air entry basal crepts Heart: Tachycardia 2/6 systolic murmur Abdomen: Soft,  distended. Bowel sounds normal. No masses Extremities: minimal edema ankles Skin: Nlimited examination  Neurologic: cannot assess No babinski Pertinent Labs Lab Results CBC    Component Value Date/Time   WBC 9.9 03/22/2024 0426   RBC 4.41 03/22/2024 0426   HGB 12.5 03/22/2024 0426   HGB 13.0 10/25/2011 1617   HCT 39.5 03/22/2024 0426   HCT 38.7 10/25/2011 1617   PLT 238 03/22/2024 0426   PLT 265 10/25/2011 1617   MCV 89.6 03/22/2024 0426   MCV 85 10/25/2011 1617   MCH 28.3 03/22/2024 0426   MCHC 31.6 03/22/2024 0426  RDW 13.8 03/22/2024 0426   RDW 14.7 (H) 10/25/2011 1617   LYMPHSABS 2.8 03/17/2024 1624   LYMPHSABS 1.1 05/21/2011 0505   MONOABS 0.7 03/17/2024 1624   MONOABS 0.2 05/21/2011 0505   EOSABS 0.0 03/17/2024 1624   EOSABS 0.0 05/21/2011 0505   BASOSABS 0.0 03/17/2024 1624   BASOSABS 0.0 05/21/2011 0505       Latest Ref Rng & Units 03/27/2024   12:10 PM 03/26/2024    5:19 AM 03/25/2024    5:35 AM  CMP  Glucose 70 - 99 mg/dL 768  798  838   BUN 8 - 23 mg/dL 26  28  25    Creatinine 0.44 - 1.00 mg/dL 9.26  9.26  9.18   Sodium 135 - 145 mmol/L 146  148  150   Potassium  3.5 - 5.1 mmol/L 4.5  4.4  4.7   Chloride 98 - 111 mmol/L 108  109  110   CO2 22 - 32 mmol/L 31  32  34   Calcium  8.9 - 10.3 mg/dL 8.7  8.9  9.2       Microbiology: [facility] sent today  IMAGING RESULTS:  I have personally reviewed the films Linear scarring left lower lobe EKG 03/20/24- RBBB? Impression/Recommendation 78 yr female in the hospital since 11/21 Extensive CVA  CVA- b/l ACA involvement unresponsive Multiple other infarcts   Fever- could be from aspiration pneumonitis' Central fever possible R/o medication induced fever R/o cdiff -  No vegetations on the valves on 2 d echo 11/22  need to check her buttock wound- this was stage II a week ago  Blood culture, CXR done Pt is currently on ceftixone and metronidazole   Adjust as things evolve  Hypernatremia- resolved Pt on NG feeds  Hyperglycemia   Aortic stenosis H/o back pain- may need imaging if fever persist  Dsicussed the management with family and her nurse ?  _______________________________________________

## 2024-03-27 NOTE — Progress Notes (Signed)
 Patient returned from IR with new dobhoff tube. Tube taped at right nare at 78. MD oder to resume feeding and flushes as prior.

## 2024-03-28 DIAGNOSIS — I69319 Unspecified symptoms and signs involving cognitive functions following cerebral infarction: Secondary | ICD-10-CM | POA: Diagnosis not present

## 2024-03-28 DIAGNOSIS — Z515 Encounter for palliative care: Secondary | ICD-10-CM | POA: Diagnosis not present

## 2024-03-28 DIAGNOSIS — R509 Fever, unspecified: Secondary | ICD-10-CM | POA: Diagnosis not present

## 2024-03-28 DIAGNOSIS — N179 Acute kidney failure, unspecified: Secondary | ICD-10-CM | POA: Diagnosis not present

## 2024-03-28 DIAGNOSIS — I639 Cerebral infarction, unspecified: Secondary | ICD-10-CM | POA: Diagnosis not present

## 2024-03-28 DIAGNOSIS — J690 Pneumonitis due to inhalation of food and vomit: Secondary | ICD-10-CM | POA: Diagnosis not present

## 2024-03-28 LAB — CBC WITH DIFFERENTIAL/PLATELET
Abs Immature Granulocytes: 0.08 K/uL — ABNORMAL HIGH (ref 0.00–0.07)
Basophils Absolute: 0.1 K/uL (ref 0.0–0.1)
Basophils Relative: 1 %
Eosinophils Absolute: 0.3 K/uL (ref 0.0–0.5)
Eosinophils Relative: 2 %
HCT: 38.5 % (ref 36.0–46.0)
Hemoglobin: 12.2 g/dL (ref 12.0–15.0)
Immature Granulocytes: 1 %
Lymphocytes Relative: 19 %
Lymphs Abs: 2.9 K/uL (ref 0.7–4.0)
MCH: 28.7 pg (ref 26.0–34.0)
MCHC: 31.7 g/dL (ref 30.0–36.0)
MCV: 90.6 fL (ref 80.0–100.0)
Monocytes Absolute: 1.1 K/uL — ABNORMAL HIGH (ref 0.1–1.0)
Monocytes Relative: 7 %
Neutro Abs: 10.6 K/uL — ABNORMAL HIGH (ref 1.7–7.7)
Neutrophils Relative %: 70 %
Platelets: 265 K/uL (ref 150–400)
RBC: 4.25 MIL/uL (ref 3.87–5.11)
RDW: 13.7 % (ref 11.5–15.5)
WBC: 15 K/uL — ABNORMAL HIGH (ref 4.0–10.5)
nRBC: 0 % (ref 0.0–0.2)

## 2024-03-28 LAB — RENAL FUNCTION PANEL
Albumin: 3.2 g/dL — ABNORMAL LOW (ref 3.5–5.0)
Anion gap: 9 (ref 5–15)
BUN: 21 mg/dL (ref 8–23)
CO2: 29 mmol/L (ref 22–32)
Calcium: 8.9 mg/dL (ref 8.9–10.3)
Chloride: 107 mmol/L (ref 98–111)
Creatinine, Ser: 0.68 mg/dL (ref 0.44–1.00)
GFR, Estimated: >60 mL/min (ref >60)
Glucose, Bld: 227 mg/dL — ABNORMAL HIGH (ref 70–99)
Phosphorus: 3.8 mg/dL (ref 2.5–4.6)
Potassium: 4.8 mmol/L (ref 3.5–5.1)
Sodium: 145 mmol/L (ref 135–145)

## 2024-03-28 LAB — BLOOD CULTURE ID PANEL (REFLEXED) - BCID2

## 2024-03-28 LAB — C DIFFICILE (CDIFF) QUICK SCRN (NO PCR REFLEX)
C Diff antigen: NEGATIVE
C Diff interpretation: NOT DETECTED
C Diff toxin: NEGATIVE

## 2024-03-28 LAB — HEPATIC FUNCTION PANEL
ALT: 33 U/L (ref 0–44)
AST: 39 U/L (ref 15–41)
Albumin: 3.2 g/dL — ABNORMAL LOW (ref 3.5–5.0)
Alkaline Phosphatase: 81 U/L (ref 38–126)
Bilirubin, Direct: 0.1 mg/dL (ref 0.0–0.2)
Indirect Bilirubin: 0.1 mg/dL — ABNORMAL LOW (ref 0.3–0.9)
Total Bilirubin: 0.2 mg/dL (ref 0.0–1.2)
Total Protein: 6.6 g/dL (ref 6.5–8.1)

## 2024-03-28 LAB — GLUCOSE, CAPILLARY
Glucose-Capillary: 163 mg/dL — ABNORMAL HIGH (ref 70–99)
Glucose-Capillary: 176 mg/dL — ABNORMAL HIGH (ref 70–99)
Glucose-Capillary: 190 mg/dL — ABNORMAL HIGH (ref 70–99)
Glucose-Capillary: 206 mg/dL — ABNORMAL HIGH (ref 70–99)
Glucose-Capillary: 214 mg/dL — ABNORMAL HIGH (ref 70–99)
Glucose-Capillary: 228 mg/dL — ABNORMAL HIGH (ref 70–99)

## 2024-03-28 MED ORDER — SODIUM CHLORIDE 0.9 % IV SOLN
2.0000 g | Freq: Three times a day (TID) | INTRAVENOUS | Status: DC
  Administered 2024-03-28 (×2): 2 g via INTRAVENOUS
  Filled 2024-03-28 (×3): qty 12.5

## 2024-03-28 MED ORDER — ACETAMINOPHEN 650 MG RE SUPP: 650.0000 mg | RECTAL | Status: DC | PRN

## 2024-03-28 MED ORDER — INSULIN ASPART 100 UNIT/ML IJ SOLN
0.0000 [IU] | INTRAMUSCULAR | Status: DC
  Administered 2024-03-28: 3 [IU] via SUBCUTANEOUS
  Administered 2024-03-28: 5 [IU] via SUBCUTANEOUS
  Administered 2024-03-28: 3 [IU] via SUBCUTANEOUS
  Administered 2024-03-28: 5 [IU] via SUBCUTANEOUS
  Administered 2024-03-29: 0.3 [IU] via SUBCUTANEOUS
  Administered 2024-03-29: 1 [IU] via SUBCUTANEOUS
  Administered 2024-03-29 – 2024-03-30 (×5): 3 [IU] via SUBCUTANEOUS
  Administered 2024-03-30 (×2): 2 [IU] via SUBCUTANEOUS
  Administered 2024-03-31 (×2): 5 [IU] via SUBCUTANEOUS
  Administered 2024-03-31 – 2024-04-02 (×16): 3 [IU] via SUBCUTANEOUS
  Administered 2024-04-03 (×4): 2 [IU] via SUBCUTANEOUS
  Administered 2024-04-03 – 2024-04-04 (×3): 3 [IU] via SUBCUTANEOUS
  Administered 2024-04-04: 2 [IU] via SUBCUTANEOUS
  Administered 2024-04-04 (×2): 3 [IU] via SUBCUTANEOUS
  Filled 2024-03-28: qty 5
  Filled 2024-03-28 (×3): qty 3
  Filled 2024-03-28: qty 2
  Filled 2024-03-28: qty 4
  Filled 2024-03-28: qty 2
  Filled 2024-03-28: qty 3
  Filled 2024-03-28: qty 2
  Filled 2024-03-28 (×3): qty 3
  Filled 2024-03-28: qty 2
  Filled 2024-03-28: qty 1
  Filled 2024-03-28: qty 2
  Filled 2024-03-28 (×3): qty 3
  Filled 2024-03-28: qty 2
  Filled 2024-03-28 (×6): qty 3
  Filled 2024-03-28: qty 2
  Filled 2024-03-28: qty 5
  Filled 2024-03-28 (×9): qty 3
  Filled 2024-03-28: qty 4
  Filled 2024-03-28: qty 3
  Filled 2024-03-28: qty 2
  Filled 2024-03-28: qty 5

## 2024-03-28 MED ORDER — SODIUM CHLORIDE 0.9 % IV SOLN
1.0000 g | Freq: Three times a day (TID) | INTRAVENOUS | Status: DC
  Administered 2024-03-28 – 2024-04-01 (×11): 1 g via INTRAVENOUS
  Filled 2024-03-28 (×12): qty 20

## 2024-03-28 MED ORDER — VANCOMYCIN HCL 1500 MG/300ML IV SOLN
1500.0000 mg | Freq: Every day | INTRAVENOUS | Status: DC
  Administered 2024-03-28 – 2024-03-30 (×3): 1500 mg via INTRAVENOUS
  Filled 2024-03-28 (×3): qty 300

## 2024-03-28 MED ORDER — IBUPROFEN 100 MG/5ML PO SUSP
400.0000 mg | Freq: Three times a day (TID) | ORAL | Status: DC | PRN
  Administered 2024-03-28 – 2024-04-01 (×3): 400 mg
  Filled 2024-03-28 (×4): qty 20

## 2024-03-28 MED ORDER — ACETAMINOPHEN 325 MG PO TABS
650.0000 mg | ORAL_TABLET | ORAL | Status: DC | PRN
  Administered 2024-03-31: 650 mg
  Filled 2024-03-28: qty 2

## 2024-03-28 MED ORDER — ACETAMINOPHEN 160 MG/5ML PO SOLN
650.0000 mg | ORAL | Status: DC | PRN
  Administered 2024-03-30 – 2024-04-03 (×6): 650 mg
  Filled 2024-03-28 (×7): qty 20.3

## 2024-03-28 NOTE — Progress Notes (Incomplete)
 PHARMACY - PHYSICIAN COMMUNICATION CRITICAL VALUE ALERT - BLOOD CULTURE IDENTIFICATION (BCID)  Marie PEDERSON is an 78 y.o. female who presented to [facility] on 03/17/2024 now with concerns for pneumonitis.  Assessment: MRSE (mecA/C) detected in 2 of 4 bottles from same set secondary to possible respiratory source.   Name of physician (or Provider) Contacted: ***  Current antibiotics: cefepime, Flagyl  Changes to prescribed antibiotics recommended:  {Plan:21268}  Results for orders placed or performed during the hospital encounter of 03/17/24  Blood Culture ID Panel (Reflexed) (Collected: 03/27/2024 12:10 PM)  Result Value Ref Range   Enterococcus faecalis NOT DETECTED NOT DETECTED   Enterococcus Faecium NOT DETECTED NOT DETECTED   Listeria monocytogenes NOT DETECTED NOT DETECTED   Staphylococcus species DETECTED (A) NOT DETECTED   Staphylococcus aureus (BCID) NOT DETECTED NOT DETECTED   Staphylococcus epidermidis DETECTED (A) NOT DETECTED   Staphylococcus lugdunensis NOT DETECTED NOT DETECTED   Streptococcus species NOT DETECTED NOT DETECTED   Streptococcus agalactiae NOT DETECTED NOT DETECTED   Streptococcus pneumoniae NOT DETECTED NOT DETECTED   Streptococcus pyogenes NOT DETECTED NOT DETECTED   A.calcoaceticus-baumannii NOT DETECTED NOT DETECTED   Bacteroides fragilis NOT DETECTED NOT DETECTED   Enterobacterales NOT DETECTED NOT DETECTED   Enterobacter cloacae complex NOT DETECTED NOT DETECTED   Escherichia coli NOT DETECTED NOT DETECTED   Klebsiella aerogenes NOT DETECTED NOT DETECTED   Klebsiella oxytoca NOT DETECTED NOT DETECTED   Klebsiella pneumoniae NOT DETECTED NOT DETECTED   Proteus species NOT DETECTED NOT DETECTED   Salmonella species NOT DETECTED NOT DETECTED   Serratia marcescens NOT DETECTED NOT DETECTED   Haemophilus influenzae NOT DETECTED NOT DETECTED   Neisseria meningitidis NOT DETECTED NOT DETECTED   Pseudomonas aeruginosa NOT DETECTED NOT DETECTED    Stenotrophomonas maltophilia NOT DETECTED NOT DETECTED   Candida albicans NOT DETECTED NOT DETECTED   Candida auris NOT DETECTED NOT DETECTED   Candida glabrata NOT DETECTED NOT DETECTED   Candida krusei NOT DETECTED NOT DETECTED   Candida parapsilosis NOT DETECTED NOT DETECTED   Candida tropicalis NOT DETECTED NOT DETECTED   Cryptococcus neoformans/gattii NOT DETECTED NOT DETECTED   Methicillin resistance mecA/C DETECTED (A) NOT DETECTED    Leonor BROCKS Emmanual Gauthreaux 03/28/2024  9:46 AM

## 2024-03-28 NOTE — Progress Notes (Signed)
 Date of Admission:  03/17/2024      ID: Marie Pham is a 78 y.o. female  Principal Problem:   Acute CVA (cerebrovascular accident) [facility]) Active Problems:   Essential hypertension   AKI (acute kidney injury)   Hypernatremia   Abnormal EKG   SIRS (systemic inflammatory response syndrome) (HCC)   Severe aortic stenosis   Pneumonitis   Fever    Subjective: Pt is obtunded    Medications:   vitamin C  500 mg Per Tube BID   aspirin   81 mg Per NG tube Daily   atorvastatin   80 mg Per NG tube Daily   carvedilol   12.5 mg Per NG tube BID WC   cyanocobalamin   1,000 mcg Intramuscular Q0600   feeding supplement (PROSource TF20)  60 mL Per Tube Daily   fiber supplement (BANATROL TF)  60 mL Per Tube BID   free water   200 mL Per Tube Q4H   insulin aspart  0-15 Units Subcutaneous Q4H   latanoprost  1 drop Both Eyes QHS   losartan   25 mg Per Tube Daily   multivitamin with minerals  1 tablet Per Tube Daily   mouth rinse  15 mL Mouth Rinse 4 times per day   zinc sulfate (50mg  elemental zinc)  220 mg Per Tube Daily    Objective: Vital signs in last 24 hours: Patient Vitals for the past 24 hrs:  BP Temp Temp src Pulse Resp SpO2  03/28/24 1209 135/64 98.1 F (36.7 C) Axillary 96 (!) 33 95 %  03/28/24 1027 -- 98.9 F (37.2 C) Oral -- -- --  03/28/24 0924 -- -- -- -- -- 96 %  03/28/24 0919 -- -- -- -- -- 92 %  03/28/24 0810 (!) 163/67 -- -- (!) 108 -- --  03/28/24 0745 (!) 163/67 100.1 F (37.8 C) Axillary (!) 108 (!) 36 94 %  03/28/24 0707 -- (!) 100.5 F (38.1 C) Axillary -- -- --  03/28/24 0449 -- -- -- -- (!) 24 --  03/28/24 0411 (!) 169/67 99.6 F (37.6 C) Axillary -- -- --  03/27/24 2358 (!) 160/65 99.7 F (37.6 C) Axillary -- -- --  03/27/24 2032 (!) 161/68 99.2 F (37.3 C) Axillary -- -- --  03/27/24 1600 (!) 156/76 98.8 F (37.1 C) Axillary 98 -- 97 %     PHYSICAL EXAM:  General:obtunded- unresponsive.  Eyes: Conjunctivae clear, anicteric sclerae. Pupils are  equal Lungs:b/l air entry Heart: Regular rate and rhythm, no murmur, rub or gallop.systolic murmur Abdomen: Soft, obese . Bowel sounds normal. No masses Extremities: atrauamatic Sacrum - skin shearing  large islands  Skin: No rashes or lesions. Or bruising Lymph: Cervical, supraclavicular normal. Neurologic: cannot assess  Lab Results    Latest Ref Rng & Units 03/28/2024    5:13 AM 03/27/2024   12:10 PM 03/22/2024    4:26 AM  CBC  WBC 4.0 - 10.5 K/uL 15.0  13.2  9.9   Hemoglobin 12.0 - 15.0 g/dL 87.7  88.0  87.4   Hematocrit 36.0 - 46.0 % 38.5  37.6  39.5   Platelets 150 - 400 K/uL 265  254  238        Latest Ref Rng & Units 03/28/2024    5:13 AM 03/27/2024   12:10 PM 03/26/2024    5:19 AM  CMP  Glucose 70 - 99 mg/dL 772  768  798   BUN 8 - 23 mg/dL 21  26  28    Creatinine  0.44 - 1.00 mg/dL 9.31  9.26  9.26   Sodium 135 - 145 mmol/L 145  146  148   Potassium 3.5 - 5.1 mmol/L 4.8  4.5  4.4   Chloride 98 - 111 mmol/L 107  108  109   CO2 22 - 32 mmol/L 29  31  32   Calcium  8.9 - 10.3 mg/dL 8.9  8.7  8.9       Microbiology: 12/1 [facility]- pending  Studies/Results: DG Abd 1 View Result Date: 03/27/2024 EXAM: 1 VIEW XRAY OF THE ABDOMEN 03/27/2024 01:50:00 PM COMPARISON: 03/23/2024, CT 03/17/2024. CLINICAL HISTORY: Fever. FINDINGS: BOWEL: Nonobstructive bowel gas pattern. SOFT TISSUES: Large caliber catheter projects over the lower pelvis inferior to the symphysis pubis joint. Atherosclerotic calcifications present. There is a well defined 6 cm oval density projecting over the right side of the pelvis with associated more dense rectangular structures centrally superimposed on this density of uncertain clinical significance. No opaque urinary calculi. 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 Visualized left hip arthroplasty unchanged. No acute osseous abnormality. IMPRESSION: 1. No acute abnormality identified. 2. Large-bore pelvic catheter projects over the lower pelvis of  uncertain clinical significance . Radiodensity over the right pelvis likely foreign body and of uncertain clinical significance. Electronically signed by: Toribio Agreste MD 03/27/2024 05:49 PM EST RP Workstation: [id number]   DG Chest Port 1 View Result Date: 03/27/2024 EXAM: 1 VIEW(S) XRAY OF THE CHEST 03/27/2024 01:50:00 PM COMPARISON: 03/21/2024 CLINICAL HISTORY: Fever FINDINGS: LINES, TUBES AND DEVICES: Enteric tube in place with tip in proximal stomach. LUNGS AND PLEURA: Lungs are hypoinflated. Linear density of the left base/retrocardiac region compatible with atelectasis. Possible small left pleural effusion. No pneumothorax. No other focal pulmonary opacity. HEART AND MEDIASTINUM: Atherosclerotic calcifications of aortic arch. No acute abnormality of the cardiac and mediastinal silhouettes. BONES AND SOFT TISSUES: No acute osseous abnormality. IMPRESSION: 1. Hypoinflation with linear density at the left base/retrocardiac region compatible with atelectasis. 2. Enter tube unchanged. Electronically signed by: Toribio Agreste MD 03/27/2024 05:42 PM EST RP Workstation: [id number]   DG Naso G Tube Plc W/Fl W/Rad Result Date: 03/27/2024 INDICATION: Protein calorie malnutrition s/p Dobbhoff placement on 03/20/2024 that was unintentionally removed earlier today. EXAM: NASO G TUBE PLACEMENT WITH FL AND WITH RAD FLUOROSCOPY TIME:  Radiation Exposure Index (as provided by the fluoroscopic device): 8.3 mGy Kerma COMPLICATIONS: None immediate PROCEDURE: The Dobbhoff tube was lubricated with viscous lidocaine inserted into the right nostril. Under intermittent fluoroscopic guidance, the Dobbhoff tube was advanced through the stomach, through the duodenum with tip ultimately terminating in the descending duodenum. A spot fluoroscopic image was saved for documentation purposes. The tube was affixed to the patient's nose with tape. The patient tolerated the procedure well without immediate postprocedural complication.  IMPRESSION: Successful fluoroscopic guided placement of Dobbhoff tube with tip terminating in the descending duodenum. The tube is ready for immediate use. This exam was performed by [facility] PA-C, and was supervised and interpreted by Dr. VEAR Lent. Electronically Signed   By: Wilkie Lent M.D.   On: 03/27/2024 16:27    Progressed b/l ACA territory infarcts with cytotoxic edema left > rt   Assessment/Plan: 78 yr female in the hospital since 11/21 Extensive CVA   CVA- b/l ACA involvement unresponsive Multiple  infarcts in other areas Cytotoxic edema DC metronidazole Cefepime was started today by Hospitalist for broader cover for fever as she is allergic to PCN DC cefepime as it can worsen encephalopathy  Fever- could be from aspiration pneumonitis' Central fever possible R/o medication induced fever cdiff ruled out-  No vegetations on the valves on 2 d echo 11/22  buttock wound-clean not infected   Check UA/microscopy PVR to look for any residue  Blood culture, CXR done Pt is currently on cefepime and metronidazole  - discontinue both Start meropenem Staph epidermidis in 1 set of blood culture- repeat sent very likely contaminant- started vanco- DC if other set culture remains negative tomorrow   Adjust antibiotics as things evolve   Hypernatremia- resolved Pt on NG feeds   Hyperglycemia     Aortic stenosis H/o back pain- may need imaging if fever persist   Dsicussed the management with family and her nurse and hospitalist  Dr.Fitzgerald will take over her care from tomorrow

## 2024-03-28 NOTE — Progress Notes (Signed)
 Occupational Therapy Re-Evaluation Patient Details Name: Marie Pham MRN: [id number] DOB: [birth year] Today's Date: 03/28/2024   History of present illness Pt is a 78 y/o female presenting with aphasia and R sided weakness with last known well over 24 hours prior. 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 Chest Xray showed atelectasis in L and R lungs. PMH: HTN, arthritis, chronic back pain, aortic stenosis.   OT comments  Ms Conrow was seen for OT re-evaluation on this date. Upon arrival to room pt in bed with family at bedside. Pt requires TOTAL A face washing at bed level. TOTAL A repositioning in bed. Pt making limited progress, goals updated to reflect low arousal limiting participation. Discharge recommendation updated to reflect increased needs.   Patient assessed with multi-modal sensory stimulation techniques, as indicated below, to optimize alertness and arousal during skilled therapy session.   System Stimulus Response  Auditory Calling name  No response  Tactile Light touch  No response    Textured touch  No response   Noxious stimuli  No response but blink to threat intact  Vestibular Elevating HOB  No response   Rolling in bed  Eyes opening minimally  Proprioceptive Passive ROM  No response to BUE/BLE, minimal eye opening to neck PROM   Joint approximation of UE  No response   Joint approximation of LE  No response          If plan is discharge home, recommend the following:  Two people to help with walking and/or transfers;Two people to help with bathing/dressing/bathroom;Direct supervision/assist for medications management;Assistance with cooking/housework;Direct supervision/assist for financial management;Assist for transportation;Help with stairs or ramp for entrance;Supervision due to cognitive status   Equipment Recommendations  Hospital bed;Hoyer lift    Recommendations for Other Services       Precautions / Restrictions Precautions Precautions: Fall Recall of Precautions/Restrictions: Impaired Precaution/Restrictions Comments: NGT Restrictions Weight Bearing Restrictions Per Provider Order: No       Mobility Bed Mobility Overal bed mobility: Needs Assistance Bed Mobility: Rolling Rolling: Total assist              Transfers                   General transfer comment: unsafe to attempt 2/2 lethargy         ADL either performed or assessed with clinical judgement   ADL Overall ADL's : Needs assistance/impaired                                       General ADL Comments: TOTAL A face washing at bed level. TOTAL A repositioning in bed    Extremity/Trunk Assessment Upper Extremity Assessment RUE Deficits / Details: PROM WFL, no active movement LUE Deficits / Details: PROM WFL, no active movement                     Communication Communication Communication: Impaired Factors Affecting Communication:  (nonverbal this session)   Cognition Arousal: Stuporous Behavior During Therapy: Flat affect Cognition: Difficult to assess Difficult to assess due to: Level of arousal           OT - Cognition Comments: lethargic, flat affect, eyes closed for session, no command following. does not withdraw to pain or cold washcloth.  Following commands: Impaired                      Pertinent Vitals/ Pain       Pain Assessment Pain Assessment: PAINAD Breathing: normal Negative Vocalization: none Facial Expression: smiling or inexpressive Body Language: relaxed Consolability: no need to console PAINAD Score: 0   Frequency  Min 1X/week        Progress Toward Goals  OT Goals(current goals can now be found in the care plan section)  Progress towards OT goals: Goals drowngraded-see care plan  Acute Rehab OT Goals Patient Stated Goal: family goal for pt to increase alertness OT Goal  Formulation: With family Time For Goal Achievement: 04/11/24 Potential to Achieve Goals: Poor ADL Goals Pt Will Perform Grooming: with mod assist;bed level Pt Will Perform Upper Body Dressing: with max assist;sitting Pt Will Transfer to Toilet: with max assist (rolling bed level)  Plan      Co-evaluation                 AM-PAC OT 6 Clicks Daily Activity     Outcome Measure   Help from another person eating meals?: Total Help from another person taking care of personal grooming?: Total Help from another person toileting, which includes using toliet, bedpan, or urinal?: Total Help from another person bathing (including washing, rinsing, drying)?: Total Help from another person to put on and taking off regular upper body clothing?: Total Help from another person to put on and taking off regular lower body clothing?: Total 6 Click Score: 6    End of Session    OT Visit Diagnosis: Other symptoms and signs involving the nervous system (R29.898);Other symptoms and signs involving cognitive function;Muscle weakness (generalized) (M62.81);Repeated falls (R29.6);Hemiplegia and hemiparesis Hemiplegia - Right/Left: Right Hemiplegia - dominant/non-dominant: Dominant Hemiplegia - caused by: Cerebral infarction   Activity Tolerance Patient limited by lethargy   Patient Left in bed;with call bell/phone within reach;with family/visitor present   Nurse Communication          Time: 9071-9061 OT Time Calculation (min): 10 min  Charges: OT General Charges $OT Visit: 1 Visit OT Evaluation $OT Re-eval: 1 Re-eval  Elston Slot, M.S. OTR/L  03/28/24, 9:46 AM  ascom [phone]

## 2024-03-28 NOTE — TOC Progression Note (Addendum)
 Transition of Care [facility]) - Progression Note    Patient Details  Name: CORRISSA MARTELLO MRN: [id number] Date of Birth: 02-19-1946  Transition of [facility] CM/SW Contact  Lauraine JAYSON Carpen, LCSW Phone Number: 03/28/2024, 11:08 AM  Clinical Narrative:  Left voicemail for [facility] admissions coordinator.   12:49 pm: Received call from [place] at [facility]. Discussed rehab vs LTC. Patient currently unable to participate with therapy. Family has started [facility] [place] [facility] application. CSW met with daughter at bedside. She has spoken with Clotilda about the price deferential for a private vs semiprivate room. Discussed concern for ability to participate with therapy. Daughter said her brother told her that patient was more interactive last night. CSW asked about having her days and nights switched and she said she has done that at home for years.  1:38 pm: Per artist, Medicaid application was submitted this morning at around 9:00 am. Left message on Shannon's secure voicemail to notify.  Expected Discharge Plan: Skilled Nursing Facility Barriers to Discharge: Continued Medical Work up               Expected Discharge Plan and Services     Post Acute Care Choice: Skilled Nursing Facility Living arrangements for the past 2 months: Single Family Home                                       Social Drivers of Health (SDOH) Interventions SDOH Screenings   Food Insecurity: Patient Unable To Answer (03/18/2024)  Housing: Patient Unable To Answer (03/18/2024)  Transportation Needs: Patient Unable To Answer (03/18/2024)  Utilities: Patient Unable To Answer (03/18/2024)  Financial Resource Strain: High Risk (09/01/2023)   Received from [facility]  Social Connections: Patient Unable To Answer (03/18/2024)  Tobacco Use: Medium Risk (03/17/2024)    Readmission Risk Interventions     No data to display

## 2024-03-28 NOTE — Progress Notes (Signed)
 Progress Note    Marie Pham  FMW:[id number] DOB: January 15, 1946  DOA: 03/17/2024 PCP: Tobie Domino, MD      Brief Narrative:    Medical records reviewed and are as summarized below:  Marie Pham is a 78 y.o. female with medical history significant for hypertension, chronic back pain, arthritis, sleep apnea, depression, asthma/bronchitis, ambulates with a walker at baseline who presented to the hospital because of poor oral intake and speech difficulties.  Family had tried to communicate with her and she appeared to understand what her son was saying but she could not respond.  Onset or duration of speech difficulty is not clear.  In the ED, apparently patient indicated that her back hurts.  Vitals in the ED: Temperature 98.7 F, respiratory rate 18, pulse 115, BP 120/99 oxygen saturation 95% on room air.  Troponins negative, mildly elevated D-dimer 0.92. EKG shows sinus tachycardia, right bundle branch block, prolonged QTc at 494  CT angiogram of head and neck showed acute left basal ganglia infarct.  Chest x-ray 1. Linear atelectasis in the left base and right mid lung, which may contribute to shortness of breath. 2. No acute cardiopulmonary process identified.  She was admitted to the hospital for acute stroke.  11/26: Patient very somnolent and difficult to arouse, EEG was also ordered.  Family would like to continue full scope of care, might need a PEG tube placement.  Currently being fed with corTrack.  11/27: No change in mentation, remained minimally responsive to obnoxious stimuli.  Repeat ABG with mild hypercarbia at 49 which does not explain this encephalopathy, ammonia mildly elevated at 37, we will give her a trial of bicarb, also adding lactulose .  Repeating brain MRI and neurology was reconsulted.  1 episode of A-fib was mentioned on her monitor on 11/23, talked with central monitoring system and it was PACs, she never had any documented atrial  fibrillation.  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  IR was consulted for PEG tube placement and Plavix  need to be held for 5 days, most likely will get her PEG on 03/29/2024.  TOC to work on placement.  Daughter works in a facility in [place] and trying to get her there. Family would like to continue current level of care to see if there is any improvement in her mental status over the next few months before thinking about transitioning to hospice.  Neurology is not very hopeful.  Only time will tell.  Repeat MRI yesterday with progressed bilateral ACA territory infarct with cytotoxic edema, left greater than right, no hemorrhagic transformation or mass effect.  Also noted evolution of left caudate infarct with possible petechial hemorrhage but no malignant hemorrhagic transformation or mass effect.  No new vascular territory ischemia.  Developed diarrhea so holding MiraLAX  and lactulose .  11/29: Remained unresponsive, family concern of developing pressure injuries and nurses are not changing position every 2 hourly as recommended-discussed with nursing staff.  Air mattress was also ordered.  Persistent hypernatremia at 150, increasing the frequency of free water  to Q4 hourly.  Patient developed low-grade fever overnight, had an aspiration episode on 11/27-starting on ceftriaxone  and Flagyl.  11/30: Hemodynamically stable, improving hyponatremia now at 148.  Patient started breathing a little heavy when did the sternal rub, apparently she did open her eyes and look at daughter earlier.  12/1: Patient became febrile again with temperature of 101.  Blood cultures ordered.  Repeat chest x-ray and abdominal x-ray ordered.  Having some diarrhea but it was not watery. Feeding tube fell off-ordered another Dobbhoff placement. ID was also consulted for recurrent fever.  12/2: Patient remained febrile,C. difficile negative,1/4 blood cultures with Staph  epidermidis which is likely a contaminant but repeat cultures ordered.  Antibiotics were broadened with cefepime from ceftriaxone , vancomycin was also added per ID.  Patient with very guarded prognosis.    Assessment/Plan:   Principal Problem:   Acute CVA (cerebrovascular accident) (HCC) Active Problems:   AKI (acute kidney injury)   Hypernatremia   SIRS (systemic inflammatory response syndrome) (HCC)   Abnormal EKG   Essential hypertension   Severe aortic stenosis   Pneumonitis   Fever   Body mass index is 31.48 kg/m.  (Class I obesity)  Acute stroke with lethargy, right facial droop, right hemiparesis and aphasia: Persistent encephalopathy Patient with bilateral frontal lobe and basal ganglia infarct. 2D echo showed EF estimated at 60 to 65%, grade 3 diastolic dysfunction, severe aortic valve stenosis. Continue low-dose aspirin  and Lipitor  via NG tube Holding Plavix  for 5 days, need PEG tube placed on 03/29/2024 PT and OT recommended discharge to SNF. Patient remained unresponsive S/p NG tube placement by IR on 03/20/2024 Continue enteral nutrition via NG tube- EEG with generalized cortical dysfunction, no epileptiform discharges. - Repeat brain MRI with progress bilateral ACA territory infarct, cytogenic edema with no mass effect or hemorrhagic transformation, please see the full report. - palliative is involved  SIRS, tachycardia, tachypnea, leukocytosis, pneumonitis:  Leukocytosis resolved Repeat chest x-ray on 03/21/2024 because rapid response was called in the early hours of the morning for tachypnea.  Chest x-ray showed low lung volumes with bronchovascular crowding but there was no focal consolidation.  CTA chest on11/22/2025 did not show any evidence of pulmonary embolism but there is evidence of bilateral pneumonitis.  This is probably due to aspiration.   - Completed a 5-day course of antibiotics with ceftriaxone  and Zithromax   11/29: Became febrile again, had  another episode of aspiration on 11/27-starting on ceftriaxone  and Flagyl.  12/1: Another episode of fever above 101.  Repeat chest x-ray, abdominal x-ray, CBC with differential and blood cultures ordered.  ID was consulted - Continuing ceftriaxone  and Flagyl for now  12/2: Remained febrile, ID is on board.1/4 blood culture with Staph epidermidis-likely contaminant but ID decided to add vancomycin.  Ceftriaxone  was broadened with cefepime  .  Blood cultures ordered - She is high risk for central fever due to very significant brain infarct  Severe aortic stenosis, grade 3 diastolic dysfunction: Will need outpatient follow-up with cardiologist.  Hypernatremia, dehydration: Improved.  Sodium down from 153-146.>147.145>>150>.150>>148>.146>145 - Continue with current frequency of free water  -Continue to monitor sodium AKI ruled out.  Hypertension: Blood pressure within goal  Continue carvedilol .  Chronic back pain/lumbar radiculitis - Pain management - Currently patient is unresponsive  Hyperglycemia.  CBG elevated with the start of tube feed, with most recent A1c of 6.1 on 11/22. SSI added.  Pressure injuries.  Mattress was switched to air mattress - Change position every 2 hour - Wound care   Plan of care was discussed with [name] (son), they want to keep her full scope of care to see the trajectory. They think that care was delayed in the ED and patient could have been a candidate for tPA.  They said they have read a lot about strokes. I explained that it was documented in the chart that patient was last known normal over 24 hours, and that given tPA beyond  the recommended timeframe could potentially have serious complications but with little benefit.   Diet Order             Diet NPO time specified  Diet effective midnight           Diet NPO time specified  Diet effective now                  Consultants: Neurologist  Procedures: NG tube placement by IR on  03/20/2024  Medications:    vitamin C  500 mg Per Tube BID   aspirin   81 mg Per NG tube Daily   atorvastatin   80 mg Per NG tube Daily   carvedilol   12.5 mg Per NG tube BID WC   cyanocobalamin   1,000 mcg Intramuscular Q0600   feeding supplement (PROSource TF20)  60 mL Per Tube Daily   fiber supplement (BANATROL TF)  60 mL Per Tube BID   free water   200 mL Per Tube Q4H   insulin aspart  0-15 Units Subcutaneous Q4H   latanoprost  1 drop Both Eyes QHS   losartan   25 mg Per Tube Daily   multivitamin with minerals  1 tablet Per Tube Daily   mouth rinse  15 mL Mouth Rinse 4 times per day   zinc sulfate (50mg  elemental zinc)  220 mg Per Tube Daily   Continuous Infusions:  ceFEPime (MAXIPIME) IV Stopped (03/28/24 1158)   feeding supplement (OSMOLITE 1.5 CAL) 50 mL/hr at 03/27/24 0402   metronidazole Stopped (03/28/24 1400)   vancomycin Stopped (03/28/24 1539)     Anti-infectives (From admission, onward)    Start     Dose/Rate Route Frequency Ordered Stop   03/28/24 1300  vancomycin (VANCOREADY) IVPB 1500 mg/300 mL        1,500 mg 150 mL/hr over 120 Minutes Intravenous Daily 03/28/24 1141     03/28/24 1000  ceFEPIme (MAXIPIME) 2 g in sodium chloride  0.9 % 100 mL IVPB        2 g 200 mL/hr over 30 Minutes Intravenous Every 8 hours 03/28/24 0835     03/25/24 1330  metroNIDAZOLE (FLAGYL) IVPB 500 mg        500 mg 100 mL/hr over 60 Minutes Intravenous Every 12 hours 03/25/24 1214     03/25/24 1300  cefTRIAXone  (ROCEPHIN ) 1 g in sodium chloride  0.9 % 100 mL IVPB  Status:  Discontinued        1 g 200 mL/hr over 30 Minutes Intravenous Every 24 hours 03/25/24 1214 03/28/24 0835   03/19/24 1630  azithromycin  (ZITHROMAX ) 500 mg in sodium chloride  0.9 % 250 mL IVPB  Status:  Discontinued        500 mg 250 mL/hr over 60 Minutes Intravenous Every 24 hours 03/19/24 1533 03/22/24 1542   03/18/24 1045  cefTRIAXone  (ROCEPHIN ) 1 g in sodium chloride  0.9 % 100 mL IVPB  Status:  Discontinued        1  g 200 mL/hr over 30 Minutes Intravenous Every 24 hours 03/18/24 0945 03/22/24 1542       Family Communication/Anticipated D/C date and plan/Code Status   DVT prophylaxis: Place and maintain sequential compression device Start: 03/28/24 1531 enoxaparin  (LOVENOX ) injection 40 mg Start: 03/18/24 1038     Code Status: Do not attempt resuscitation (DNR) PRE-ARREST INTERVENTIONS DESIRED  Family Communication: Discussed with son at bedside  Disposition Plan: Plan to discharge to SNF   Status is: Inpatient Remains inpatient appropriate because: Acute stroke and persistent encephalopathy  Subjective:  Patient remained unresponsive, little tachypneic today.  Diarrhea seems improving.  Objective:    Vitals:   03/28/24 0919 03/28/24 0924 03/28/24 1027 03/28/24 1209  BP:    135/64  Pulse:    96  Resp:    (!) 33  Temp:   98.9 F (37.2 C) 98.1 F (36.7 C)  TempSrc:   Oral Axillary  SpO2: 92% 96%  95%  Weight:      Height:       No data found.   Intake/Output Summary (Last 24 hours) at 03/28/2024 1551 Last data filed at 03/28/2024 1252 Gross per 24 hour  Intake 1320 ml  Output 1100 ml  Net 220 ml   Filed Weights   03/24/24 0449 03/25/24 0345 03/26/24 0456  Weight: 87.1 kg 85.1 kg 85.8 kg    Exam: General.  Obese and frail lady, in no acute distress. Pulmonary.  Lungs clear bilaterally, mildly increased work of breathing CV.  Regular rate and rhythm, no JVD, rub or murmur. Abdomen.  Soft, nontender, nondistended, BS positive. CNS.  Unresponsive,  Extremities.  No edema,  pulses intact and symmetrical.    Data Reviewed:   I have personally reviewed following labs and imaging studies:  Labs: Labs show the following:   Basic Metabolic Panel: Recent Labs  Lab 03/22/24 0426 03/23/24 0940 03/24/24 0457 03/25/24 0535 03/26/24 0519 03/27/24 1210 03/28/24 0513  NA 147*   < > 150* 150* 148* 146* 145  K 4.5   < > 4.7 4.7 4.4 4.5 4.8  CL 111   < > 111 110 109  108 107  CO2 26   < > 32 34* 32 31 29  GLUCOSE 150*   < > 112* 161* 201* 231* 227*  BUN 17   < > 17 25* 28* 26* 21  CREATININE 0.65   < > 0.67 0.81 0.73 0.73 0.68  CALCIUM  9.0   < > 9.3 9.2 8.9 8.7* 8.9  MG 2.5*  --   --   --   --   --   --   PHOS 3.6   < > 4.1 4.1 3.2 4.0 3.8   < > = values in this interval not displayed.   GFR Estimated Creatinine Clearance: 65.7 mL/min (by C-G formula based on SCr of 0.68 mg/dL). Liver Function Tests: Recent Labs  Lab 03/24/24 0457 03/25/24 0535 03/26/24 0519 03/27/24 1210 03/28/24 0513  AST  --   --   --   --  39  ALT  --   --   --   --  33  ALKPHOS  --   --   --   --  81  BILITOT  --   --   --   --  0.2  PROT  --   --   --   --  6.6  ALBUMIN 3.2* 3.2* 3.1* 3.1* 3.2*  3.2*   No results for input(s): LIPASE, AMYLASE in the last 168 hours.  Recent Labs  Lab 03/23/24 1028  AMMONIA 37*   Coagulation profile No results for input(s): INR, PROTIME in the last 168 hours.  CBC: Recent Labs  Lab 03/22/24 0426 03/27/24 1210 03/28/24 0513  WBC 9.9 13.2* 15.0*  NEUTROABS  --  8.7* 10.6*  HGB 12.5 11.9* 12.2  HCT 39.5 37.6 38.5  MCV 89.6 89.7 90.6  PLT 238 254 265   Cardiac Enzymes: No results for input(s): CKTOTAL, CKMB, CKMBINDEX, TROPONINI in the last 168 hours. BNP (last  3 results) No results for input(s): PROBNP in the last 8760 hours. CBG: Recent Labs  Lab 03/27/24 2036 03/28/24 0000 03/28/24 0415 03/28/24 0747 03/28/24 1209  GLUCAP 220* 228* 190* 214* 206*   D-Dimer: No results for input(s): DDIMER in the last 72 hours.  Hgb A1c: No results for input(s): HGBA1C in the last 72 hours.  Lipid Profile: No results for input(s): CHOL, HDL, LDLCALC, TRIG, CHOLHDL, LDLDIRECT in the last 72 hours.  Thyroid  function studies: No results for input(s): TSH, T4TOTAL, T3FREE, THYROIDAB in the last 72 hours.  Invalid input(s): FREET3  Anemia work up: No results for input(s):  VITAMINB12, FOLATE, FERRITIN, TIBC, IRON, RETICCTPCT in the last 72 hours.  Sepsis Labs: Recent Labs  Lab 03/22/24 0426 03/27/24 1210 03/28/24 0513  WBC 9.9 13.2* 15.0*    Microbiology Recent Results (from the past 240 hours)  Culture, blood (Routine X 2) w Reflex to ID Panel     Status: None (Preliminary result)   Collection Time: 03/27/24 12:10 PM   Specimen: BLOOD  Result Value Ref Range Status   Specimen Description BLOOD BLOOD RIGHT ARM  Final   Special Requests   Final    BOTTLES DRAWN AEROBIC AND ANAEROBIC Blood Culture adequate volume   Culture   Final    NO GROWTH < 24 HOURS Performed at [facility], [address], [place], [place] [address]    Report Status PENDING  Incomplete  Culture, blood (Routine X 2) w Reflex to ID Panel     Status: None (Preliminary result)   Collection Time: 03/27/24 12:10 PM   Specimen: BLOOD LEFT ARM  Result Value Ref Range Status   Specimen Description   Final    BLOOD LEFT ARM Performed at [facility], [address], [place], [place] [address]    Special Requests   Final    BOTTLES DRAWN AEROBIC AND ANAEROBIC Blood Culture adequate volume Performed at [facility], [address] [place], [place] [address]    Culture  Setup Time   Final    GRAM POSITIVE COCCI IN BOTH AEROBIC AND ANAEROBIC BOTTLES CRITICAL RESULT CALLED TO, READ BACK BY AND VERIFIED WITH: MOSE BLEW 03/28/24 9071 MW Performed at [facility], [address] [place], [place] [address]    Culture GRAM POSITIVE COCCI  Final   Report Status PENDING  Incomplete  Blood Culture ID Panel (Reflexed)     Status: Abnormal   Collection Time: 03/27/24 12:10 PM  Result Value Ref Range Status   Enterococcus faecalis NOT DETECTED NOT DETECTED Final   Enterococcus Faecium NOT DETECTED NOT DETECTED Final   Listeria monocytogenes NOT DETECTED NOT DETECTED Final   Staphylococcus species DETECTED (A) NOT DETECTED Final     Comment: CRITICAL RESULT CALLED TO, READ BACK BY AND VERIFIED WITH: TREY GREENWOOD 03/28/24 0928 MW    Staphylococcus aureus (BCID) NOT DETECTED NOT DETECTED Final   Staphylococcus epidermidis DETECTED (A) NOT DETECTED Final    Comment: Methicillin (oxacillin) resistant coagulase negative staphylococcus. Possible blood culture contaminant (unless isolated from more than one blood culture draw or clinical case suggests pathogenicity). No antibiotic treatment is indicated for blood  culture contaminants. CRITICAL RESULT CALLED TO, READ BACK BY AND VERIFIED WITH: TREY GREENWOOD 03/28/24 0928 MW    Staphylococcus lugdunensis NOT DETECTED NOT DETECTED Final   Streptococcus species NOT DETECTED NOT DETECTED Final   Streptococcus agalactiae NOT DETECTED NOT DETECTED Final   Streptococcus pneumoniae NOT DETECTED NOT DETECTED Final  Streptococcus pyogenes NOT DETECTED NOT DETECTED Final   A.calcoaceticus-baumannii NOT DETECTED NOT DETECTED Final   Bacteroides fragilis NOT DETECTED NOT DETECTED Final   Enterobacterales NOT DETECTED NOT DETECTED Final   Enterobacter cloacae complex NOT DETECTED NOT DETECTED Final   Escherichia coli NOT DETECTED NOT DETECTED Final   Klebsiella aerogenes NOT DETECTED NOT DETECTED Final   Klebsiella oxytoca NOT DETECTED NOT DETECTED Final   Klebsiella pneumoniae NOT DETECTED NOT DETECTED Final   Proteus species NOT DETECTED NOT DETECTED Final   Salmonella species NOT DETECTED NOT DETECTED Final   Serratia marcescens NOT DETECTED NOT DETECTED Final   Haemophilus influenzae NOT DETECTED NOT DETECTED Final   Neisseria meningitidis NOT DETECTED NOT DETECTED Final   Pseudomonas aeruginosa NOT DETECTED NOT DETECTED Final   Stenotrophomonas maltophilia NOT DETECTED NOT DETECTED Final   Candida albicans NOT DETECTED NOT DETECTED Final   Candida auris NOT DETECTED NOT DETECTED Final   Candida glabrata NOT DETECTED NOT DETECTED Final   Candida krusei NOT DETECTED NOT  DETECTED Final   Candida parapsilosis NOT DETECTED NOT DETECTED Final   Candida tropicalis NOT DETECTED NOT DETECTED Final   Cryptococcus neoformans/gattii NOT DETECTED NOT DETECTED Final   Methicillin resistance mecA/C DETECTED (A) NOT DETECTED Final    Comment: CRITICAL RESULT CALLED TO, READ BACK BY AND VERIFIED WITH: MOSE BLEW 03/28/24 9071 MW Performed at [facility], [address] [place], [place] [address]   C Difficile Quick Screen (NO PCR Reflex)     Status: None   Collection Time: 03/28/24  9:15 AM   Specimen: STOOL  Result Value Ref Range Status   C Diff antigen NEGATIVE NEGATIVE Final   C Diff toxin NEGATIVE NEGATIVE Final   C Diff interpretation No C. difficile detected.  Final    Comment: Performed at [facility], [address] [place], [place] [address]    Procedures and diagnostic studies:  DG Abd 1 View Result Date: 03/27/2024 EXAM: 1 VIEW XRAY OF THE ABDOMEN 03/27/2024 01:50:00 PM COMPARISON: 03/23/2024, CT 03/17/2024. CLINICAL HISTORY: Fever. FINDINGS: BOWEL: Nonobstructive bowel gas pattern. SOFT TISSUES: Large caliber catheter projects over the lower pelvis inferior to the symphysis pubis joint. Atherosclerotic calcifications present. There is a well defined 6 cm oval density projecting over the right side of the pelvis with associated more dense rectangular structures centrally superimposed on this density of uncertain clinical significance. No opaque urinary calculi. 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 Visualized left hip arthroplasty unchanged. No acute osseous abnormality. IMPRESSION: 1. No acute abnormality identified. 2. Large-bore pelvic catheter projects over the lower pelvis of uncertain clinical significance . Radiodensity over the right pelvis likely foreign body and of uncertain clinical significance. Electronically signed by: Toribio Agreste MD 03/27/2024 05:49 PM EST RP Workstation: [id number]   DG Chest Port 1  View Result Date: 03/27/2024 EXAM: 1 VIEW(S) XRAY OF THE CHEST 03/27/2024 01:50:00 PM COMPARISON: 03/21/2024 CLINICAL HISTORY: Fever FINDINGS: LINES, TUBES AND DEVICES: Enteric tube in place with tip in proximal stomach. LUNGS AND PLEURA: Lungs are hypoinflated. Linear density of the left base/retrocardiac region compatible with atelectasis. Possible small left pleural effusion. No pneumothorax. No other focal pulmonary opacity. HEART AND MEDIASTINUM: Atherosclerotic calcifications of aortic arch. No acute abnormality of the cardiac and mediastinal silhouettes. BONES AND SOFT TISSUES: No acute osseous abnormality. IMPRESSION: 1. Hypoinflation with linear density at the left base/retrocardiac region compatible with atelectasis. 2. Enter tube unchanged. Electronically signed by: Toribio Agreste MD 03/27/2024 05:42 PM EST RP  Workstation: [id number]   DG Naso G Tube Plc W/Fl W/Rad Result Date: 03/27/2024 INDICATION: Protein calorie malnutrition s/p Dobbhoff placement on 03/20/2024 that was unintentionally removed earlier today. EXAM: NASO G TUBE PLACEMENT WITH FL AND WITH RAD FLUOROSCOPY TIME:  Radiation Exposure Index (as provided by the fluoroscopic device): 8.3 mGy Kerma COMPLICATIONS: None immediate PROCEDURE: The Dobbhoff tube was lubricated with viscous lidocaine inserted into the right nostril. Under intermittent fluoroscopic guidance, the Dobbhoff tube was advanced through the stomach, through the duodenum with tip ultimately terminating in the descending duodenum. A spot fluoroscopic image was saved for documentation purposes. The tube was affixed to the patient's nose with tape. The patient tolerated the procedure well without immediate postprocedural complication. IMPRESSION: Successful fluoroscopic guided placement of Dobbhoff tube with tip terminating in the descending duodenum. The tube is ready for immediate use. This exam was performed by [facility] PA-C, and was supervised and interpreted by Dr.  VEAR Lent. Electronically Signed   By: Wilkie Lent M.D.   On: 03/27/2024 16:27    Time spent.  50 minutes  This record has been created using Conservation officer, historic buildings. Errors have been sought and corrected,but may not always be located. Such creation errors do not reflect on the standard of care.    LOS: 10 days   Amaryllis Dare  [facility]   Pager on [email or web address]. If 7PM-7AM, please contact night-coverage at [email or web address]  03/28/2024, 3:51 PM

## 2024-03-28 NOTE — Plan of Care (Signed)

## 2024-03-28 NOTE — Progress Notes (Signed)
 Daily Progress Note   Patient Name: Marie Pham       Date: 03/28/2024 DOB: 07/30/1945  Age: 78 y.o. MRN#: [id number] Attending Physician: Caleen Qualia, MD Primary Care Physician: Tobie Domino, MD Admit Date: 03/17/2024  Reason for Consultation/Follow-up: Establishing goals of care  Subjective: Ongoing epic chat with attending service, unit director, and others.  Notes and labs reviewed. In to see patient. She is currently resting in bed with eyes closed, no distress noted.  Her daughter is at bedside.  Daughter discusses that prior to coming to the hospital patient was very much alert and oriented and spoke with numerous family members daily on the phone.  She states that she was alone during the day while other family members were not there.  She discusses the patient's decline over the days prior to the hospital and her care since she has been here.    She confirms DNR status, and otherwise discusses desire for feeding tube and any other care necessary for her to live longer.  She states she is advocating for what she knows that her mother would .  She discusses a plan for PEG tube placement and discharged to [facility] rehab where she lives.  Prior to my departure, patient had some work of breathing noted.she did not track movement or close her eyes when her daughter's hand was brought close to them.  Nursing made aware and plans to boost patient up in bed.  Those involved in epic chat were updated upon my departure.  Length of Stay: 10  Current Medications: Scheduled Meds:   vitamin C  500 mg Per Tube BID   aspirin   81 mg Per NG tube Daily   atorvastatin   80 mg Per NG tube Daily   carvedilol   12.5 mg Per NG tube BID WC   cyanocobalamin   1,000 mcg Intramuscular Q0600    feeding supplement (PROSource TF20)  60 mL Per Tube Daily   fiber supplement (BANATROL TF)  60 mL Per Tube BID   free water   200 mL Per Tube Q4H   insulin aspart  0-15 Units Subcutaneous Q4H   latanoprost  1 drop Both Eyes QHS   losartan   25 mg Per Tube Daily   multivitamin with minerals  1 tablet Per Tube Daily   mouth rinse  15  mL Mouth Rinse 4 times per day   zinc sulfate (50mg  elemental zinc)  220 mg Per Tube Daily    Continuous Infusions:  ceFEPime (MAXIPIME) IV Stopped (03/28/24 1158)   feeding supplement (OSMOLITE 1.5 CAL) 50 mL/hr at 03/27/24 0402   metronidazole Stopped (03/28/24 1400)   vancomycin Stopped (03/28/24 1539)    PRN Meds: acetaminophen  **OR** acetaminophen  (TYLENOL ) oral liquid 160 mg/5 mL **OR** acetaminophen , ibuprofen, lip balm, mouth rinse  Physical Exam Pulmonary:     Comments: Some work of breathing noted prior to my departure Skin:    General: Skin is warm and dry.  Neurological:     Mental Status: She is alert.             Vital Signs: BP (!) 131/56 (BP Location: Right Arm)   Pulse 91   Temp 98.8 F (37.1 C) (Axillary)   Resp (!) 37   Ht 5' 5 (1.651 m)   Wt 85.8 kg   SpO2 98%   BMI 31.48 kg/m  SpO2: SpO2: 98 % O2 Device: O2 Device: Nasal Cannula O2 Flow Rate: O2 Flow Rate (L/min): 0.5 L/min  Intake/output summary:  Intake/Output Summary (Last 24 hours) at 03/28/2024 1630 Last data filed at 03/28/2024 1252 Gross per 24 hour  Intake 1320 ml  Output 1100 ml  Net 220 ml   LBM: Last BM Date : 03/28/24 Baseline Weight: Weight: 83.5 kg Most recent weight: Weight:  (cant get weight on bed)   Patient Active Problem List   Diagnosis Date Noted   Fever 03/28/2024   Severe aortic stenosis 03/19/2024   Pneumonitis 03/19/2024   AKI (acute kidney injury) 03/18/2024   Hypernatremia 03/18/2024   Abnormal EKG 03/18/2024   SIRS (systemic inflammatory response syndrome) (HCC) 03/18/2024   Acute CVA (cerebrovascular accident) (HCC) 03/17/2024    BP (high blood pressure) 10/02/2014   Asthma, chronic 10/02/2014   Essential hypertension 10/02/2014   Obesity 10/02/2014   Sleep apnea 10/02/2014    Palliative Care Assessment & Plan    Recommendations/Plan: Plans for PEG tube and discharged to [facility] facility.  Code Status:    Code Status Orders  (From admission, onward)           Start     Ordered   03/19/24 1605  Do not attempt resuscitation (DNR) Pre-Arrest Interventions Desired  (Code Status)  Continuous       Question Answer Comment  If pulseless and not breathing No CPR or chest compressions.   In Pre-Arrest Conditions (Patient Has Pulse and Is Breathing) May intubate, use advanced airway interventions and cardioversion/ACLS medications if appropriate or indicated. May transfer to ICU.   Consent: Discussion documented in EHR or advanced directives reviewed      03/19/24 1604           Code Status History     Date Active Date Inactive Code Status Order ID Comments User Context   03/19/2024 1250 03/19/2024 1604 Full Code [id number]  Jens Durand, MD Inpatient   03/18/2024 1558 03/19/2024 1250 Limited: Do not attempt resuscitation (DNR) -DNR-LIMITED -Do Not Intubate/DNI  [id number]  Jens Durand, MD Inpatient   03/18/2024 0059 03/18/2024 1558 Full Code [id number]  Cleatus Delayne GAILS, MD Inpatient       Camelia Lewis, NP  Please contact Palliative Medicine Team phone at [phone] for questions and concerns.

## 2024-03-28 NOTE — Progress Notes (Signed)
 Pharmacy Antibiotic Note  Marie Pham is a 78 y.o. female admitted on 03/17/2024 with bacteremia. Patient has been having intermittent fevers since 11/28.  She is on her 2nd course of antibiotics this admit.  Blood cultures were drawn 12/1 during febrile episode and results thus far are GPC in both bottles of one set (2nd set NGTD), BCID detected MRSE. Pharmacy has been consulted for vancomycin dosing.  Today, 03/28/2024 Day 4 Ceftriaxone /metronidazole.  Ceftriaxone  changed to cefepime today,  vancomycin added Renal: SCr Stable, WNL Tm/24h 100.6 WBC trending up to 15 12/1 Bcx: GPC both bottles on one set (2nd set NGTD), BCID MRSE 12/2 repeat blood cx prior to starting vancomycin:   Plan: Vancomycin 1500mg  IV x 1 now then q24h starting 12/3 at 10am Calculated AUC 488 using SCr 0.8 and Vd 0.72 Est trough 11.4 mcg/mL Follow renal function and duration of vancomycin to determine need for vancomycin levels Follow culture results Cefepime 2gm IV q8h - dose appropriately at this time - watch for worsening neurologic function   Height: 5' 5 (165.1 cm) Weight:  (cant get weight on bed) IBW/kg (Calculated) : 57  Temp (24hrs), Avg:99.7 F (37.6 C), Min:98.8 F (37.1 C), Max:100.6 F (38.1 C)  Recent Labs  Lab 03/22/24 0426 03/23/24 0940 03/24/24 0457 03/25/24 0535 03/26/24 0519 03/27/24 1210 03/28/24 0513  WBC 9.9  --   --   --   --  13.2* 15.0*  CREATININE 0.65   < > 0.67 0.81 0.73 0.73 0.68   < > = values in this interval not displayed.    Estimated Creatinine Clearance: 65.7 mL/min (by C-G formula based on SCr of 0.68 mg/dL).    Allergies  Allergen Reactions   Lansoprazole Nausea And Vomiting   Penicillins Hives   Prozac [Fluoxetine] Hives   Bupropion Rash    Antimicrobials this admission: 11/22 Ceftriaxone  >>11/26, 11/29 >> 12/1 11/23 azith >> 11/26 11/29 metronidazole >> 12/2 cefepime >> 12/2 vancomycin >>   Thank you for allowing pharmacy to be a part of this  patient's care.  Martha Ellerby, PharmD, BCPS, BCIDP Work Cell: [phone] 03/28/2024 11:50 AM

## 2024-03-28 NOTE — Plan of Care (Signed)
  Problem: Clinical Measurements: Goal: Cardiovascular complication will be avoided Outcome: Progressing   Problem: Nutrition: Goal: Adequate nutrition will be maintained Outcome: Progressing   Problem: Elimination: Goal: Will not experience complications related to urinary retention Outcome: Progressing   Problem: Pain Managment: Goal: General experience of comfort will improve and/or be controlled Outcome: Progressing   Problem: Safety: Goal: Ability to remain free from injury will improve Outcome: Progressing   Problem: Nutrition: Goal: Dietary intake will improve Outcome: Progressing   Problem: Metabolic: Goal: Ability to maintain appropriate glucose levels will improve Outcome: Progressing   Problem: Nutritional: Goal: Maintenance of adequate nutrition will improve Outcome: Progressing Goal: Progress toward achieving an optimal weight will improve Outcome: Progressing   Problem: Skin Integrity: Goal: Risk for impaired skin integrity will decrease Outcome: Progressing   Problem: Tissue Perfusion: Goal: Adequacy of tissue perfusion will improve Outcome: Progressing

## 2024-03-29 ENCOUNTER — Inpatient Hospital Stay: Admitting: Radiology

## 2024-03-29 ENCOUNTER — Inpatient Hospital Stay

## 2024-03-29 DIAGNOSIS — I639 Cerebral infarction, unspecified: Secondary | ICD-10-CM | POA: Diagnosis not present

## 2024-03-29 HISTORY — PX: IR GASTROSTOMY TUBE MOD SED: IMG625

## 2024-03-29 LAB — RENAL FUNCTION PANEL
Albumin: 3.2 g/dL — ABNORMAL LOW (ref 3.5–5.0)
Anion gap: 8 (ref 5–15)
BUN: 22 mg/dL (ref 8–23)
CO2: 32 mmol/L (ref 22–32)
Calcium: 9.1 mg/dL (ref 8.9–10.3)
Chloride: 107 mmol/L (ref 98–111)
Creatinine, Ser: 0.65 mg/dL (ref 0.44–1.00)
GFR, Estimated: >60 mL/min (ref >60)
Glucose, Bld: 111 mg/dL — ABNORMAL HIGH (ref 70–99)
Phosphorus: 4.3 mg/dL (ref 2.5–4.6)
Potassium: 4.5 mmol/L (ref 3.5–5.1)
Sodium: 147 mmol/L — ABNORMAL HIGH (ref 135–145)

## 2024-03-29 LAB — CBC WITH DIFFERENTIAL/PLATELET
Abs Immature Granulocytes: 0.07 K/uL (ref 0.00–0.07)
Basophils Absolute: 0.1 K/uL (ref 0.0–0.1)
Basophils Relative: 0 %
Eosinophils Absolute: 0.4 K/uL (ref 0.0–0.5)
Eosinophils Relative: 2 %
HCT: 38.5 % (ref 36.0–46.0)
Hemoglobin: 12.3 g/dL (ref 12.0–15.0)
Immature Granulocytes: 0 %
Lymphocytes Relative: 14 %
Lymphs Abs: 2.4 K/uL (ref 0.7–4.0)
MCH: 28.9 pg (ref 26.0–34.0)
MCHC: 31.9 g/dL (ref 30.0–36.0)
MCV: 90.4 fL (ref 80.0–100.0)
Monocytes Absolute: 1.3 K/uL — ABNORMAL HIGH (ref 0.1–1.0)
Monocytes Relative: 7 %
Neutro Abs: 13.5 K/uL — ABNORMAL HIGH (ref 1.7–7.7)
Neutrophils Relative %: 77 %
Platelets: 296 K/uL (ref 150–400)
RBC: 4.26 MIL/uL (ref 3.87–5.11)
RDW: 13.7 % (ref 11.5–15.5)
WBC: 17.6 K/uL — ABNORMAL HIGH (ref 4.0–10.5)
nRBC: 0 % (ref 0.0–0.2)

## 2024-03-29 LAB — GLUCOSE, CAPILLARY
Glucose-Capillary: 109 mg/dL — ABNORMAL HIGH (ref 70–99)
Glucose-Capillary: 114 mg/dL — ABNORMAL HIGH (ref 70–99)
Glucose-Capillary: 128 mg/dL — ABNORMAL HIGH (ref 70–99)
Glucose-Capillary: 151 mg/dL — ABNORMAL HIGH (ref 70–99)
Glucose-Capillary: 167 mg/dL — ABNORMAL HIGH (ref 70–99)
Glucose-Capillary: 175 mg/dL — ABNORMAL HIGH (ref 70–99)

## 2024-03-29 LAB — PRO BRAIN NATRIURETIC PEPTIDE: Pro Brain Natriuretic Peptide: 286 pg/mL (ref <300.0)

## 2024-03-29 LAB — CK: Total CK: 139 U/L (ref 38–234)

## 2024-03-29 MED ORDER — FUROSEMIDE 10 MG/ML IJ SOLN
20.0000 mg | Freq: Once | INTRAMUSCULAR | Status: AC
  Administered 2024-03-29: 20 mg via INTRAVENOUS
  Filled 2024-03-29: qty 2

## 2024-03-29 MED ORDER — FREE WATER
200.0000 mL | Status: DC
  Administered 2024-03-29 – 2024-03-30 (×5): 200 mL

## 2024-03-29 MED ORDER — IOHEXOL 300 MG/ML  SOLN
15.0000 mL | Freq: Once | INTRAMUSCULAR | Status: AC | PRN
  Administered 2024-03-29: 15 mL

## 2024-03-29 MED ORDER — BANATROL TF EN LIQD
60.0000 mL | Freq: Two times a day (BID) | ENTERAL | Status: DC
  Administered 2024-03-29 – 2024-04-01 (×6): 60 mL

## 2024-03-29 MED ORDER — MORPHINE SULFATE (PF) 2 MG/ML IV SOLN
1.0000 mg | INTRAVENOUS | Status: DC | PRN
  Administered 2024-03-29 – 2024-04-01 (×2): 2 mg via INTRAVENOUS
  Filled 2024-03-29 (×2): qty 1

## 2024-03-29 MED ORDER — FENTANYL CITRATE (PF) 100 MCG/2ML IJ SOLN
INTRAMUSCULAR | Status: AC
  Filled 2024-03-29: qty 2

## 2024-03-29 MED ORDER — OSMOLITE 1.5 CAL PO LIQD
1000.0000 mL | ORAL | Status: DC
  Administered 2024-03-29 – 2024-03-30 (×2): 1000 mL

## 2024-03-29 MED ORDER — IPRATROPIUM-ALBUTEROL 0.5-2.5 (3) MG/3ML IN SOLN
3.0000 mL | RESPIRATORY_TRACT | Status: DC | PRN
  Administered 2024-03-29: 3 mL via RESPIRATORY_TRACT
  Filled 2024-03-29: qty 3

## 2024-03-29 MED ORDER — FENTANYL CITRATE (PF) 100 MCG/2ML IJ SOLN
INTRAMUSCULAR | Status: AC | PRN
  Administered 2024-03-29: 25 ug via INTRAVENOUS

## 2024-03-29 MED ORDER — MORPHINE SULFATE (PF) 2 MG/ML IV SOLN: 2.0000 mg | Freq: Once | INTRAVENOUS | Status: DC

## 2024-03-29 MED ORDER — LIDOCAINE HCL 1 % IJ SOLN
20.0000 mL | Freq: Once | INTRAMUSCULAR | Status: AC
  Administered 2024-03-29: 20 mL via INTRADERMAL
  Filled 2024-03-29: qty 20

## 2024-03-29 MED ORDER — LIDOCAINE HCL 1 % IJ SOLN
INTRAMUSCULAR | Status: AC
  Filled 2024-03-29: qty 20

## 2024-03-29 MED ORDER — STERILE WATER FOR INJECTION IJ SOLN
INTRAMUSCULAR | Status: AC
  Filled 2024-03-29: qty 10

## 2024-03-29 MED ORDER — CLOPIDOGREL BISULFATE 75 MG PO TABS
75.0000 mg | ORAL_TABLET | Freq: Every day | ORAL | Status: DC
  Administered 2024-03-30 – 2024-04-04 (×6): 75 mg
  Filled 2024-03-29 (×6): qty 1

## 2024-03-29 MED ORDER — GLUCAGON HCL RDNA (DIAGNOSTIC) 1 MG IJ SOLR
INTRAMUSCULAR | Status: AC
  Filled 2024-03-29: qty 1

## 2024-03-29 MED ORDER — GLUCAGON HCL RDNA (DIAGNOSTIC) 1 MG IJ SOLR
INTRAMUSCULAR | Status: AC | PRN
  Administered 2024-03-29: .5 mg via INTRAVENOUS

## 2024-03-29 MED ORDER — COLLAGENASE 250 UNIT/GM EX OINT
TOPICAL_OINTMENT | Freq: Every day | CUTANEOUS | Status: DC
  Administered 2024-04-01: 1 via TOPICAL
  Filled 2024-03-29: qty 30

## 2024-03-29 MED ORDER — JUVEN PO PACK
1.0000 | PACK | Freq: Two times a day (BID) | ORAL | Status: DC
  Administered 2024-03-30 – 2024-04-04 (×12): 1

## 2024-03-29 NOTE — Consult Note (Signed)
 WOC Nurse Follow up note:   Wound type: Deep Tissue Pressure Injury initially, in evolution; now with non viable tissue on the left buttock; partial thickness skin loss over the remaining affected areas  Measurement:see nursing flowsheet  Wound bed: as above  Drainage (amount, consistency, odor) see nursing flowsheet  Periwound: intact  Dressing procedure/placement/frequency: Cleanse sacrum/coccyx/buttocks with Vashe wound cleanser [name] ([phone]) do not rinse. Apply Santyl to the left buttock; cover entire area with Xeroform gauze [name] [phone]) daily and secure with silicone foam or ABD pad and tape whichever is preferred.  LALM in place for moisture management and pressure redistribution.  Turn and reposition per hospital policy  Add RD for wound supplementation     WOC Nurse team will follow with you and see patient within 10 days for wound assessments.  Please notify WOC nurses of any acute changes in the wounds or any new areas of concern [facility] MSN, RN,CWOCN, CNS, CWON-AP [phone]

## 2024-03-29 NOTE — Progress Notes (Signed)
 Air bed placed in automatic alternating position for Q36min. Son is at bedside and says he prefers this position change intervention through the night as opposed to Q2h staff member positioning of pt. Instructed son to call staff for any PRN turns he feels that are needed based on perceived pt discomfort. Regular rounding on pt will still continue.

## 2024-03-29 NOTE — Inpatient Diabetes Management (Signed)
 Inpatient Diabetes Program Recommendations  AACE/ADA: New Consensus Statement on Inpatient Glycemic Control (2015)  Target Ranges:  Prepandial:   less than 140 mg/dL      Peak postprandial:   less than 180 mg/dL (1-2 hours)      Critically ill patients:  140 - 180 mg/dL   Lab Results  Component Value Date   GLUCAP 151 (H) 03/29/2024   HGBA1C 6.1 (H) 03/18/2024    Review of Glycemic Control  Diabetes history: None, has prediabetes Outpatient Diabetes medications: None Current orders for Inpatient glycemic control: Novolog 0-15 Q4H  HgbA1C - 6.1% Osmolite 1.5 @ 20 mL/H with goal of 50 mL/H  Inpatient Diabetes Program Recommendations:    If CBGs > 180 mg/dL, add Novolog 2-3 units Q4H to cover TFs. Do not give if TFs on Hold for any reason.  Will follow glucose trends daily.  Thank you. Shona Brandy, RD, LDN, CDCES Inpatient Diabetes Coordinator [phone]

## 2024-03-29 NOTE — Plan of Care (Signed)

## 2024-03-29 NOTE — Progress Notes (Signed)
 Daily Progress Note   Patient Name: Marie Pham       Date: 03/29/2024 DOB: 1945/11/20  Age: 78 y.o. MRN#: [id number] Attending Physician: Al-Sultani, Anmar, MD Primary Care Physician: Tobie Domino, MD Admit Date: 03/17/2024  Reason for Consultation/Follow-up: Establishing goals of care  Subjective: Notes and labs reviewed.  Into see patient.  She is currently resting in bed at this time with her daughter and another family member at bedside.  Patient is not responsive, and tachypnea is noted.  Nursing is currently sending a message to attending team to alert regarding status.  Daughter discusses that they had a difficult night with discomfort.  Discussed plans for PEG tube prior to DC.  Communicated with attending regarding patient's status and plans.  Length of Stay: 11  Current Medications: Scheduled Meds:   vitamin C   500 mg Per Tube BID   aspirin   81 mg Per NG tube Daily   atorvastatin   80 mg Per NG tube Daily   carvedilol   12.5 mg Per NG tube BID WC   collagenase    Topical Daily   cyanocobalamin   1,000 mcg Intramuscular Q0600   feeding supplement (PROSource TF20)  60 mL Per Tube Daily   fiber supplement (BANATROL TF)  60 mL Per Tube BID   free water   200 mL Per Tube Q4H   insulin  aspart  0-15 Units Subcutaneous Q4H   latanoprost   1 drop Both Eyes QHS   losartan   25 mg Per Tube Daily    morphine  injection  2 mg Intravenous Once   multivitamin with minerals  1 tablet Per Tube Daily   mouth rinse  15 mL Mouth Rinse 4 times per day   zinc  sulfate (50mg  elemental zinc )  220 mg Per Tube Daily    Continuous Infusions:  feeding supplement (OSMOLITE 1.5 CAL) Stopped (03/29/24 0000)   meropenem  (MERREM ) IV 1 g (03/29/24 0547)   vancomycin  1,500 mg (03/29/24 1047)    PRN  Meds: acetaminophen  **OR** acetaminophen  (TYLENOL ) oral liquid 160 mg/5 mL **OR** acetaminophen , ibuprofen , ipratropium-albuterol , lip balm, morphine  injection, mouth rinse  Physical Exam          Vital Signs: BP (!) 130/52 (BP Location: Right Arm)   Pulse 91   Temp 99.9 F (37.7 C) (Axillary)   Resp (!) 29   Ht 5'  5 (1.651 m)   Wt 85.8 kg   SpO2 98%   BMI 31.48 kg/m  SpO2: SpO2: 98 % O2 Device: O2 Device: Nasal Cannula O2 Flow Rate: O2 Flow Rate (L/min): 1 L/min  Intake/output summary:  Intake/Output Summary (Last 24 hours) at 03/29/2024 1255 Last data filed at 03/29/2024 0800 Gross per 24 hour  Intake 660 ml  Output 1550 ml  Net -890 ml   LBM: Last BM Date : 03/28/24 Baseline Weight: Weight: 83.5 kg Most recent weight: Weight:  (cant get weight on bed)    Patient Active Problem List   Diagnosis Date Noted   Fever 03/28/2024   Severe aortic stenosis 03/19/2024   Pneumonitis 03/19/2024   AKI (acute kidney injury) 03/18/2024   Hypernatremia 03/18/2024   Abnormal EKG 03/18/2024   SIRS (systemic inflammatory response syndrome) (HCC) 03/18/2024   Acute CVA (cerebrovascular accident) (HCC) 03/17/2024   BP (high blood pressure) 10/02/2014   Asthma, chronic 10/02/2014   Essential hypertension 10/02/2014   Obesity 10/02/2014   Sleep apnea 10/02/2014    Palliative Care Assessment & Plan    Recommendations/Plan: DNR, full scope of treatment.  Code Status:    Code Status Orders  (From admission, onward)           Start     Ordered   03/19/24 1605  Do not attempt resuscitation (DNR) Pre-Arrest Interventions Desired  (Code Status)  Continuous       Question Answer Comment  If pulseless and not breathing No CPR or chest compressions.   In Pre-Arrest Conditions (Patient Has Pulse and Is Breathing) May intubate, use advanced airway interventions and cardioversion/ACLS medications if appropriate or indicated. May transfer to ICU.   Consent: Discussion documented  in EHR or advanced directives reviewed      03/19/24 1604           Code Status History     Date Active Date Inactive Code Status Order ID Comments User Context   03/19/2024 1250 03/19/2024 1604 Full Code [id number]  Jens Durand, MD Inpatient   03/18/2024 1558 03/19/2024 1250 Limited: Do not attempt resuscitation (DNR) -DNR-LIMITED -Do Not Intubate/DNI  [id number]  Jens Durand, MD Inpatient   03/18/2024 0059 03/18/2024 1558 Full Code [id number]  Cleatus Delayne GAILS, MD Inpatient      Camelia Lewis, NP  Please contact Palliative Medicine Team phone at [phone] for questions and concerns.

## 2024-03-29 NOTE — Progress Notes (Signed)
 PT Cancellation Note  Patient Details Name: Marie Pham MRN: [id number] DOB: [birth year]   Cancelled Treatment:     Pt unavailable earlier today, off floor for PEG placement. Will re-attempt next available date/time per POC and appropriateness.    [name] 03/29/2024, 5:42 PM

## 2024-03-29 NOTE — Progress Notes (Signed)
 PROGRESS NOTE    Marie Pham  FMW:[id number] DOB: 1945-12-17 DOA: 03/17/2024 PCP: Tobie Domino, MD    Brief Narrative:  Marie Pham is a 78 y.o. female with medical history significant for hypertension, chronic back pain, arthritis, sleep apnea, depression, asthma/bronchitis, ambulates with a walker at baseline who presented to the hospital because of poor oral intake and speech difficulties.  Family had tried to communicate with her and she appeared to understand what her son was saying but she could not respond.  Onset or duration of speech difficulty is not clear.  In the ED, apparently patient indicated that her back hurts.   Vitals in the ED: Temperature 98.7 F, respiratory rate 18, pulse 115, BP 120/99 oxygen saturation 95% on room air.   Troponins negative, mildly elevated D-dimer 0.92. EKG shows sinus tachycardia, right bundle branch block, prolonged QTc at 494   CT angiogram of head and neck showed acute left basal ganglia infarct.   Chest x-ray 1. Linear atelectasis in the left base and right mid lung, which may contribute to shortness of breath. 2. No acute cardiopulmonary process identified.   She was admitted to the hospital for acute stroke.   11/26: Patient very somnolent and difficult to arouse, EEG was also ordered.  Family would like to continue full scope of care, might need a PEG tube placement.  Currently being fed with corTrack.   11/27: No change in mentation, remained minimally responsive to obnoxious stimuli.  Repeat ABG with mild hypercarbia at 49 which does not explain this encephalopathy, ammonia mildly elevated at 37, we will give her a trial of bicarb, also adding lactulose .  Repeating brain MRI and neurology was reconsulted.   1 episode of A-fib was mentioned on her monitor on 11/23, talked with central monitoring system and it was PACs, she never had any documented atrial fibrillation.   11/28: Patient remained unresponsive, likely will remain in  vegetative state.  IR was consulted for PEG tube placement and Plavix  need to be held for 5 days, most likely will get her PEG on 03/29/2024.  TOC to work on placement.  Daughter works in a facility in [place] and trying to get her there. Family would like to continue current level of care to see if there is any improvement in her mental status over the next few months before thinking about transitioning to hospice.  Neurology is not very hopeful.    Repeat MRI yesterday with progressed bilateral ACA territory infarct with cytotoxic edema, left greater than right, no hemorrhagic transformation or mass effect.  Also noted evolution of left caudate infarct with possible petechial hemorrhage but no malignant hemorrhagic transformation or mass effect.  No new vascular territory ischemia.   Developed diarrhea so holding MiraLAX  and lactulose .   11/29: Remained unresponsive, family concern of developing pressure injuries and nurses are not changing position every 2 hourly as recommended-discussed with nursing staff.  Air mattress was also ordered.  Persistent hypernatremia at 150, increasing the frequency of free water  to Q4 hourly.   Patient developed low-grade fever overnight, had an aspiration episode on 11/27-starting on ceftriaxone  and Flagyl.   11/30: Hemodynamically stable, improving hyponatremia now at 148.  Patient started breathing a little heavy when did the sternal rub, apparently she did open her eyes and look at daughter earlier.   12/1: Patient became febrile again with temperature of 101.  Blood cultures ordered.  Repeat chest x-ray and abdominal x-ray ordered.  Having some diarrhea but it  was not watery. Feeding tube fell off-ordered another Dobbhoff placement. ID was also consulted for recurrent fever.   12/2: Patient remained febrile,C. difficile negative,1/4 blood cultures with Staph epidermidis which is likely a contaminant but repeat cultures ordered.  Antibiotics were broadened  with cefepime from ceftriaxone , vancomycin was also added per ID.  12/3: Initially afebrile all day. Underwent placement of PEG tube, then noted to be febrile to 101 F intra-procedure. Continued to be tachypneic throughout the day.   Prognosis remains extremely guarded.    Assessment and Plan:  Acute bilateral ACA strokes Cytotoxic edema Persistent encephalopathy - Patient with bilateral frontal lobe and basal ganglia infarct - TTE showed EF estimated at 60 to 65%, grade 3 diastolic dysfunction, severe aortic valve stenosis. - Patient continues to have significant neurologic deficits, briefly opens eyes to  - S/p NG tube placement by IR on 03/20/2024 - EEG with generalized cortical dysfunction, no epileptiform discharges. - Repeat brain MRI with progress bilateral ACA territory infarct, cytogenic edema with no mass effect or hemorrhagic transformation, please see the full report. - PT and OT recommended discharge to SNF. 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  This is probably due to aspiration.   - Completed a 5-day course of antibiotics with ceftriaxone  and Zithromax   CXR 03/21/2024 because rapid response was called in the early hours of the morning for tachypnea.  Chest x-ray showed low lung volumes with bronchovascular crowding but there was no focal consolidation. 11/29: Became febrile again, had another episode of aspiration on 11/27- was treated with ceftriaxone  and Flagyl.   12/1: Another episode of fever above 101. Leukocytosis to 13.2. Blood cultures were obtained. CXR and KUB were largely unremarkable.  ID  was consulted. Continued on ceftriaxone  and Flagyl for now   12/2: Slight fever to 100.5, leukocytosis worsening to 15.0. ID following. 1 of 2 blood cultures positive for MRSE. Vancomycin was added. Broadened to cefepime, but ultimately revised to meropenem by ID due to concern cefepime worsening encephalopathy. Repeat blood culture ordered due to concern for MRSE being a contaminant. C. Diff PCR negative.  Fevers are also very likely to be central fevers from paroxysmal sympathetic hyperactivity given extent of her stroke and cytotoxic edema.   12/3: Initially afebrile, but ultimately febrile to 101 while getting PEG tube placed. Leukocytosis worsening to 17.6. Persistently tachypneic. CXR showed low lung volumes with bronchovascular crowding with no pleural effusion or consolidation. Continue   Severe aortic stenosis, grade 3 diastolic dysfunction: Will need outpatient follow-up with cardiologist.  Dysphagia Secondary to CVA as noted above PEG tube placed on 12/3 RD following for continuation of tube feeds  Hypernatremia, dehydration - improving - Na continues to fluctuate slightly worse today to 147 - Continue with current frequency of free water  - Continue to monitor sodium   Hypertension: Blood pressure within goal  Continue carvedilol .   Chronic back pain/lumbar radiculitis - Pain management - Currently patient is unresponsive and unable to indicate pain level   Hyperglycemia.  CBG elevated with the start of tube feed, with most recent A1c of 6.1 on 11/22. SSI added.   Sacral pressure wound   - Mattress was switched to air mattress -  Change position every 2 hour - Wound care consulted and following   DVT prophylaxis: Place and maintain sequential compression device Start: 03/28/24 1531   Code Status:   Code Status: Do not attempt resuscitation (DNR) PRE-ARREST INTERVENTIONS DESIRED  Family Communication: Discussed patient's current plan of care and extremely guarded  prognosis with patient's daughter at bedside  Disposition Plan: Discharge to SNF  PT - Follow Up Recommendations: Long-term institutional care without follow-up therapy OT - Follow Up Recommendations: Long-term institutional care without follow-up therapy  DME Needs: PT equipment: Other (comment) (ongoing assessment due to lack of progress)     Level of care: Progressive  Consultants:  Neurology, infectious disease  Procedures:  IR Dobhoff placement - 11/24 IR PEG tube placement - 12/3  Antimicrobials: Vancomycin and meropenem   Subjective: Patient evaluated at bedside.  Continues to be very minimally responsive, only opening eyes briefly to voice without any meaningful or purposeful movements, tracking, or ability to follow commands.  Continues to be tachypneic and with shallow breaths.  Objective: Vitals:   03/29/24 1500 03/29/24 1514 03/29/24 1531 03/29/24 1600  BP: 116/61 116/61 131/63 (!) 138/59  Pulse: 95 (!) 101 (!) 104 100  Resp: (!) 32 (!) 34 (!) 25 (!) 30  Temp: (!) 101 F (38.3 C)   100 F (37.8 C)  TempSrc: Oral   Axillary  SpO2: 97% 99% 99% 97%  Weight: 85.8 kg     Height: 5' 5 (1.651 m)       Intake/Output Summary (Last 24 hours) at 03/29/2024 1854 Last data filed at 03/29/2024 1807 Gross per 24 hour  Intake 600 ml  Output 1800 ml  Net -1200 ml   Filed Weights   03/25/24 0345 03/26/24 0456 03/29/24 1500  Weight: 85.1 kg 85.8 kg 85.8 kg    Examination:  Physical Exam Constitutional:      Appearance: She is ill-appearing.     Interventions: Nasal cannula in place.     Comments: Very minimally responsive  HENT:     Mouth/Throat:     Mouth: Mucous membranes are dry.  Eyes:     Comments: Pupils equal, constricted, not reactive, no purposeful eye movement, does not track. Opens eyes briefly to voice.  Cardiovascular:     Rate and Rhythm: Normal rate and regular rhythm.     Heart sounds: Murmur heard.     Systolic murmur is present.   Pulmonary:     Effort: Pulmonary effort is normal. Tachypnea present. No respiratory distress.     Comments: Unable to auscultate posterior lung fields. Anteriorly, mild expiratory wheezing noted on the left, clear to auscultation on the right. No accessory muscle use appreciated.  Abdominal:     General: There is no distension.     Palpations: Abdomen is soft.     Tenderness: There is no abdominal tenderness. There is no guarding.  Genitourinary:    Comments: Purewick in place. FMS in place.  Musculoskeletal:     Right lower leg: No edema.     Left lower leg: No edema.  Skin:    General: Skin is warm and dry.     Capillary Refill: Capillary refill takes less than 2 seconds.  Neurological:     Mental Status: She is unresponsive.     GCS: GCS eye subscore is 3. GCS verbal subscore is 1. GCS motor subscore is 4.     Data Reviewed: I have personally reviewed following labs and imaging studies  CBC: Recent Labs  Lab  03/27/24 1210 03/28/24 0513 03/29/24 0449  WBC 13.2* 15.0* 17.6*  NEUTROABS 8.7* 10.6* 13.5*  HGB 11.9* 12.2 12.3  HCT 37.6 38.5 38.5  MCV 89.7 90.6 90.4  PLT 254 265 296   Basic Metabolic Panel: Recent Labs  Lab 03/25/24 0535 03/26/24 0519 03/27/24 1210 03/28/24 0513 03/29/24 0449  NA 150* 148* 146* 145 147*  K 4.7 4.4 4.5 4.8 4.5  CL 110 109 108 107 107  CO2 34* 32 31 29 32  GLUCOSE 161* 201* 231* 227* 111*  BUN 25* 28* 26* 21 22  CREATININE 0.81 0.73 0.73 0.68 0.65  CALCIUM  9.2 8.9 8.7* 8.9 9.1  PHOS 4.1 3.2 4.0 3.8 4.3   GFR: Estimated Creatinine Clearance: 65.7 mL/min (by C-G formula based on SCr of 0.65 mg/dL). Liver Function Tests: Recent Labs  Lab 03/25/24 0535 03/26/24 0519 03/27/24 1210 03/28/24 0513 03/29/24 0449  AST  --   --   --  39  --   ALT  --   --   --  33  --   ALKPHOS  --   --   --  81  --   BILITOT  --   --   --  0.2  --   PROT  --   --   --  6.6  --   ALBUMIN 3.2* 3.1* 3.1* 3.2*  3.2* 3.2*   No results for  input(s): LIPASE, AMYLASE in the last 168 hours. Recent Labs  Lab 03/23/24 1028  AMMONIA 37*   Coagulation Profile: No results for input(s): INR, PROTIME in the last 168 hours. Cardiac Enzymes: Recent Labs  Lab 03/29/24 0449  CKTOTAL 139   BNP (last 3 results) Recent Labs    03/29/24 0449  PROBNP 286.0   HbA1C: No results for input(s): HGBA1C in the last 72 hours. CBG: Recent Labs  Lab 03/29/24 0012 03/29/24 0347 03/29/24 0732 03/29/24 1151 03/29/24 1600  GLUCAP 175* 109* 128* 151* 167*   Lipid Profile: No results for input(s): CHOL, HDL, LDLCALC, TRIG, CHOLHDL, LDLDIRECT in the last 72 hours. Thyroid  Function Tests: No results for input(s): TSH, T4TOTAL, FREET4, T3FREE, THYROIDAB in the last 72 hours. Anemia Panel: No results for input(s): VITAMINB12, FOLATE, FERRITIN, TIBC, IRON, RETICCTPCT in the last 72 hours. Sepsis Labs: No results for input(s): PROCALCITON, LATICACIDVEN in the last 168 hours.  Recent Results (from the past 240 hours)  Culture, blood (Routine X 2) w Reflex to ID Panel     Status: None (Preliminary result)   Collection Time: 03/27/24 12:10 PM   Specimen: BLOOD  Result Value Ref Range Status   Specimen Description BLOOD BLOOD RIGHT ARM  Final   Special Requests   Final    BOTTLES DRAWN AEROBIC AND ANAEROBIC Blood Culture adequate volume   Culture   Final    NO GROWTH 2 DAYS Performed at [facility], [address] [place], [place] [address]    Report Status PENDING  Incomplete  Culture, blood (Routine X 2) w Reflex to ID Panel     Status: Abnormal (Preliminary result)   Collection Time: 03/27/24 12:10 PM   Specimen: BLOOD LEFT ARM  Result Value Ref Range Status   Specimen Description   Final    BLOOD LEFT ARM Performed at [facility], [address] [place], [place] [address]    Special Requests   Final    BOTTLES DRAWN AEROBIC AND ANAEROBIC Blood Culture adequate  volume Performed at [facility], [address] [place], [place] [address]    Culture  Setup Time   Final    GRAM POSITIVE COCCI IN BOTH AEROBIC AND ANAEROBIC BOTTLES CRITICAL RESULT CALLED TO, READ BACK BY AND VERIFIED WITH: TREY GREENWOOD 03/28/24 0928 MW    Culture (A)  Final    STAPHYLOCOCCUS EPIDERMIDIS THE SIGNIFICANCE OF ISOLATING THIS ORGANISM FROM A SINGLE SET OF BLOOD CULTURES WHEN MULTIPLE SETS ARE DRAWN IS UNCERTAIN. PLEASE NOTIFY THE MICROBIOLOGY DEPARTMENT WITHIN ONE WEEK IF SPECIATION AND SENSITIVITIES ARE REQUIRED. Performed at [facility], [address], [place], [place] [address]    Report Status PENDING  Incomplete  Blood Culture ID Panel (Reflexed)     Status: Abnormal   Collection Time: 03/27/24 12:10 PM  Result Value Ref Range Status   Enterococcus faecalis NOT DETECTED NOT DETECTED Final   Enterococcus Faecium NOT DETECTED NOT DETECTED Final   Listeria monocytogenes NOT DETECTED NOT DETECTED Final   Staphylococcus species DETECTED (A) NOT DETECTED Final    Comment: CRITICAL RESULT CALLED TO, READ BACK BY AND VERIFIED WITH: TREY GREENWOOD 03/28/24 0928 MW    Staphylococcus aureus (BCID) NOT DETECTED NOT DETECTED Final   Staphylococcus epidermidis DETECTED (A) NOT DETECTED Final    Comment: Methicillin (oxacillin) resistant coagulase negative staphylococcus. Possible blood culture contaminant (unless isolated from more than one blood culture draw or clinical case suggests pathogenicity). No antibiotic treatment is indicated for blood  culture contaminants. CRITICAL RESULT CALLED TO, READ BACK BY AND VERIFIED WITH: TREY GREENWOOD 03/28/24 0928 MW    Staphylococcus lugdunensis NOT DETECTED NOT DETECTED Final   Streptococcus species NOT DETECTED NOT DETECTED Final   Streptococcus agalactiae NOT DETECTED NOT DETECTED Final   Streptococcus pneumoniae NOT DETECTED NOT DETECTED Final   Streptococcus pyogenes NOT DETECTED NOT DETECTED Final    A.calcoaceticus-baumannii NOT DETECTED NOT DETECTED Final   Bacteroides fragilis NOT DETECTED NOT DETECTED Final   Enterobacterales NOT DETECTED NOT DETECTED Final   Enterobacter cloacae complex NOT DETECTED NOT DETECTED Final   Escherichia coli NOT DETECTED NOT DETECTED Final   Klebsiella aerogenes NOT DETECTED NOT DETECTED Final   Klebsiella oxytoca NOT DETECTED NOT DETECTED Final   Klebsiella pneumoniae NOT DETECTED NOT DETECTED Final   Proteus species NOT DETECTED NOT DETECTED Final   Salmonella species NOT DETECTED NOT DETECTED Final   Serratia marcescens NOT DETECTED NOT DETECTED Final   Haemophilus influenzae NOT DETECTED NOT DETECTED Final   Neisseria meningitidis NOT DETECTED NOT DETECTED Final   Pseudomonas aeruginosa NOT DETECTED NOT DETECTED Final   Stenotrophomonas maltophilia NOT DETECTED NOT DETECTED Final   Candida albicans NOT DETECTED NOT DETECTED Final   Candida auris NOT DETECTED NOT DETECTED Final   Candida glabrata NOT DETECTED NOT DETECTED Final   Candida krusei NOT DETECTED NOT DETECTED Final   Candida parapsilosis NOT DETECTED NOT DETECTED Final   Candida tropicalis NOT DETECTED NOT DETECTED Final   Cryptococcus neoformans/gattii NOT DETECTED NOT DETECTED Final   Methicillin resistance mecA/C DETECTED (A) NOT DETECTED Final    Comment: CRITICAL RESULT CALLED TO, READ BACK BY AND VERIFIED WITH: MOSE BLEW 03/28/24 9071 MW Performed at [facility], [address] [place], [place] [address]   C Difficile Quick Screen (NO PCR Reflex)     Status: None   Collection Time: 03/28/24  9:15 AM   Specimen: STOOL  Result Value Ref Range Status   C Diff antigen NEGATIVE NEGATIVE Final   C Diff toxin NEGATIVE NEGATIVE Final   C Diff  interpretation No C. difficile detected.  Final    Comment: Performed at [facility], [address] [place], [place] [address]  Culture, blood (Routine X 2) w Reflex to ID Panel     Status: None (Preliminary result)    Collection Time: 03/28/24 10:46 AM   Specimen: BLOOD RIGHT HAND  Result Value Ref Range Status   Specimen Description BLOOD RIGHT HAND  Final   Special Requests   Final    BOTTLES DRAWN AEROBIC AND ANAEROBIC Blood Culture adequate volume   Culture   Final    NO GROWTH < 24 HOURS Performed at [facility], [address], [place], [place] [address]    Report Status PENDING  Incomplete  Culture, blood (Routine X 2) w Reflex to ID Panel     Status: None (Preliminary result)   Collection Time: 03/28/24 10:46 AM   Specimen: BLOOD RIGHT ARM  Result Value Ref Range Status   Specimen Description BLOOD RIGHT ARM  Final   Special Requests   Final    BOTTLES DRAWN AEROBIC AND ANAEROBIC Blood Culture adequate volume   Culture   Final    NO GROWTH < 24 HOURS Performed at [facility], [address], [place], [place] [address]    Report Status PENDING  Incomplete     Radiology Studies: IR GASTROSTOMY TUBE MOD SED Result Date: 03/29/2024 INDICATION: Dysphagia.  Altered mental status EXAM: PERCUTANEOUS GASTROSTOMY TUBE PLACEMENT COMPARISON:  Chest XR, earlier same day.  CT AP, 03/17/2024. MEDICATIONS: The patient was on scheduled IV antibiotics Glucagon  0.5 mg IV CONTRAST:  15 mL of Isovue  300 administered into the gastric lumen. ANESTHESIA/SEDATION: Local anesthetic and single agent sedation was employed during this procedure. A total of fentanyl  25 mcg was administered intravenously. The patient's level of consciousness and vital signs were monitored continuously by radiology nursing throughout the procedure under my direct supervision. FLUOROSCOPY: Radiation Exposure Index and estimated peak skin dose (PSD); Reference air kerma (RAK), 7 mGy. COMPLICATIONS: None immediate. PROCEDURE: Informed written consent was obtained from the patient and/or patient's representative following explanation of the procedure, risks, benefits and alternatives. A time out was performed prior to the  initiation of the procedure. Maximal barrier sterile technique utilized including caps, mask, sterile gowns, sterile gloves, large sterile drape, hand hygiene and sterile prep. The LEFT costal margin and barium opacified transverse colon were identified and avoided. Air was injected into the stomach for insufflation and visualization under fluoroscopy. Under sterile conditions and local anesthesia, 2 T tacks were utilized to pexy the anterior aspect of the stomach against the ventral abdominal wall. Contrast injection confirmed appropriate positioning of each of the T tacks. An incision was made between the T tacks and a 17 gauge trocar needle was utilized to access the stomach. Needle position was confirmed within the stomach with aspiration of air and injection of a small amount of contrast. A stiff guidewire was advanced into the gastric lumen and under intermittent fluoroscopic guidance, the access needle was exchanged for a telescoping peel-away sheath, ultimately allowing placement of a 18 Fr balloon retention gastrostomy tube. The retention balloon was insufflated with a mixture of dilute saline and contrast and pulled taut against the anterior wall of the stomach. The external disc was cinched. Contrast injection confirms positioning within the stomach. Several spot radiographic images were obtained in various obliquities for documentation. The patient tolerated procedure well without immediate post procedural complication. FINDINGS: After successful fluoroscopic guided placement, the gastrostomy tube is appropriately  positioned with internal retention balloon against the ventral aspect of the gastric lumen. IMPRESSION: Successful fluoroscopic insertion of a 18 Fr balloon retention gastrostomy tube. The gastrostomy may be used immediately for medication administration and in 4 hrs for the initiation of feeds. RECOMMENDATIONS: The patient will return to Vascular Interventional Radiology (VIR) for routine  feeding tube evaluation and exchange in 6 months. Thom Hall, MD [facility] Electronically Signed   By: Thom Hall M.D.   On: 03/29/2024 16:05   DG Chest 1 View Result Date: 03/29/2024 EXAM: 1 VIEW(S) XRAY OF THE CHEST 03/29/2024 01:37:12 AM COMPARISON: 03/27/2024 CLINICAL HISTORY: Dyspnea FINDINGS: LINES, TUBES AND DEVICES: Enteric tube in place, coursing through chest to abdomen beyond field-of-view. LUNGS AND PLEURA: Low lung volumes with bronchovascular crowding. No pleural effusion. No pneumothorax. No focal consolidation. HEART AND MEDIASTINUM: Aortic atherosclerosis. BONES AND SOFT TISSUES: No acute osseous abnormality. IMPRESSION: 1. Pulmonary hypoinflation. Electronically signed by: Dorethia Molt MD 03/29/2024 01:43 AM EST RP Workstation: [id number]    Scheduled Meds:  vitamin C  500 mg Per Tube BID   aspirin   81 mg Per NG tube Daily   atorvastatin   80 mg Per NG tube Daily   carvedilol   12.5 mg Per NG tube BID WC   collagenase   Topical Daily   cyanocobalamin   1,000 mcg Intramuscular Q0600   feeding supplement (PROSource TF20)  60 mL Per Tube Daily   fiber supplement (BANATROL TF)  60 mL Per Tube BID   free water   200 mL Per Tube Q4H   insulin aspart  0-15 Units Subcutaneous Q4H   latanoprost  1 drop Both Eyes QHS   losartan   25 mg Per Tube Daily    morphine injection  2 mg Intravenous Once   multivitamin with minerals  1 tablet Per Tube Daily   [START ON 03/30/2024] nutrition supplement (JUVEN)  1 packet Per Tube BID BM   mouth rinse  15 mL Mouth Rinse 4 times per day   zinc sulfate (50mg  elemental zinc)  220 mg Per Tube Daily   Continuous Infusions:  feeding supplement (OSMOLITE 1.5 CAL)     meropenem (MERREM) IV 1 g (03/29/24 1408)   vancomycin 1,500 mg (03/29/24 1047)     Unresulted Labs (From admission, onward)     Start     Ordered   03/30/24 0500  CBC  Daily,   R     Question:  Specimen collection method   Answer:  Lab=Lab collect   03/29/24 1926   03/30/24 0500  Basic metabolic panel with GFR  Daily,   R     Question:  Specimen collection method  Answer:  Lab=Lab collect   03/29/24 1926   03/29/24 1529  Urinalysis, Complete w Microscopic -Urine, Unspecified Source  Once,   R       Question:  Specimen Source  Answer:  Urine, Unspecified Source   03/29/24 1529   03/28/24 0500  Renal function panel  Daily,   R     Question:  Specimen collection method  Answer:  Lab=Lab collect   03/27/24 1517   03/25/24 0500  Creatinine, serum  (enoxaparin  (LOVENOX )    CrCl >/= 30 ml/min)  Weekly,   TIMED     Comments: while on enoxaparin  therapy    03/18/24 0059             LOS:  LOS: 11 days   Time Spent: 60 minutes  Lichelle Viets Al-Sultani, MD [facility]  If 7PM-7AM, please contact night-coverage  03/29/2024, 6:54 PM

## 2024-03-29 NOTE — TOC Progression Note (Signed)
 Transition of Care [facility]) - Progression Note    Patient Details  Name: Marie Pham MRN: [id number] Date of Birth: [birth year]  Transition of Care [facility]) CM/SW Contact  Lauraine JAYSON Carpen, LCSW Phone Number: 03/29/2024, 10:54 AM  Clinical Narrative:   Medicaid will require a Letter of Incapacitation. MD will have to document details for incapacity to act on her own behalf. MD will complete so CSW can send to the financial counselor.  Expected Discharge Plan: Skilled Nursing Facility Barriers to Discharge: Continued Medical Work up               Expected Discharge Plan and Services     Post Acute Care Choice: Skilled Nursing Facility Living arrangements for the past 2 months: Single Family Home                                       Social Drivers of Health (SDOH) Interventions SDOH Screenings   Food Insecurity: Patient Unable To Answer (03/18/2024)  Housing: Patient Unable To Answer (03/18/2024)  Transportation Needs: Patient Unable To Answer (03/18/2024)  Utilities: Patient Unable To Answer (03/18/2024)  Financial Resource Strain: High Risk (09/01/2023)   Received from [facility]  Social Connections: Patient Unable To Answer (03/18/2024)  Tobacco Use: Medium Risk (03/17/2024)    Readmission Risk Interventions     No data to display

## 2024-03-29 NOTE — Procedures (Signed)
 Vascular and Interventional Radiology Procedure Note  Patient: Marie Pham DOB: 1945-09-07 Medical Record Number: [id number] Note Date/Time: 03/29/24 3:14 PM   Performing Physician: Thom Hall, MD Assistant(s): None  Diagnosis: Dysphagia. AMS  Procedure: PERCUTANEOUS GASTROSTOMY TUBE PLACEMENT  Anesthesia: Conscious Sedation Complications: None Estimated Blood Loss: Minimal  Findings:  Successful placement of a 65F gastrostomy tube under fluoroscopy.   See detailed procedure note with images in PACS. The patient tolerated the procedure well without incident or complication and was returned to Recovery in stable condition.    Thom Hall, MD Vascular and Interventional Radiology Specialists [facility] Radiology   Pager. [phone] Clinic. [phone]

## 2024-03-29 NOTE — Progress Notes (Signed)
 Date of Admission:  03/17/2024     ID: Marie Pham is a 78 y.o. female  Principal Problem:   Acute CVA (cerebrovascular accident) [facility]) Active Problems:   Essential hypertension   AKI (acute kidney injury)   Hypernatremia   Abnormal EKG   SIRS (systemic inflammatory response syndrome) (HCC)   Severe aortic stenosis   Pneumonitis   Fever    Subjective: No fevers since 100.1 yesterday morning.  White count up from 15-17.  Blood cultures December 2 in December 1 are negative except for 1 of 2 growing Staph epidermidis from December 1.  Remains on meropenem and vancomycin.  Chest x-ray no change.    Medications:   vitamin C  500 mg Per Tube BID   aspirin   81 mg Per NG tube Daily   atorvastatin   80 mg Per NG tube Daily   carvedilol   12.5 mg Per NG tube BID WC   collagenase   Topical Daily   cyanocobalamin   1,000 mcg Intramuscular Q0600   feeding supplement (PROSource TF20)  60 mL Per Tube Daily   fiber supplement (BANATROL TF)  60 mL Per Tube BID   free water   200 mL Per Tube Q4H   insulin aspart  0-15 Units Subcutaneous Q4H   latanoprost  1 drop Both Eyes QHS   losartan   25 mg Per Tube Daily    morphine injection  2 mg Intravenous Once   multivitamin with minerals  1 tablet Per Tube Daily   mouth rinse  15 mL Mouth Rinse 4 times per day   zinc sulfate (50mg  elemental zinc)  220 mg Per Tube Daily    Objective: Vital signs in last 24 hours: Patient Vitals for the past 24 hrs:  BP Temp Temp src Pulse Resp SpO2  03/29/24 1149 (!) 130/52 99.9 F (37.7 C) Axillary 91 (!) 29 98 %  03/29/24 1116 -- -- -- 95 (!) 37 97 %  03/29/24 0731 (!) 155/85 99.6 F (37.6 C) Axillary (!) 106 (!) 41 97 %  03/29/24 0348 (!) 152/75 98 F (36.7 C) -- 100 (!) 37 90 %  03/29/24 0017 -- -- -- 98 (!) 37 97 %  03/29/24 0016 -- -- -- 98 (!) 42 97 %  03/29/24 0012 -- -- -- 96 (!) 38 97 %  03/29/24 0011 (!) 143/58 -- -- 98 -- 96 %  03/29/24 0010 -- 98.4 F (36.9 C) -- -- -- --  03/28/24 1915  (!) 133/59 99.4 F (37.4 C) Axillary 93 (!) 30 95 %  03/28/24 1708 (!) 131/56 -- -- 91 -- --  03/28/24 1556 (!) 131/56 98.8 F (37.1 C) Axillary 91 (!) 37 98 %     PHYSICAL EXAM:  General:obtunded- unresponsive.  Eyes: Conjunctivae clear, anicteric sclerae. Pupils are equal Lungs:b/l air entry Heart: Regular rate and rhythm, no murmur, rub or gallop.systolic murmur Abdomen: Soft, obese . Bowel sounds normal. No masses Extremities: atrauamatic Sacrum - skin shearing  large islands  Skin: No rashes or lesions. Or bruising Lymph: Cervical, supraclavicular normal. Neurologic: cannot assess  Lab Results    Latest Ref Rng & Units 03/29/2024    4:49 AM 03/28/2024    5:13 AM 03/27/2024   12:10 PM  CBC  WBC 4.0 - 10.5 K/uL 17.6  15.0  13.2   Hemoglobin 12.0 - 15.0 g/dL 87.6  87.7  88.0   Hematocrit 36.0 - 46.0 % 38.5  38.5  37.6   Platelets 150 - 400 K/uL 296  265  254        Latest Ref Rng & Units 03/29/2024    4:49 AM 03/28/2024    5:13 AM 03/27/2024   12:10 PM  CMP  Glucose 70 - 99 mg/dL 888  772  768   BUN 8 - 23 mg/dL 22  21  26    Creatinine 0.44 - 1.00 mg/dL 9.34  9.31  9.26   Sodium 135 - 145 mmol/L 147  145  146   Potassium 3.5 - 5.1 mmol/L 4.5  4.8  4.5   Chloride 98 - 111 mmol/L 107  107  108   CO2 22 - 32 mmol/L 32  29  31   Calcium  8.9 - 10.3 mg/dL 9.1  8.9  8.7   Total Protein 6.5 - 8.1 g/dL  6.6    Total Bilirubin 0.0 - 1.2 mg/dL  0.2    Alkaline Phos 38 - 126 U/L  81    AST 15 - 41 U/L  39    ALT 0 - 44 U/L  33        Microbiology: Results for orders placed or performed during the hospital encounter of 03/17/24  Culture, blood (Routine X 2) w Reflex to ID Panel     Status: None (Preliminary result)   Collection Time: 03/27/24 12:10 PM   Specimen: BLOOD  Result Value Ref Range Status   Specimen Description BLOOD BLOOD RIGHT ARM  Final   Special Requests   Final    BOTTLES DRAWN AEROBIC AND ANAEROBIC Blood Culture adequate volume   Culture   Final    NO  GROWTH 2 DAYS Performed at [facility], [address], [place], [place] [address]    Report Status PENDING  Incomplete  Culture, blood (Routine X 2) w Reflex to ID Panel     Status: Abnormal (Preliminary result)   Collection Time: 03/27/24 12:10 PM   Specimen: BLOOD LEFT ARM  Result Value Ref Range Status   Specimen Description   Final    BLOOD LEFT ARM Performed at [facility], [address] [place], [place] [address]    Special Requests   Final    BOTTLES DRAWN AEROBIC AND ANAEROBIC Blood Culture adequate volume Performed at [facility], [address] [place], [place] [address]    Culture  Setup Time   Final    GRAM POSITIVE COCCI IN BOTH AEROBIC AND ANAEROBIC BOTTLES CRITICAL RESULT CALLED TO, READ BACK BY AND VERIFIED WITH: TREY GREENWOOD 03/28/24 0928 MW    Culture (A)  Final    STAPHYLOCOCCUS EPIDERMIDIS THE SIGNIFICANCE OF ISOLATING THIS ORGANISM FROM A SINGLE SET OF BLOOD CULTURES WHEN MULTIPLE SETS ARE DRAWN IS UNCERTAIN. PLEASE NOTIFY THE MICROBIOLOGY DEPARTMENT WITHIN ONE WEEK IF SPECIATION AND SENSITIVITIES ARE REQUIRED. Performed at [facility], [address], [place], [place] [address]    Report Status PENDING  Incomplete  Blood Culture ID Panel (Reflexed)     Status: Abnormal   Collection Time: 03/27/24 12:10 PM  Result Value Ref Range Status   Enterococcus faecalis NOT DETECTED NOT DETECTED Final   Enterococcus Faecium NOT DETECTED NOT DETECTED Final   Listeria monocytogenes NOT DETECTED NOT DETECTED Final   Staphylococcus species DETECTED (A) NOT DETECTED Final    Comment: CRITICAL RESULT CALLED TO, READ BACK BY AND VERIFIED WITH: TREY GREENWOOD 03/28/24 0928 MW    Staphylococcus aureus (BCID) NOT DETECTED NOT DETECTED Final   Staphylococcus epidermidis DETECTED (A) NOT DETECTED Final  Comment: Methicillin (oxacillin) resistant coagulase negative staphylococcus. Possible blood culture contaminant (unless isolated from  more than one blood culture draw or clinical case suggests pathogenicity). No antibiotic treatment is indicated for blood  culture contaminants. CRITICAL RESULT CALLED TO, READ BACK BY AND VERIFIED WITH: TREY GREENWOOD 03/28/24 0928 MW    Staphylococcus lugdunensis NOT DETECTED NOT DETECTED Final   Streptococcus species NOT DETECTED NOT DETECTED Final   Streptococcus agalactiae NOT DETECTED NOT DETECTED Final   Streptococcus pneumoniae NOT DETECTED NOT DETECTED Final   Streptococcus pyogenes NOT DETECTED NOT DETECTED Final   A.calcoaceticus-baumannii NOT DETECTED NOT DETECTED Final   Bacteroides fragilis NOT DETECTED NOT DETECTED Final   Enterobacterales NOT DETECTED NOT DETECTED Final   Enterobacter cloacae complex NOT DETECTED NOT DETECTED Final   Escherichia coli NOT DETECTED NOT DETECTED Final   Klebsiella aerogenes NOT DETECTED NOT DETECTED Final   Klebsiella oxytoca NOT DETECTED NOT DETECTED Final   Klebsiella pneumoniae NOT DETECTED NOT DETECTED Final   Proteus species NOT DETECTED NOT DETECTED Final   Salmonella species NOT DETECTED NOT DETECTED Final   Serratia marcescens NOT DETECTED NOT DETECTED Final   Haemophilus influenzae NOT DETECTED NOT DETECTED Final   Neisseria meningitidis NOT DETECTED NOT DETECTED Final   Pseudomonas aeruginosa NOT DETECTED NOT DETECTED Final   Stenotrophomonas maltophilia NOT DETECTED NOT DETECTED Final   Candida albicans NOT DETECTED NOT DETECTED Final   Candida auris NOT DETECTED NOT DETECTED Final   Candida glabrata NOT DETECTED NOT DETECTED Final   Candida krusei NOT DETECTED NOT DETECTED Final   Candida parapsilosis NOT DETECTED NOT DETECTED Final   Candida tropicalis NOT DETECTED NOT DETECTED Final   Cryptococcus neoformans/gattii NOT DETECTED NOT DETECTED Final   Methicillin resistance mecA/C DETECTED (A) NOT DETECTED Final    Comment: CRITICAL RESULT CALLED TO, READ BACK BY AND VERIFIED WITH: MOSE BLEW 03/28/24 9071 MW Performed at  [facility], [address] [place], [place] [address]   C Difficile Quick Screen (NO PCR Reflex)     Status: None   Collection Time: 03/28/24  9:15 AM   Specimen: STOOL  Result Value Ref Range Status   C Diff antigen NEGATIVE NEGATIVE Final   C Diff toxin NEGATIVE NEGATIVE Final   C Diff interpretation No C. difficile detected.  Final    Comment: Performed at [facility], [address] [place], [place] [address]  Culture, blood (Routine X 2) w Reflex to ID Panel     Status: None (Preliminary result)   Collection Time: 03/28/24 10:46 AM   Specimen: BLOOD RIGHT HAND  Result Value Ref Range Status   Specimen Description BLOOD RIGHT HAND  Final   Special Requests   Final    BOTTLES DRAWN AEROBIC AND ANAEROBIC Blood Culture adequate volume   Culture   Final    NO GROWTH < 24 HOURS Performed at [facility], [address], [place], [place] [address]    Report Status PENDING  Incomplete  Culture, blood (Routine X 2) w Reflex to ID Panel     Status: None (Preliminary result)   Collection Time: 03/28/24 10:46 AM   Specimen: BLOOD RIGHT ARM  Result Value Ref Range Status   Specimen Description BLOOD RIGHT ARM  Final   Special Requests   Final    BOTTLES DRAWN AEROBIC AND ANAEROBIC Blood Culture adequate volume   Culture   Final    NO GROWTH < 24 HOURS Performed at [facility], [address] [place], [place] [address]    Report Status PENDING  Incomplete     Studies/Results: DG Chest 1 View Result Date: 03/29/2024 EXAM: 1 VIEW(S) XRAY OF THE CHEST 03/29/2024 01:37:12 AM COMPARISON: 03/27/2024 CLINICAL HISTORY: Dyspnea FINDINGS: LINES, TUBES AND DEVICES: Enteric tube in place, coursing through chest to abdomen beyond field-of-view. LUNGS AND PLEURA: Low lung volumes with bronchovascular crowding. No pleural effusion. No pneumothorax. No focal consolidation. HEART AND MEDIASTINUM: Aortic atherosclerosis. BONES AND SOFT TISSUES: No acute  osseous abnormality. IMPRESSION: 1. Pulmonary hypoinflation. Electronically signed by: Dorethia Molt MD 03/29/2024 01:43 AM EST RP Workstation: [id number]   DG Naso G Tube Plc W/Fl W/Rad Result Date: 03/27/2024 INDICATION: Protein calorie malnutrition s/p Dobbhoff placement on 03/20/2024 that was unintentionally removed earlier today. EXAM: NASO G TUBE PLACEMENT WITH FL AND WITH RAD FLUOROSCOPY TIME:  Radiation Exposure Index (as provided by the fluoroscopic device): 8.3 mGy Kerma COMPLICATIONS: None immediate PROCEDURE: The Dobbhoff tube was lubricated with viscous lidocaine inserted into the right nostril. Under intermittent fluoroscopic guidance, the Dobbhoff tube was advanced through the stomach, through the duodenum with tip ultimately terminating in the descending duodenum. A spot fluoroscopic image was saved for documentation purposes. The tube was affixed to the patient's nose with tape. The patient tolerated the procedure well without immediate postprocedural complication. IMPRESSION: Successful fluoroscopic guided placement of Dobbhoff tube with tip terminating in the descending duodenum. The tube is ready for immediate use. This exam was performed by [facility] PA-C, and was supervised and interpreted by Dr. VEAR Lent. Electronically Signed   By: Wilkie Lent M.D.   On: 03/27/2024 16:27    Progressed b/l ACA territory infarcts with cytotoxic edema left > rt   Assessment/Plan: 78 yr female in the hospital since 11/21 Extensive CVA   CVA- b/l ACA involvement unresponsive Multiple  infarcts in other areas Cytotoxic edema   Fever- could be from aspiration pneumonitis' Central fever possible R/o medication induced fever cdiff neg  No vegetations on the valves on 2 d echo 11/22 buttock wound-clean not infected    UA not done since 1//21 - check  PVR to look for any residue Pt was on cefepime and metronidazole  but 12/1 changed to  meropenem  Staph epi bactremia 1/2 bcx.  FU 12/2 ngtd - started vacnoc 12/2- if fu bcx neg can dc    Adjust antibiotics as things evolve   Hypernatremia- resolved Pt on NG feeds   Hyperglycemia     Aortic stenosis H/o back pain- may need imaging if fever persist   Dsicussed the management with family and her nurse and hospitalist  Dr.Bonni Neuser will take over her care from tomorrow

## 2024-03-29 NOTE — Progress Notes (Signed)
 Patient clinically stable post IR Gtube placement per Dr Hughes rear well. Fentanyl 25 mcg IV given for procedure, secondarily to resp;/neuro status. Appeared comfortable with insertion and Fentanyl. Report given to Jon Cleveland RN,at bedside post procedure/recovery/ with full update given to patients daughter as well.

## 2024-03-29 NOTE — Progress Notes (Addendum)
 Nutrition Follow-up  DOCUMENTATION CODES:   Obesity unspecified  INTERVENTION:   -Once PEG is placed and able to resume feeds:     Osmolite 1.5 @ 50 ml/hr    60 ml Prosource TF20 daily   200 ml free water  flush every 6 hours per MD   Tube feeding regimen provides 1880 kcal (100% of needs), 95 grams of protein, and 914 ml of H2O. Total free water : 1714 ml daily  -Continue MVI with minerals daily via tube -Continue 100 mg thiamine  daily x 7 days via tube -Continue 500 mg vitamin C BID via tube -Continue 220 mg zinc sulfate daily x 14 days via tube -Continue 60 ml Banatrol BID -Monitor Mg, K, and Phos and replete as needed secondary to high refeeding risk  -1 packet Juven BID via tube, each packet provides 95 calories, 2.5 grams of protein (collagen), and 9.8 grams of carbohydrate (3 grams sugar); also contains 7 grams of L-arginine and L-glutamine, 300 mg vitamin C, 15 mg vitamin E, 1.2 mcg vitamin B-12, 9.5 mg zinc, 200 mg calcium , and 1.5 g  Calcium  Beta-hydroxy-Beta-methylbutyrate to support wound healing   NUTRITION DIAGNOSIS:   Inadequate oral intake related to inability to eat, dysphagia as evidenced by NPO status.  Ongoing  GOAL:   Patient will meet greater than or equal to 90% of their needs  Progressing   MONITOR:   Diet advancement, TF tolerance  REASON FOR ASSESSMENT:   Consult Wound healing  ASSESSMENT:   78 y.o. female with medical history significant for Hypertension, arthritis, chronic back pain, ambulance at baseline with a walker being admitted for an acute CVA.  11/22- NPO with ice chips 11/23- bedside NGT placement attempted by RN, unsuccessful; per SLP unable to assess due to lethargy 11/24- NGT placed via fluoroscopy (tip of tube in lumen per PA note); TF initiated 11/24- s/p BSE- NPO 11/25- rapid response called secondary to lethargy and tachypenia, SLP signed off 11/26- s/p EEG- consistent with a generalized nonspecific cerebral dysfunction,  though triphasic waves are nonspecific and they can be associated with toxic/metabolic etiologies of encephalopathy; no seizure activity noted 11/30- rectal tube placed secondary to diarrhea 12/1- NGT pulled out, new dobhoff tube placed via fluoroscopy  12/3- PEG placed  Reviewed I/O's: -320 ml x 24 hours and +14.8 L since admission  UOP: 1.2 L x 24 hours  Rectal tube output: 150 ml x 24 hours   Per CWOCN notes, patient with Deep Tissue Pressure Injury now with non viable tissue on the left buttock; partial thickness skin loss over the remaining affected areas.    Patient NPO and TF being held secondary to PEG placement today.   Reviewed weight history. Weights have ranged from 83.5 kg- 87.4 kg since admission.   ADDENDUM (1538): Per radiology notes, TF can begin at 1714. Order placed; plan discussed with team.   Per palliative care notes, plan for PEG placement and SNF at discharge ([facility]). Per TOC notes, medicaid application has been submitted.   Medications reviewed and include vitamin C, santyl, vitamin B-12, MVI, and zinc sulfate.   Labs reviewed: CBGS: 109-214 (inpatient orders for glycemic control are 0-15 units insulin aspart every 4 hours). Case discussed with DM coordinator, who recommends TF coverage.   Diet Order:   Diet Order             Diet NPO time specified  Diet effective midnight  EDUCATION NEEDS:   Education needs have been addressed  Skin:  Skin Assessment: Skin Integrity Issues: Skin Integrity Issues:: DTI DTI: sacrum and bilateral buttocks; dark discoloration evolving with red moist areas to buttocks and linear red moist area coccyx  Last BM:  03/28/24 (50 ml via rectal tube)  Height:   Ht Readings from Last 1 Encounters:  03/17/24 5' 5 (1.651 m)    Weight:   Wt Readings from Last 1 Encounters:  03/26/24 85.8 kg    Ideal Body Weight:  56.8 kg  BMI:  Body mass index is 31.48 kg/m.  Estimated Nutritional  Needs:   Kcal:  1800-2000  Protein:  95-110 grams  Fluid:  1.8-2.0 L    Margery ORN, RD, LDN, CDCES Registered Dietitian III Certified Diabetes Care and Education Specialist If unable to reach this RD, please use RD Inpatient group chat on secure chat between hours of 8am-4 pm daily

## 2024-03-30 ENCOUNTER — Inpatient Hospital Stay

## 2024-03-30 DIAGNOSIS — B957 Other staphylococcus as the cause of diseases classified elsewhere: Secondary | ICD-10-CM

## 2024-03-30 DIAGNOSIS — Z7189 Other specified counseling: Secondary | ICD-10-CM | POA: Diagnosis not present

## 2024-03-30 DIAGNOSIS — R7881 Bacteremia: Secondary | ICD-10-CM

## 2024-03-30 DIAGNOSIS — I639 Cerebral infarction, unspecified: Secondary | ICD-10-CM | POA: Diagnosis not present

## 2024-03-30 LAB — CBC
HCT: 39.8 % (ref 36.0–46.0)
Hemoglobin: 12.6 g/dL (ref 12.0–15.0)
MCH: 28.7 pg (ref 26.0–34.0)
MCHC: 31.7 g/dL (ref 30.0–36.0)
MCV: 90.7 fL (ref 80.0–100.0)
Platelets: 322 K/uL (ref 150–400)
RBC: 4.39 MIL/uL (ref 3.87–5.11)
RDW: 13.9 % (ref 11.5–15.5)
WBC: 18.1 K/uL — ABNORMAL HIGH (ref 4.0–10.5)
nRBC: 0 % (ref 0.0–0.2)

## 2024-03-30 LAB — URINALYSIS, COMPLETE (UACMP) WITH MICROSCOPIC
Bilirubin Urine: NEGATIVE
Glucose, UA: NEGATIVE mg/dL
Hgb urine dipstick: NEGATIVE
Ketones, ur: NEGATIVE mg/dL
Leukocytes,Ua: NEGATIVE
Nitrite: NEGATIVE
Protein, ur: NEGATIVE mg/dL
Specific Gravity, Urine: 1.019 (ref 1.005–1.030)
pH: 5 (ref 5.0–8.0)

## 2024-03-30 LAB — RENAL FUNCTION PANEL
Albumin: 3.3 g/dL — ABNORMAL LOW (ref 3.5–5.0)
Anion gap: 8 (ref 5–15)
BUN: 29 mg/dL — ABNORMAL HIGH (ref 8–23)
CO2: 31 mmol/L (ref 22–32)
Calcium: 9.1 mg/dL (ref 8.9–10.3)
Chloride: 109 mmol/L (ref 98–111)
Creatinine, Ser: 0.74 mg/dL (ref 0.44–1.00)
GFR, Estimated: >60 mL/min (ref >60)
Glucose, Bld: 164 mg/dL — ABNORMAL HIGH (ref 70–99)
Phosphorus: 3.3 mg/dL (ref 2.5–4.6)
Potassium: 4.8 mmol/L (ref 3.5–5.1)
Sodium: 148 mmol/L — ABNORMAL HIGH (ref 135–145)

## 2024-03-30 LAB — BASIC METABOLIC PANEL WITH GFR
Anion gap: 8 (ref 5–15)
BUN: 29 mg/dL — ABNORMAL HIGH (ref 8–23)
CO2: 31 mmol/L (ref 22–32)
Calcium: 9.1 mg/dL (ref 8.9–10.3)
Chloride: 110 mmol/L (ref 98–111)
Creatinine, Ser: 0.74 mg/dL (ref 0.44–1.00)
GFR, Estimated: >60 mL/min (ref >60)
Glucose, Bld: 162 mg/dL — ABNORMAL HIGH (ref 70–99)
Potassium: 4.9 mmol/L (ref 3.5–5.1)
Sodium: 148 mmol/L — ABNORMAL HIGH (ref 135–145)

## 2024-03-30 LAB — GLUCOSE, CAPILLARY
Glucose-Capillary: 115 mg/dL — ABNORMAL HIGH (ref 70–99)
Glucose-Capillary: 143 mg/dL — ABNORMAL HIGH (ref 70–99)
Glucose-Capillary: 190 mg/dL — ABNORMAL HIGH (ref 70–99)
Glucose-Capillary: 191 mg/dL — ABNORMAL HIGH (ref 70–99)
Glucose-Capillary: 199 mg/dL — ABNORMAL HIGH (ref 70–99)

## 2024-03-30 LAB — CULTURE, BLOOD (ROUTINE X 2): Special Requests: ADEQUATE

## 2024-03-30 MED ORDER — FREE WATER
250.0000 mL | Status: DC
  Administered 2024-03-30 – 2024-03-31 (×6): 250 mL

## 2024-03-30 MED ORDER — PROPRANOLOL HCL 10 MG PO TABS
10.0000 mg | ORAL_TABLET | Freq: Three times a day (TID) | ORAL | Status: DC
  Administered 2024-03-30 – 2024-04-02 (×11): 10 mg
  Filled 2024-03-30 (×12): qty 1

## 2024-03-30 MED ORDER — CARVEDILOL 3.125 MG PO TABS
3.1250 mg | ORAL_TABLET | Freq: Two times a day (BID) | ORAL | Status: AC
  Administered 2024-03-30 – 2024-03-31 (×2): 3.125 mg via NASOGASTRIC
  Filled 2024-03-30 (×2): qty 1

## 2024-03-30 MED ORDER — ENOXAPARIN SODIUM 40 MG/0.4ML IJ SOSY
40.0000 mg | PREFILLED_SYRINGE | INTRAMUSCULAR | Status: DC
  Administered 2024-03-30 – 2024-04-04 (×6): 40 mg via SUBCUTANEOUS
  Filled 2024-03-30 (×6): qty 0.4

## 2024-03-30 NOTE — Progress Notes (Signed)
 Pt's MEWS improved from 9 to 7. MD aware of current situation.

## 2024-03-30 NOTE — Progress Notes (Signed)
 Patient felt warm to touch checked axillary temp 101.7, given PRN tylenol  via gtube, NG tube removed without complications, patient repositioned by staff for comfort. Family at bedside  03/30/24 1000  Vitals  Temp (!) 101.7 F (38.7 C)  Temp Source Axillary  Level of Consciousness  Level of Consciousness Responds to Pain  MEWS COLOR  MEWS Score Color Red  MEWS Score  MEWS Temp 2  MEWS Systolic 0  MEWS Pulse 2  MEWS RR 3  MEWS LOC 2  MEWS Score 9  Provider Notification  Provider Name/Title Daria Trellis COME  Date Provider Notified 03/30/24  Time Provider Notified 1012  Method of Notification Page  Notification Reason Other (Comment)  Provider response Other (Comment)

## 2024-03-30 NOTE — Progress Notes (Addendum)
 Nutrition Follow-up  DOCUMENTATION CODES:   Obesity unspecified  INTERVENTION:   TF via PEG:   Osmolite 1.5 @ 50 ml/hr    60 ml Prosource TF20 daily   250 ml free water  flush every 4 hours per MD   Tube feeding regimen provides 1880 kcal (100% of needs), 95 grams of protein, and 914 ml of H2O. Total free water : 2414 ml daily   -Continue MVI with minerals daily via tube -Continue 500 mg vitamin C  BID via tube -Continue 220 mg zinc  sulfate daily x 14 days via tube -Continue 60 ml Banatrol BID -Monitor Mg, K, and Phos and replete as needed secondary to high refeeding risk  -Continue 1 packet Juven BID via tube, each packet provides 95 calories, 2.5 grams of protein (collagen), and 9.8 grams of carbohydrate (3 grams sugar); also contains 7 grams of L-arginine and L-glutamine, 300 mg vitamin C , 15 mg vitamin E, 1.2 mcg vitamin B-12, 9.5 mg zinc , 200 mg calcium , and 1.5 g  Calcium  Beta-hydroxy-Beta-methylbutyrate to support wound healing   NUTRITION DIAGNOSIS:   Inadequate oral intake related to inability to eat, dysphagia as evidenced by NPO status.  Ongoing  GOAL:   Patient will meet greater than or equal to 90% of their needs  Progressing   MONITOR:   Diet advancement, TF tolerance  REASON FOR ASSESSMENT:   Consult Wound healing  ASSESSMENT:   78 y.o. female with medical history significant for Hypertension, arthritis, chronic back pain, ambulance at baseline with a walker being admitted for an acute CVA.  11/22- NPO with ice chips 11/23- bedside NGT placement attempted by RN, unsuccessful; per SLP unable to assess due to lethargy 11/24- NGT placed via fluoroscopy (tip of tube in lumen per PA note); TF initiated 11/24- s/p BSE- NPO 11/25- rapid response called secondary to lethargy and tachypenia, SLP signed off 11/26- s/p EEG- consistent with a generalized nonspecific cerebral dysfunction, though triphasic waves are nonspecific and they can be associated with  toxic/metabolic etiologies of encephalopathy; no seizure activity noted 11/30- rectal tube placed secondary to diarrhea 12/1- NGT pulled out, new dobhoff tube placed via fluoroscopy  12/3- PEG placed, TF resumed   Reviewed I/O's: +1.5 L x 24 hours and +16.3 L since admission  UOP: 1.1 L x 24 hours  Rectal tube output: 400 ml x 24 hours  Per CWOCN notes, patient with Deep Tissue Pressure Injury now with non viable tissue on the left buttock; partial thickness skin loss over the remaining affected areas.   Patient sleeping soundly at time of visit. Osmolite 1.5 infusing via PEG at 40 ml/hr. Patient NPO secondary to dysphagia as a result of CVA and receiving sole source nutrition via PEG; anticipate long term need for enteral nutrition support/ PEG.   Case discussed with MD; free water  flushes adjusted due to hypernatremia. Discussed TF regimen for discharge.   Reviewed weight history. Patient weight has ranged from 85.2 kg-87.4 kg over the past 7 days.  Patient remains with rectal tube. Patient on antibiotics, which may be contributing to diarrhea. Fiber supplement continues.   Per TOC notes, plan for SNF placement at discharge.   Medications reviewed and include vitamin C , plavix , lovenox , MVI, and zinc  sulfate.   Labs reviewed: Na: 148, K, Mg, and Phos WDL. CBGS: 109-190 (inpatient orders for glycemic control are 0-15 units insulin  aspart every 4 hours).    Diet Order:   Diet Order             Diet NPO  time specified  Diet effective midnight                   EDUCATION NEEDS:   Education needs have been addressed  Skin:  Skin Assessment: Skin Integrity Issues: Skin Integrity Issues:: DTI DTI: sacrum and bilateral buttocks; dark discoloration evolving with red moist areas to buttocks and linear red moist area coccyx  Last BM:  03/29/24 (400 ml via rectal tube)  Height:   Ht Readings from Last 1 Encounters:  03/29/24 5' 5 (1.651 m)    Weight:   Wt Readings from  Last 1 Encounters:  03/30/24 85.2 kg    Ideal Body Weight:  56.8 kg  BMI:  Body mass index is 31.26 kg/m.  Estimated Nutritional Needs:   Kcal:  1800-2000  Protein:  95-110 grams  Fluid:  1.8-2.0 L    Margery ORN, RD, LDN, CDCES Registered Dietitian III Certified Diabetes Care and Education Specialist If unable to reach this RD, please use RD Inpatient group chat on secure chat between hours of 8am-4 pm daily

## 2024-03-30 NOTE — Progress Notes (Signed)
 NEUROLOGY CONSULT FOLLOW UP NOTE   Date of service: March 30, 2024 Patient Name: Marie Pham MRN:  [id number] DOB:  June 22, 1945  Interval Hx/subjective   Continues to be encephalopathic Underwent PEG placement yesterday Continue to be hyperthermic without infection source  Vitals   Vitals:   03/30/24 0818 03/30/24 1000 03/30/24 1208 03/30/24 1225  BP:      Pulse: (!) 119     Resp: (!) 38     Temp: 99.1 F (37.3 C) (!) 101.7 F (38.7 C) (!) 102.2 F (39 C) (!) 102.9 F (39.4 C)  TempSrc: Axillary Axillary Axillary Rectal  SpO2: 97%     Weight:      Height:         Body mass index is 31.26 kg/m.  Physical Exam   General: In bed, poorly responsive  Neurologic Examination    MS: Opens eyes partially to noxious stimulation, does not follow commands, look at examiner, or otherwise interact meaningfully, occasionally groans. CN: Pupils are reactive, VOR intact, has roving movements, blinks to eyelid stimulation bilaterally Motor: Flexion to noxious stimulation bilaterally in UE, no movement in LE Sensory: As above  Medications  Current Facility-Administered Medications:    acetaminophen  (TYLENOL ) tablet 650 mg, 650 mg, Per Tube, Q4H PRN **OR** acetaminophen  (TYLENOL ) 160 MG/5ML solution 650 mg, 650 mg, Per Tube, Q4H PRN, 650 mg at 03/30/24 1002 **OR** acetaminophen  (TYLENOL ) suppository 650 mg, 650 mg, Rectal, Q4H PRN, Lenon Elsie HERO, [facility]   ascorbic acid  (VITAMIN C ) tablet 500 mg, 500 mg, Per Tube, BID, Amin, Sumayya, MD, 500 mg at 03/30/24 9063   aspirin  chewable tablet 81 mg, 81 mg, Per NG tube, Daily, Jens Durand, MD, 81 mg at 03/30/24 0936   atorvastatin  (LIPITOR ) tablet 80 mg, 80 mg, Per NG tube, Daily, Ayiku, Bernard, MD, 80 mg at 03/30/24 0936   carvedilol  (COREG ) tablet 12.5 mg, 12.5 mg, Per NG tube, BID WC, Jens Durand, MD, 12.5 mg at 03/30/24 0936   clopidogrel  (PLAVIX ) tablet 75 mg, 75 mg, Per Tube, Daily, Al-Sultani, Anmar, MD, 75 mg at 03/30/24  0936   collagenase  (SANTYL ) ointment, , Topical, Daily, Al-Sultani, Anmar, MD, Given at 03/30/24 1235   enoxaparin  (LOVENOX ) injection 40 mg, 40 mg, Subcutaneous, Q24H, Al-Sultani, Anmar, MD, 40 mg at 03/30/24 1224   feeding supplement (OSMOLITE 1.5 CAL) liquid 1,000 mL, 1,000 mL, Per Tube, Continuous, Al-Sultani, Anmar, MD, Last Rate: 40 mL/hr at 03/30/24 0528, Rate Change at 03/30/24 0528   feeding supplement (PROSource TF20) liquid 60 mL, 60 mL, Per Tube, Daily, Al-Sultani, Anmar, MD, 60 mL at 03/30/24 0928   fiber supplement (BANATROL TF) liquid 60 mL, 60 mL, Per Tube, BID, Al-Sultani, Anmar, MD, 60 mL at 03/30/24 0928   free water  250 mL, 250 mL, Per Tube, Q4H, Al-Sultani, Anmar, MD   ibuprofen  (ADVIL ) 100 MG/5ML suspension 400 mg, 400 mg, Per Tube, Q8H PRN, Amin, Sumayya, MD, 400 mg at 03/30/24 1225   insulin  aspart (novoLOG ) injection 0-15 Units, 0-15 Units, Subcutaneous, Q4H, Amin, Sumayya, MD, 3 Units at 03/30/24 1225   ipratropium-albuterol  (DUONEB) 0.5-2.5 (3) MG/3ML nebulizer solution 3 mL, 3 mL, Nebulization, Q4H PRN, Mansy, Jan A, MD, 3 mL at 03/29/24 0125   latanoprost  (XALATAN ) 0.005 % ophthalmic solution 1 drop, 1 drop, Both Eyes, QHS, Amin, Sumayya, MD, 1 drop at 03/29/24 2156   lip balm (CARMEX) ointment 1 Application, 1 Application, Topical, PRN, Amin, Sumayya, MD, 1 Application at 03/27/24 2156   losartan  (COZAAR ) tablet 25 mg,  25 mg, Per Tube, Daily, Amin, Sumayya, MD, 25 mg at 03/30/24 0936   meropenem  (MERREM ) 1 g in sodium chloride  0.9 % 100 mL IVPB, 1 g, Intravenous, Q8H, Tobie Myron M, RPH, Last Rate: 200 mL/hr at 03/30/24 0540, 1 g at 03/30/24 0540   morphine  (PF) 2 MG/ML injection 1-2 mg, 1-2 mg, Intravenous, Q4H PRN, Mansy, Jan A, MD, 2 mg at 03/29/24 0125   multivitamin with minerals tablet 1 tablet, 1 tablet, Per Tube, Daily, Jens Durand, MD, 1 tablet at 03/30/24 9063   nutrition supplement (JUVEN) (JUVEN) powder packet 1 packet, 1 packet, Per Tube, BID BM,  Al-Sultani, Anmar, MD, 1 packet at 03/30/24 0928   Oral care mouth rinse, 15 mL, Mouth Rinse, 4 times per day, Caleen Qualia, MD, 15 mL at 03/30/24 1227   Oral care mouth rinse, 15 mL, Mouth Rinse, PRN, Amin, Sumayya, MD   vancomycin  (VANCOREADY) IVPB 1500 mg/300 mL, 1,500 mg, Intravenous, Daily, Zeigler, Dustin G, RPH, Last Rate: 150 mL/hr at 03/30/24 0926, 1,500 mg at 03/30/24 9073   zinc  sulfate (50mg  elemental zinc ) capsule 220 mg, 220 mg, Per Tube, Daily, Amin, Sumayya, MD, 220 mg at 03/30/24 0936  Labs and Diagnostic Imaging   LDL 168 A1c 6.1  EEG - encephalopathic  Imaging(Personally reviewed): MRI - much more extensive bilateral frontal infarcts in the same distribution CTA-both the ACAs are fed by the left ACOM with minimal contribution (I do not see any right ACOM) from the right side  Assessment   Marie Pham is a 78 y.o. female with bilateral ACA territory infarcts, the left was much more involved than the right.  There is an irregularity right where the ACAs come off of the ACOM, and I am wondering if this finally occluded.  Bifrontal strokes can often result in severely diminished motivation and can result in akinetic mutism, and she does have significant bifrontal strokes. The strips listed as afib were reviewed and are not consistent with afib.   There is a chance that she could have some gradual improvement over time, though this is not guaranteed. 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 Palliative has been engaging in extensive conversations with family about GOC. She is now partial DNR with intubation allowed. She will receive palliative care at hospital discharge but is not on hospice.  Neurology was reconsulted today 2/2 concern for central fever and storming given persistent hyperthermia without e/o infection.  Recommendations  - head CT r/o increased ICP - Start with propanolol 10mg  TID dose for neurogenic  fever.  - Recommend decrease carvedilol  to 3.125mg  for 2 more doses then stop. Monitor BP and HR tomorrow. - Scheduled tylenol  - Cooling blankets - Restart plavix  after GI clears - F/u ID recs - Appreciate GOC discussions and palliative involvement.    If fever and e/o sympathetic hyperactivity / storming not improved after 3 days please reconsult and we will consider increasing propranolol  to 20mg  tid or adding baclofen at that time. Will f/u tomorrow. ______________________________________________________________________  Elida Ross, MD [facility] [phone]  If 7pm- 7am, please page neurology on call as listed in [facility]ION.

## 2024-03-30 NOTE — Progress Notes (Signed)
   03/30/24 1000  Assess: MEWS Score  Temp (!) 101.7 F (38.7 C)  Level of Consciousness Responds to Pain  Assess: MEWS Score  MEWS Temp 2  MEWS Systolic 0  MEWS Pulse 2  MEWS RR 3  MEWS LOC 2  MEWS Score 9  MEWS Score Color Red  Provider Notification  Provider Name/Title Daria Trellis COME  Date Provider Notified 03/30/24  Time Provider Notified 1012  Method of Notification Page  Notification Reason Other (Comment)  Provider response Other (Comment)  Assess: SIRS CRITERIA  SIRS Temperature  1  SIRS Respirations  1  SIRS Pulse 1  SIRS WBC 1  SIRS Score Sum  4

## 2024-03-30 NOTE — Plan of Care (Signed)

## 2024-03-30 NOTE — Progress Notes (Signed)
 Messaged MD via epic chat re: new order for DVT US  bilateral. MD agree's that pt should have US  done portable. Spoke with US  via telephone and they will attempt to come up.

## 2024-03-30 NOTE — Progress Notes (Incomplete)
-   head CT r/o increased ICP - Stop carvedilol , start propranolol *** - If not normothermic within 3 days add baclfen*** - Restart plavix  after GI clears

## 2024-03-30 NOTE — Progress Notes (Signed)
 PROGRESS NOTE    Marie Pham  FMW:[id number] DOB: October 22, 1945 DOA: 03/17/2024 PCP: Tobie Domino, MD    Brief Narrative:  Marie Pham is a 78 y.o. female with PMHx of HTN, chronic back pain, arthritis, OSA, depression, asthma/bronchitis, and baseline limited mobility (ambulates with walker) who presented with poor oral intake and new speech difficulty.  Family reported she appeared to understand but was unable to respond verbally.  Last well-known was >24 hours before arrival.  In ED, she was afebrile with a temp of 98.7 F, RR 18, HR 115, BP 120/99, SpO2 95% on RA, placed on 2 L O2 for comfort.  Initial labs showed mild polycythemia, hyponatremia (NA 147), Troponins were negative, mildly elevated D-dimer to 0.92.  EKG showed sinus tachycardia, prolonged QTc to 494 ms. CTA head/neck demonstrated an acute left basal ganglia infarct.  She received an NS bolus, Cardizem , and aspirin  suppository.  She was admitted for stroke management.  MRI (11/22) confirmed bilateral ACA territory infarcts with cytotoxic edema (L >R).  TTE (11/22) showed LVEF 60-65%, G3DD, no atrial level shunt detected by color-flow Doppler. She failed swallow eval and underwent dobhoff feeding tube placemenet by IR on 11/24. Shortly after admission she became progressively more somnolent and then minimally responsive.  EEG (11/26) showed generalized cortical dysfunction without seizures. Repeat MRI (11/27) showed further evolution of the bilateral ACA strokes without hemorrhagic transformation.  Neurology was consulted and advised that prognosis of meaningful neurologic recovery is extremely poor. She underwent PEG tube placement on 12/3.   Her course was complicated by recurrent fevers and persistent tachypnea.  There was concern for aspiration on 11/27 and she was started on Rocephin  and Flagyl.  Fevers recurred, prompting ID involvement.  CXR repeated and demonstrated low lung volumes and bronchovascular crowding without focal  consolidation.  1 of 2 blood cultures (12/1) was positive for MRSE (likely contaminant), and repeat cultures were sent (12/2) prior to antibiotic change which showed no growth.  Antibiotics were escalated to cefepime plus vancomycin, later changed to meropenem due to concern for cefepime worsening encephalopathy.  C. difficile PCR remained negative.  Given extensive bifrontal injury and cytotoxic edema, ID felt some fevers were likely central.   Neurologically, she has remained severely encephalopathic.  She intermittently opens her eyes to voice, and per daughter had occasional spontaneous eye opening and brief tracking, but no sustained purposeful responsiveness.  Prognosis remains extremely guarded.    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 - PEG placed 12/3, internal nutrition per PEG started per RD - Continue aspirin , Lipitor . Plavix  resumed today after being held for PEG placement yesterday.  - PT OT evaluated, recommended SNF - Palliative care following - Neurology reengaged - recommended CT head noncon to rule out increased ICP  Recurrent fevers  SIRS Possible aspiration pneumonitis +/- central fever / sympathetic hyperactivity - CTA chest (11/22) with bilateral pneumonitis likely aspiration related, no PE - Treated initially with Rocephin  and azithromycin , later Rocephin  and Flagyl after concerns for aspiration overnight on 11/27 - Had recurrent fevers > 101 F on 12/1 with leukocytosis, blood cultures were obtained - CXR without focal infectious source - ID was consulted - Blood  cultures (12/1)  1 of 2 positive for MRSE, likely contaminant.  Blood cultures repeated on 12/2 prior to starting vancomycin - Antibiotics escalated to cefepime plus vancomycin, later revised to meropenem per ID - C. Diff PCR negative - Blood cultures (12/2) NGTD - UA (12/3) negative  - Continues to be febrile with Tmax 102.9 F today, leukocytosis worsening to 18.1 - Neurology reengaged due to concern for central fever, recommended scheduled Tylenol , propranolol 10 mg TID, gradual taper off Coreg , cooling blankets. Recommended reconsult if no improvement in fevers in 3 days.  - ID recommended obtaining LE doppler to rule out DVT, possibly followed by CT c/a/p if negative - Continue Vancomycin and Cefepime  Dysphagia secondary to CVA - PEG tube placed on 12/3 - RD following for continuation of tube feeds  Hypernatremia 2/2 free water  deficit - Improving but fluctuating - Discussed with RD, free water  flushes adjusted - Continue to monitor Na levels  Severe aortic stenosis  grade 3 diastolic dysfunction - Stable hemodynamics - Will need outpatient follow-up with cardiologist   Hypertension - Controlled - Propranolol started per neurology for possible central fevers / sympathetic hyperactivity - Coreg  tapered over 2 doses   Chronic back pain/lumbar radiculitis - Supportive management - Currently patient is unresponsive and unable to indicate pain level   Hyperglycemia - Hgb A1c 6.1 (11/22) - Continue to monitor given tube feeds - SSI    Sacral pressure wound   - Air mattress in place - Change position every 2 hour - Wound care consulted and following  Goals of care - Family wishes to continue full scope of care and observe for any neurologic improvement over the coming months before reconsidering hospice - Prognosis remains extremely guarded  DVT prophylaxis: enoxaparin  (LOVENOX ) injection 40 mg Start: 03/30/24 1100 Place and maintain sequential compression device Start:  03/28/24 1531   Code Status:   Code Status: Do not attempt resuscitation (DNR) PRE-ARREST INTERVENTIONS DESIRED  Family Communication: Discussed patient's current plan of care and extremely guarded prognosis with patient's daughter at bedside  Disposition Plan: Discharge to SNF  PT - Follow Up Recommendations: Long-term institutional care without follow-up therapy OT - Follow Up Recommendations: Long-term institutional care without follow-up therapy  DME Needs: PT equipment: Other (comment) (ongoing assessment due to lack of progress)    Level of care: Progressive  Consultants:  Neurology, infectious disease  Procedures:  IR Dobhoff placement - 11/24 IR PEG tube placement - 12/3  Antimicrobials: Vancomycin and meropenem   Subjective: Patient evaluated at bedside in the presence of patient's daughter and family friend. Remains unresponsive. Did not open eyes to verbal or noxious stimuli today. However, Marie Pham reports that the patient spontaneously opened her eyes yesterday after the PEG tube placement and may have tracked a bit. Discussed ongoing fevers and plan to reengage neurology and ID.   Objective: Vitals:   03/30/24 0818 03/30/24 1000 03/30/24 1208 03/30/24 1225  BP:      Pulse: (!) 119     Resp: (!) 38     Temp: 99.1 F (37.3 C) (!) 101.7 F (38.7 C) (!) 102.2 F (39 C) (!) 102.9 F (39.4 C)  TempSrc: Axillary Axillary Axillary Rectal  SpO2: 97%     Weight:      Height:        Intake/Output Summary (Last 24 hours) at 03/30/2024 1444 Last data filed at 03/30/2024 1241 Gross per 24 hour  Intake 2964 ml  Output 850 ml  Net 2114 ml   Marie Pham  03/26/24 0456 03/29/24 1500 03/30/24 0500  Weight: 85.8 kg 85.8 kg 85.2 kg    Examination:  Physical Exam Constitutional:      Appearance: She is ill-appearing.     Interventions: Nasal cannula in place.  HENT:     Mouth/Throat:     Mouth: Mucous membranes are dry.  Eyes:     Comments: Pupils  equal, constricted, not reactive, no purposeful eye movement, does not track. Not opening eyes to voice or noxious stimuli today.  Cardiovascular:     Rate and Rhythm: Regular rhythm. Tachycardia present.     Heart sounds: Murmur heard.     Systolic murmur is present.  Pulmonary:     Effort: Pulmonary effort is normal. Tachypnea present. No respiratory distress.     Comments: Unable to auscultate posterior lung fields. Anteriorly, mild expiratory wheezing noted on the left, clear to auscultation on the right. No accessory muscle use appreciated.  Abdominal:     General: There is no distension.     Palpations: Abdomen is soft.     Tenderness: There is no abdominal tenderness. There is no guarding.     Comments: PEG tube in place, dressing c/d/i   Genitourinary:    Comments: Purewick in place. FMS in place.  Musculoskeletal:     Right lower leg: No edema.     Left lower leg: No edema.  Skin:    General: Skin is warm and dry.     Capillary Refill: Capillary refill takes less than 2 seconds.  Neurological:     Mental Status: She is unresponsive.     GCS: GCS eye subscore is 1. GCS verbal subscore is 1. GCS motor subscore is 1.     Data Reviewed: I have personally reviewed following labs and imaging studies  CBC: Recent Labs  Lab 03/27/24 1210 03/28/24 0513 03/29/24 0449 03/30/24 0425  WBC 13.2* 15.0* 17.6* 18.1*  NEUTROABS 8.7* 10.6* 13.5*  --   HGB 11.9* 12.2 12.3 12.6  HCT 37.6 38.5 38.5 39.8  MCV 89.7 90.6 90.4 90.7  PLT 254 265 296 322   Basic Metabolic Panel: Recent Labs  Lab 03/26/24 0519 03/27/24 1210 03/28/24 0513 03/29/24 0449 03/30/24 0425  NA 148* 146* 145 147* 148*  148*  K 4.4 4.5 4.8 4.5 4.9  4.8  CL 109 108 107 107 110  109  CO2 32 31 29 32 31  31  GLUCOSE 201* 231* 227* 111* 162*  164*  BUN 28* 26* 21 22 29*  29*  CREATININE 0.73 0.73 0.68 0.65 0.74  0.74  CALCIUM  8.9 8.7* 8.9 9.1 9.1  9.1  PHOS 3.2 4.0 3.8 4.3 3.3   GFR: Estimated  Creatinine Clearance: 65.5 mL/min (by C-G formula based on SCr of 0.74 mg/dL). Liver Function Tests: Recent Labs  Lab 03/26/24 0519 03/27/24 1210 03/28/24 0513 03/29/24 0449 03/30/24 0425  AST  --   --  39  --   --   ALT  --   --  33  --   --   ALKPHOS  --   --  81  --   --   BILITOT  --   --  0.2  --   --   PROT  --   --  6.6  --   --   ALBUMIN 3.1* 3.1* 3.2*  3.2* 3.2* 3.3*   No results for input(s): LIPASE, AMYLASE in the last 168 hours. No results for input(s): AMMONIA in the last 168 hours.  Coagulation Profile: No results for input(s): INR, PROTIME in the last 168 hours. Cardiac Enzymes: Recent Labs  Lab 03/29/24 0449  CKTOTAL 139   BNP (last 3 results) Recent Labs    03/29/24 0449  PROBNP 286.0   HbA1C: No results for input(s): HGBA1C in the last 72 hours. CBG: Recent Labs  Lab 03/29/24 1600 03/29/24 2113 03/30/24 0045 03/30/24 0816 03/30/24 1207  GLUCAP 167* 114* 115* 190* 199*   Lipid Profile: No results for input(s): CHOL, HDL, LDLCALC, TRIG, CHOLHDL, LDLDIRECT in the last 72 hours. Thyroid  Function Tests: No results for input(s): TSH, T4TOTAL, FREET4, T3FREE, THYROIDAB in the last 72 hours. Anemia Panel: No results for input(s): VITAMINB12, FOLATE, FERRITIN, TIBC, IRON, RETICCTPCT in the last 72 hours. Sepsis Labs: No results for input(s): PROCALCITON, LATICACIDVEN in the last 168 hours.  Recent Results (from the past 240 hours)  Culture, blood (Routine X 2) w Reflex to ID Panel     Status: None (Preliminary result)   Collection Time: 03/27/24 12:10 PM   Specimen: BLOOD  Result Value Ref Range Status   Specimen Description BLOOD BLOOD RIGHT ARM  Final   Special Requests   Final    BOTTLES DRAWN AEROBIC AND ANAEROBIC Blood Culture adequate volume   Culture   Final    NO GROWTH 2 DAYS Performed at [facility], [address], [place], [place] [address]    Report Status PENDING   Incomplete  Culture, blood (Routine X 2) w Reflex to ID Panel     Status: Abnormal   Collection Time: 03/27/24 12:10 PM   Specimen: BLOOD LEFT ARM  Result Value Ref Range Status   Specimen Description   Final    BLOOD LEFT ARM Performed at [facility], [address] [place], [place] [address]    Special Requests   Final    BOTTLES DRAWN AEROBIC AND ANAEROBIC Blood Culture adequate volume Performed at [facility], [address] [place], [place] [address]    Culture  Setup Time   Final    GRAM POSITIVE COCCI IN BOTH AEROBIC AND ANAEROBIC BOTTLES CRITICAL RESULT CALLED TO, READ BACK BY AND VERIFIED WITH: TREY GREENWOOD 03/28/24 0928 MW    Culture (A)  Final    STAPHYLOCOCCUS EPIDERMIDIS THE SIGNIFICANCE OF ISOLATING THIS ORGANISM FROM A SINGLE SET OF BLOOD CULTURES WHEN MULTIPLE SETS ARE DRAWN IS UNCERTAIN. PLEASE NOTIFY THE MICROBIOLOGY DEPARTMENT WITHIN ONE WEEK IF SPECIATION AND SENSITIVITIES ARE REQUIRED. Performed at [facility], [address], [place], [place] [address]    Report Status 03/30/2024 FINAL  Final  Blood Culture ID Panel (Reflexed)     Status: Abnormal   Collection Time: 03/27/24 12:10 PM  Result Value Ref Range Status   Enterococcus faecalis NOT DETECTED NOT DETECTED Final   Enterococcus Faecium NOT DETECTED NOT DETECTED Final   Listeria monocytogenes NOT DETECTED NOT DETECTED Final   Staphylococcus species DETECTED (A) NOT DETECTED Final    Comment: CRITICAL RESULT CALLED TO, READ BACK BY AND VERIFIED WITH: TREY GREENWOOD 03/28/24 0928 MW    Staphylococcus aureus (BCID) NOT DETECTED NOT DETECTED Final   Staphylococcus epidermidis DETECTED (A) NOT DETECTED Final    Comment: Methicillin (oxacillin) resistant coagulase negative staphylococcus. Possible blood culture contaminant (unless isolated from more than one blood culture draw or clinical case suggests pathogenicity). No antibiotic treatment is indicated for blood  culture  contaminants. CRITICAL RESULT CALLED TO, READ BACK BY AND VERIFIED WITH: TREY GREENWOOD 03/28/24 9071  MW    Staphylococcus lugdunensis NOT DETECTED NOT DETECTED Final   Streptococcus species NOT DETECTED NOT DETECTED Final   Streptococcus agalactiae NOT DETECTED NOT DETECTED Final   Streptococcus pneumoniae NOT DETECTED NOT DETECTED Final   Streptococcus pyogenes NOT DETECTED NOT DETECTED Final   A.calcoaceticus-baumannii NOT DETECTED NOT DETECTED Final   Bacteroides fragilis NOT DETECTED NOT DETECTED Final   Enterobacterales NOT DETECTED NOT DETECTED Final   Enterobacter cloacae complex NOT DETECTED NOT DETECTED Final   Escherichia coli NOT DETECTED NOT DETECTED Final   Klebsiella aerogenes NOT DETECTED NOT DETECTED Final   Klebsiella oxytoca NOT DETECTED NOT DETECTED Final   Klebsiella pneumoniae NOT DETECTED NOT DETECTED Final   Proteus species NOT DETECTED NOT DETECTED Final   Salmonella species NOT DETECTED NOT DETECTED Final   Serratia marcescens NOT DETECTED NOT DETECTED Final   Haemophilus influenzae NOT DETECTED NOT DETECTED Final   Neisseria meningitidis NOT DETECTED NOT DETECTED Final   Pseudomonas aeruginosa NOT DETECTED NOT DETECTED Final   Stenotrophomonas maltophilia NOT DETECTED NOT DETECTED Final   Candida albicans NOT DETECTED NOT DETECTED Final   Candida auris NOT DETECTED NOT DETECTED Final   Candida glabrata NOT DETECTED NOT DETECTED Final   Candida krusei NOT DETECTED NOT DETECTED Final   Candida parapsilosis NOT DETECTED NOT DETECTED Final   Candida tropicalis NOT DETECTED NOT DETECTED Final   Cryptococcus neoformans/gattii NOT DETECTED NOT DETECTED Final   Methicillin resistance mecA/C DETECTED (A) NOT DETECTED Final    Comment: CRITICAL RESULT CALLED TO, READ BACK BY AND VERIFIED WITH: MOSE BLEW 03/28/24 9071 MW Performed at [facility], [address] [place], [place] [address]   C Difficile Quick Screen (NO PCR Reflex)     Status: None    Collection Time: 03/28/24  9:15 AM   Specimen: STOOL  Result Value Ref Range Status   C Diff antigen NEGATIVE NEGATIVE Final   C Diff toxin NEGATIVE NEGATIVE Final   C Diff interpretation No C. difficile detected.  Final    Comment: Performed at [facility], [address] [place], [place] [address]  Culture, blood (Routine X 2) w Reflex to ID Panel     Status: None (Preliminary result)   Collection Time: 03/28/24 10:46 AM   Specimen: BLOOD RIGHT HAND  Result Value Ref Range Status   Specimen Description BLOOD RIGHT HAND  Final   Special Requests   Final    BOTTLES DRAWN AEROBIC AND ANAEROBIC Blood Culture adequate volume   Culture   Final    NO GROWTH < 24 HOURS Performed at [facility], [address], [place], [place] [address]    Report Status PENDING  Incomplete  Culture, blood (Routine X 2) w Reflex to ID Panel     Status: None (Preliminary result)   Collection Time: 03/28/24 10:46 AM   Specimen: BLOOD RIGHT ARM  Result Value Ref Range Status   Specimen Description BLOOD RIGHT ARM  Final   Special Requests   Final    BOTTLES DRAWN AEROBIC AND ANAEROBIC Blood Culture adequate volume   Culture   Final    NO GROWTH < 24 HOURS Performed at [facility], [address], [place], [place] [address]    Report Status PENDING  Incomplete     Radiology Studies: IR GASTROSTOMY TUBE MOD SED Result Date: 03/29/2024 INDICATION: Dysphagia.  Altered mental status EXAM: PERCUTANEOUS GASTROSTOMY TUBE PLACEMENT COMPARISON:  Chest XR, earlier same day.  CT AP, 03/17/2024. MEDICATIONS: The  patient was on scheduled IV antibiotics Glucagon 0.5 mg IV CONTRAST:  15 mL of Isovue  300 administered into the gastric lumen. ANESTHESIA/SEDATION: Local anesthetic and single agent sedation was employed during this procedure. A total of fentanyl 25 mcg was administered intravenously. The patient's level of consciousness and vital signs were monitored continuously by radiology  nursing throughout the procedure under my direct supervision. FLUOROSCOPY: Radiation Exposure Index and estimated peak skin dose (PSD); Reference air kerma (RAK), 7 mGy. COMPLICATIONS: None immediate. PROCEDURE: Informed written consent was obtained from the patient and/or patient's representative following explanation of the procedure, risks, benefits and alternatives. A time out was performed prior to the initiation of the procedure. Maximal barrier sterile technique utilized including caps, mask, sterile gowns, sterile gloves, large sterile drape, hand hygiene and sterile prep. The LEFT costal margin and barium opacified transverse colon were identified and avoided. Air was injected into the stomach for insufflation and visualization under fluoroscopy. Under sterile conditions and local anesthesia, 2 T tacks were utilized to pexy the anterior aspect of the stomach against the ventral abdominal wall. Contrast injection confirmed appropriate positioning of each of the T tacks. An incision was made between the T tacks and a 17 gauge trocar needle was utilized to access the stomach. Needle position was confirmed within the stomach with aspiration of air and injection of a small amount of contrast. A stiff guidewire was advanced into the gastric lumen and under intermittent fluoroscopic guidance, the access needle was exchanged for a telescoping peel-away sheath, ultimately allowing placement of a 18 Fr balloon retention gastrostomy tube. The retention balloon was insufflated with a mixture of dilute saline and contrast and pulled taut against the anterior wall of the stomach. The external disc was cinched. Contrast injection confirms positioning within the stomach. Several spot radiographic images were obtained in various obliquities for documentation. The patient tolerated procedure well without immediate post procedural complication. FINDINGS: After successful fluoroscopic guided placement, the gastrostomy tube is  appropriately positioned with internal retention balloon against the ventral aspect of the gastric lumen. IMPRESSION: Successful fluoroscopic insertion of a 18 Fr balloon retention gastrostomy tube. The gastrostomy may be used immediately for medication administration and in 4 hrs for the initiation of feeds. RECOMMENDATIONS: The patient will return to Vascular [facility]iology (VIR) for routine feeding tube evaluation and exchange in 6 months. Thom Hall, MD [facility] Electronically Signed   By: Thom Hall M.D.   On: 03/29/2024 16:05   DG Chest 1 View Result Date: 03/29/2024 EXAM: 1 VIEW(S) XRAY OF THE CHEST 03/29/2024 01:37:12 AM COMPARISON: 03/27/2024 CLINICAL HISTORY: Dyspnea FINDINGS: LINES, TUBES AND DEVICES: Enteric tube in place, coursing through chest to abdomen beyond field-of-view. LUNGS AND PLEURA: Low lung volumes with bronchovascular crowding. No pleural effusion. No pneumothorax. No focal consolidation. HEART AND MEDIASTINUM: Aortic atherosclerosis. BONES AND SOFT TISSUES: No acute osseous abnormality. IMPRESSION: 1. Pulmonary hypoinflation. Electronically signed by: Dorethia Molt MD 03/29/2024 01:43 AM EST RP Workstation: [id number]    Scheduled Meds:  vitamin C  500 mg Per Tube BID   aspirin   81 mg Per NG tube Daily   atorvastatin   80 mg Per NG tube Daily   carvedilol   3.125 mg Per NG tube BID WC   clopidogrel   75 mg Per Tube Daily   collagenase   Topical Daily   enoxaparin  (LOVENOX ) injection  40 mg Subcutaneous Q24H   feeding supplement (PROSource TF20)  60 mL Per Tube Daily   fiber supplement (  BANATROL TF)  60 mL Per Tube BID   free water   250 mL Per Tube Q4H   insulin aspart  0-15 Units Subcutaneous Q4H   latanoprost  1 drop Both Eyes QHS   losartan   25 mg Per Tube Daily   multivitamin with minerals  1 tablet Per Tube Daily   nutrition supplement (JUVEN)  1 packet Per Tube BID BM   mouth rinse  15 mL  Mouth Rinse 4 times per day   propranolol  10 mg Per Tube TID   zinc sulfate (50mg  elemental zinc)  220 mg Per Tube Daily   Continuous Infusions:  feeding supplement (OSMOLITE 1.5 CAL) 40 mL/hr at 03/30/24 0528   meropenem (MERREM) IV 1 g (03/30/24 0540)   vancomycin 1,500 mg (03/30/24 0926)     Unresulted Labs (From admission, onward)     Start     Ordered   03/30/24 0500  CBC  Daily,   R     Question:  Specimen collection method  Answer:  Lab=Lab collect   03/29/24 1926   03/30/24 0500  Basic metabolic panel with GFR  Daily,   R     Question:  Specimen collection method  Answer:  Lab=Lab collect   03/29/24 1926   03/25/24 0500  Creatinine, serum  (enoxaparin  (LOVENOX )    CrCl >/= 30 ml/min)  Weekly,   TIMED     Comments: while on enoxaparin  therapy    03/18/24 0059             LOS:  LOS: 12 days   Time Spent: 60 minutes  Rosea Dory Al-Sultani, MD [facility]  If 7PM-7AM, please contact night-coverage  03/30/2024, 2:44 PM

## 2024-03-30 NOTE — Plan of Care (Signed)
  Problem: Education: Goal: Knowledge of General Education information will improve Description: Including pain rating scale, medication(s)/side effects and non-pharmacologic comfort measures 03/30/2024 1749 by Dionisio Tinnie LABOR, RN Outcome: Progressing 03/30/2024 1021 by Dionisio Tinnie LABOR, RN Outcome: Progressing   Problem: Health Behavior/Discharge Planning: Goal: Ability to manage health-related needs will improve 03/30/2024 1749 by Dionisio Tinnie LABOR, RN Outcome: Progressing 03/30/2024 1021 by Dionisio Tinnie LABOR, RN Outcome: Progressing

## 2024-03-30 NOTE — Progress Notes (Signed)
 Daily Progress Note   Patient Name: Marie Pham       Date: 03/30/2024 DOB: [birth year]  Age: 78 y.o. MRN#: [id number] Attending Physician: Al-Sultani, Anmar, MD Primary Care Physician: Tobie Domino, MD Admit Date: 03/17/2024  Reason for Consultation/Follow-up: Establishing goals of care  Subjective: Notes and labs reviewed.  Spoke with attending at length regarding patient's status.  In to see patient.  She is currently resting in bed at this time with daughter at bedside.  Long and candid discussion regarding concern for prognosis, and preparation on the other side of this admission.  Discussed continued aggressive care versus comfort care.  Discussed hospice facility placement.  Daughter discusses the family's deep Christian faith, and understanding that prognosis may not be good, but they would like to continue to give her time to see how she will do.  Family would like outpatient palliative to follow.  Discussed relaying to palliative that this could turn quickly to a hospice patient.  Daughter called one of the staff members at the receiving facility and relayed this to them.  Questions from facility regarding antibiotics and other care.  This was all relayed to the attending and specialty teams.   Length of Stay: 12  Current Medications: Scheduled Meds:   vitamin C  500 mg Per Tube BID   aspirin   81 mg Per NG tube Daily   atorvastatin   80 mg Per NG tube Daily   carvedilol   12.5 mg Per NG tube BID WC   clopidogrel   75 mg Per Tube Daily   collagenase   Topical Daily   enoxaparin  (LOVENOX ) injection  40 mg Subcutaneous Q24H   feeding supplement (PROSource TF20)  60 mL Per Tube Daily   fiber supplement (BANATROL TF)  60 mL Per Tube BID   free water   250 mL Per Tube Q4H   insulin  aspart  0-15 Units Subcutaneous Q4H   latanoprost  1 drop Both Eyes QHS   losartan   25 mg Per Tube Daily   multivitamin with minerals  1 tablet Per Tube Daily   nutrition supplement (JUVEN)  1 packet Per Tube BID BM   mouth rinse  15 mL Mouth Rinse 4 times per day   zinc sulfate (50mg  elemental zinc)  220 mg Per Tube Daily    Continuous Infusions:  feeding supplement (OSMOLITE 1.5 CAL) 40 mL/hr at 03/30/24 0528   meropenem (MERREM) IV 1 g (03/30/24 0540)   vancomycin 1,500 mg (03/30/24 0926)    PRN Meds: acetaminophen  **OR** acetaminophen  (TYLENOL ) oral liquid 160 mg/5 mL **OR** acetaminophen , ibuprofen, ipratropium-albuterol , lip balm, morphine injection, mouth rinse  Physical Exam Constitutional:      Comments: Eyes closed  Pulmonary:     Comments: Tachypnea Skin:    General: Skin is warm and dry.             Vital Signs: BP (!) 148/48   Pulse (!) 119   Temp (!) 102.9 F (39.4 C) (Rectal)   Resp (!) 38   Ht 5' 5 (1.651 m)   Wt 85.2 kg   SpO2 97%   BMI 31.26 kg/m  SpO2: SpO2: 97 % O2 Device: O2 Device: Nasal Cannula O2 Flow Rate: O2 Flow Rate (L/min): 2 L/min  Intake/output summary:  Intake/Output Summary (Last 24 hours) at 03/30/2024 1354 Last data filed at 03/30/2024 1241 Gross per 24 hour  Intake 2964 ml  Output 850 ml  Net 2114 ml   LBM: Last BM Date : 03/29/24 Baseline Weight: Weight: 83.5 kg Most recent weight: Weight: 85.2 kg  Patient Active Problem List   Diagnosis Date Noted   Fever 03/28/2024   Severe aortic stenosis 03/19/2024   Pneumonitis 03/19/2024   AKI (acute kidney injury) 03/18/2024   Hypernatremia 03/18/2024   Abnormal EKG 03/18/2024   SIRS (systemic inflammatory response syndrome) (HCC) 03/18/2024   Acute CVA (cerebrovascular accident) (HCC) 03/17/2024   BP (high blood pressure) 10/02/2014   Asthma, chronic 10/02/2014   Essential hypertension 10/02/2014   Obesity 10/02/2014   Sleep apnea 10/02/2014    Palliative Care  Assessment & Plan    Recommendations/Plan: Family would still like discharge to [facility] with outpatient palliative.  Code Status:    Code Status Orders  (From admission, onward)           Start     Ordered   03/19/24 1605  Do not attempt resuscitation (DNR) Pre-Arrest Interventions Desired  (Code Status)  Continuous       Question Answer Comment  If pulseless and not breathing No CPR or chest compressions.   In Pre-Arrest Conditions (Patient Has Pulse and Is Breathing) May intubate, use advanced airway interventions and cardioversion/ACLS medications if appropriate or indicated. May transfer to ICU.   Consent: Discussion documented in EHR or advanced directives reviewed      03/19/24 1604           Code Status History     Date Active Date Inactive Code Status Order ID Comments User Context   03/19/2024 1250 03/19/2024 1604 Full Code [id number]  Jens Durand, MD Inpatient   03/18/2024 1558 03/19/2024 1250 Limited: Do not attempt resuscitation (DNR) -DNR-LIMITED -Do Not Intubate/DNI  [id number]  Jens Durand, MD Inpatient   03/18/2024 0059 03/18/2024 1558 Full Code [id number]  Cleatus Delayne GAILS, MD Inpatient       Camelia Lewis, NP  Please contact Palliative Medicine Team phone at [phone] for questions and concerns.

## 2024-03-30 NOTE — TOC Progression Note (Addendum)
 Transition of Care [facility]) - Progression Note    Patient Details  Name: JARYAH ARACENA MRN: [id number] Date of Birth: Apr 30, 1945  [facility] CM/SW Contact  Lauraine JAYSON Carpen, LCSW Phone Number: 03/30/2024, 8:35 AM  Clinical Narrative:  CSW left voicemail for Clotilda at [facility] to let her know that the PEG tube was placed yesterday and to see if they will be able to offer patient a LTC bed.   10:53 am: Received call back from [facility]. They are able to accept patient for LTC whenever stable. She updated daughter yesterday. CSW will continue to follow progress and facilitate discharge when ready.  11:19 am: Per MD, patient will need air mattress when she goes to SNF. Clotilda is aware.  Expected Discharge Plan: Skilled Nursing Facility Barriers to Discharge: Continued Medical Work up               Expected Discharge Plan and Services     Post Acute Care Choice: Skilled Nursing Facility Living arrangements for the past 2 months: Single Family Home                                       Social Drivers of Health (SDOH) Interventions SDOH Screenings   Food Insecurity: Patient Unable To Answer (03/18/2024)  Housing: Patient Unable To Answer (03/18/2024)  Transportation Needs: Patient Unable To Answer (03/18/2024)  Utilities: Patient Unable To Answer (03/18/2024)  Financial Resource Strain: High Risk (09/01/2023)   Received from [facility]  Social Connections: Patient Unable To Answer (03/18/2024)  Tobacco Use: Medium Risk (03/17/2024)    Readmission Risk Interventions     No data to display

## 2024-03-30 NOTE — Progress Notes (Signed)
   03/30/24 1800  Vitals  Temp 99.7 F (37.6 C)  Temp Source Axillary  Level of Consciousness  Level of Consciousness Responds to Pain  MEWS COLOR  MEWS Score Color Red  MEWS Score  MEWS Temp 0  MEWS Systolic 0  MEWS Pulse 2  MEWS RR 3  MEWS LOC 2  MEWS Score 7

## 2024-03-30 NOTE — Progress Notes (Signed)
 OT Cancellation Note  Patient Details Name: Marie Pham MRN: [id number] DOB: 1945/08/31   Cancelled Treatment:    Reason Eval/Treat Not Completed: Medical issues which prohibited therapy. Pt currently with fever of 101.7, red MEWS with a score of 9. OT will re-attempt at later date as medically appropriate.   Breyon Sigg L. Chyenne Sobczak, OTR/L  03/30/24, 10:22 AM

## 2024-03-30 NOTE — Progress Notes (Signed)
 Patient rectal temp is 102.9 prn tylenol  not effective at this time. , administered PRN ibuprofen via PEG tube, patient given a bath, wound care completed to sacrum., patient is not responsive at this time, remains tachypneic , ice packs applied to patients groin and neck as further intervention to treat fever. Patient repositioned and turned for comfort.  Dr. Trellis is aware, MEWS remains RED at this time  03/30/24 1225  Assess: MEWS Score  Temp (!) 102.9 F (39.4 C)  Assess: MEWS Score  MEWS Temp 2  MEWS Systolic 0  MEWS Pulse 2  MEWS RR 3  MEWS LOC 2  MEWS Score 9  MEWS Score Color Red  Assess: if the MEWS score is Yellow or Red  Were vital signs accurate and taken at a resting state? Yes  Does the patient meet 2 or more of the SIRS criteria? Yes  Does the patient have a confirmed or suspected source of infection? No  MEWS guidelines implemented  No, previously red, continue vital signs every 4 hours  Notify: Charge Nurse/RN  Name of Charge Nurse/RN Notified [place], RN  Provider Notification  Provider Name/Title Daria Trellis, MD  Date Provider Notified 03/30/24  Time Provider Notified 1250  Method of Notification Page  Notification Reason Other (Comment)  Provider response Other (Comment)  Assess: SIRS CRITERIA  SIRS Temperature  1  SIRS Respirations  1  SIRS Pulse 1  SIRS WBC 1  SIRS Score Sum  4

## 2024-03-31 ENCOUNTER — Inpatient Hospital Stay

## 2024-03-31 DIAGNOSIS — G9340 Encephalopathy, unspecified: Secondary | ICD-10-CM

## 2024-03-31 DIAGNOSIS — I639 Cerebral infarction, unspecified: Secondary | ICD-10-CM | POA: Diagnosis not present

## 2024-03-31 DIAGNOSIS — R059 Cough, unspecified: Secondary | ICD-10-CM

## 2024-03-31 LAB — GLUCOSE, CAPILLARY
Glucose-Capillary: 159 mg/dL — ABNORMAL HIGH (ref 70–99)
Glucose-Capillary: 167 mg/dL — ABNORMAL HIGH (ref 70–99)
Glucose-Capillary: 169 mg/dL — ABNORMAL HIGH (ref 70–99)
Glucose-Capillary: 171 mg/dL — ABNORMAL HIGH (ref 70–99)
Glucose-Capillary: 176 mg/dL — ABNORMAL HIGH (ref 70–99)
Glucose-Capillary: 207 mg/dL — ABNORMAL HIGH (ref 70–99)
Glucose-Capillary: 223 mg/dL — ABNORMAL HIGH (ref 70–99)

## 2024-03-31 LAB — CBC
HCT: 38.4 % (ref 36.0–46.0)
Hemoglobin: 11.8 g/dL — ABNORMAL LOW (ref 12.0–15.0)
MCH: 28.6 pg (ref 26.0–34.0)
MCHC: 30.7 g/dL (ref 30.0–36.0)
MCV: 93.2 fL (ref 80.0–100.0)
Platelets: 314 K/uL (ref 150–400)
RBC: 4.12 MIL/uL (ref 3.87–5.11)
RDW: 14 % (ref 11.5–15.5)
WBC: 15.5 K/uL — ABNORMAL HIGH (ref 4.0–10.5)
nRBC: 0 % (ref 0.0–0.2)

## 2024-03-31 LAB — BASIC METABOLIC PANEL WITH GFR
Anion gap: 5 (ref 5–15)
BUN: 33 mg/dL — ABNORMAL HIGH (ref 8–23)
CO2: 32 mmol/L (ref 22–32)
Calcium: 8.8 mg/dL — ABNORMAL LOW (ref 8.9–10.3)
Chloride: 112 mmol/L — ABNORMAL HIGH (ref 98–111)
Creatinine, Ser: 0.57 mg/dL (ref 0.44–1.00)
GFR, Estimated: >60 mL/min (ref >60)
Glucose, Bld: 179 mg/dL — ABNORMAL HIGH (ref 70–99)
Potassium: 4.6 mmol/L (ref 3.5–5.1)
Sodium: 149 mmol/L — ABNORMAL HIGH (ref 135–145)

## 2024-03-31 MED ORDER — IOHEXOL 300 MG/ML  SOLN
100.0000 mL | Freq: Once | INTRAMUSCULAR | Status: AC | PRN
  Administered 2024-03-31: 100 mL via INTRAVENOUS

## 2024-03-31 MED ORDER — FREE WATER
300.0000 mL | Status: DC
  Administered 2024-03-31 – 2024-04-04 (×25): 300 mL

## 2024-03-31 NOTE — Plan of Care (Signed)

## 2024-03-31 NOTE — Progress Notes (Signed)
 PROGRESS NOTE    Marie Pham  FMW:[id number] DOB: January 21, 1946 DOA: 03/17/2024 PCP: Tobie Domino, MD    Brief Narrative:  Marie Pham is a 78 y.o. female with PMHx of HTN, chronic back pain, arthritis, OSA, depression, asthma/bronchitis, and baseline limited mobility (ambulates with walker) who presented with poor oral intake and new speech difficulty.  Family reported she appeared to understand but was unable to respond verbally.  Last well-known was >24 hours before arrival.  In ED, she was afebrile with a temp of 98.7 F, RR 18, HR 115, BP 120/99, SpO2 95% on RA, placed on 2 L O2 for comfort.  Initial labs showed mild polycythemia, hyponatremia (NA 147), Troponins were negative, mildly elevated D-dimer to 0.92.  EKG showed sinus tachycardia, prolonged QTc to 494 ms. CTA head/neck demonstrated an acute left basal ganglia infarct.  She received an NS bolus, Cardizem , and aspirin  suppository.  She was admitted for stroke management.  MRI (11/22) confirmed bilateral ACA territory infarcts with cytotoxic edema (L >R).  TTE (11/22) showed LVEF 60-65%, G3DD, no atrial level shunt detected by color-flow Doppler. She failed swallow eval and underwent dobhoff feeding tube placemenet by IR on 11/24. Shortly after admission she became progressively more somnolent and then minimally responsive.  EEG (11/26) showed generalized cortical dysfunction without seizures. Repeat MRI (11/27) showed further evolution of the bilateral ACA strokes without hemorrhagic transformation.  Neurology was consulted and advised that prognosis of meaningful neurologic recovery is extremely poor. She underwent PEG tube placement on 12/3.   Her course was complicated by recurrent fevers and persistent tachypnea.  There was concern for aspiration on 11/27 and she was started on Rocephin  and Flagyl .  Fevers recurred, prompting ID involvement.  CXR repeated and demonstrated low lung volumes and bronchovascular crowding without focal  consolidation.  1 of 2 blood cultures (12/1) was positive for MRSE (likely contaminant), and repeat cultures were sent (12/2) prior to antibiotic change which showed no growth.  Antibiotics were escalated to cefepime  plus vancomycin , later changed to meropenem  due to concern for cefepime  worsening encephalopathy.  C. difficile PCR remained negative.  Given extensive bifrontal injury and cytotoxic edema, ID felt some fevers were likely central.   Neurologically, she has remained severely encephalopathic.  She intermittently opens her eyes to voice, and per daughter had occasional spontaneous eye opening and brief tracking, but no sustained purposeful responsiveness.  Prognosis remains extremely guarded.    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 - PEG placed 12/3, internal nutrition per PEG started per RD - Continue aspirin , Lipitor , Plavix  (resumed 1 day after PEG placement) - PT OT evaluated, recommended SNF - Palliative care following - Neurology reengaged - recommended CT head noncon to rule out increased ICP - CT head noncon showed evolving bilateral ACA infarcts without hemorrhaging transformation or mass effect, chronic small vessel ischemic white matter changes and lacunar infarct of right thalamus  Recurrent fevers  SIRS Possible aspiration pneumonitis +/- central fever / sympathetic hyperactivity - CTA chest (11/22) with bilateral pneumonitis likely aspiration related, no PE - Treated initially with Rocephin  and azithromycin , later Rocephin  and Flagyl  after concerns for aspiration  overnight on 11/27 - Had recurrent fevers >  101 F on 12/1 with leukocytosis, blood cultures were obtained - CXR without focal infectious source - ID was consulted - Blood cultures (12/1) 1 of 2 positive for MRSE, likely contaminant.  Blood cultures repeated on 12/2 prior to starting vancomycin  - Antibiotics escalated to cefepime  plus vancomycin , later revised to meropenem  per ID - C. Diff PCR negative - Blood cultures (12/2) NGTD - UA (12/3) negative  - On 12/4. continued to be febrile with Tmax 102.9 F and leukocytosis to 18.1 - Neurology reengaged due to concern for central fever, recommended scheduled Tylenol , propranolol  10 mg TID, gradual taper off Coreg , cooling blankets. Recommended reconsult if no improvement in fevers in 3 days.  - ID recommended obtaining LE doppler to rule out DVT, possibly followed by CT c/a/p if negative - LE doppler negative for DVTs bilaterally - Afebrile thus far today, leukocytosis improving to 15.5 - Continue Vancomycin  and Cefepime  - CT chest/abd/pelvis with contrast ordered  Tachypnea  Tachycardia - Etiology unclear, possibly related to central fevers and possible sympathetic hyperactivity/storming - Propranalol 10 mg TID started per neurology's recommendation  - Tachycardia improving to 90-100s today - Tachypnea is persistent with rapid shallow breaths  Dysphagia secondary to CVA - PEG tube placed on 12/3 - RD following for continuation of tube feeds  Hypernatremia 2/2 free water  deficit - Na trending up to 149, likely increased losses from tachypnea/fevers - Discussed with RD, free water  flushes adjusted - Continue to monitor Na levels  Severe aortic stenosis  grade 3 diastolic dysfunction - Stable hemodynamics - Will need outpatient follow-up with cardiologist if feasible  Hypertension - SBP in 140-150s - Propranolol  started per neurology for possible central fevers / sympathetic hyperactivity - Coreg  tapered off over 2 doses    Chronic back pain/lumbar radiculitis - Supportive management - Currently patient is unresponsive and unable to indicate pain level   Hyperglycemia - Hgb A1c 6.1 (11/22) - Continue to monitor given tube feeds - SSI    Sacral pressure wound   - Air mattress in place - Change position every 2 hour - Wound care consulted and following  Goals of care - Family wishes to continue full scope of care and observe for any neurologic improvement over the coming months before reconsidering hospice - Will need referral to outpatient palliative care through [facility] on discharge - Prognosis remains extremely guarded  DVT prophylaxis: enoxaparin  (LOVENOX ) injection 40 mg Start: 03/30/24 1100 Place and maintain sequential compression device Start: 03/28/24 1531   Code Status:   Code Status: Do not attempt resuscitation (DNR) PRE-ARREST INTERVENTIONS DESIRED  Family Communication: Discussed patient's current plan of care and extremely guarded prognosis with patient's daughter at bedside. Would like to continue to pursue further workup at this time including the CT chest/abd/pelvis  Disposition Plan: Discharge to SNF  PT - Follow Up Recommendations: Long-term institutional care without follow-up therapy OT - Follow Up Recommendations: Long-term institutional care without follow-up therapy  DME Needs: PT equipment: Other (comment) (ongoing assessment due to lack of progress)    Level of care: Progressive  Consultants:  Neurology, infectious disease  Procedures:  IR Dobhoff placement - 11/24 IR PEG tube placement - 12/3  Antimicrobials: Vancomycin  and meropenem    Subjective: Patient evaluated at bedside in the presence of patient's daughter and family friend. Remains unresponsive. Very limited and brief eye opening to verbal and noxious stimuli.   Objective: Vitals:   03/30/24 1900 03/30/24 2019 03/31/24 0005 03/31/24 0506  BP:  (!) 144/57 ROLLEN)  147/60 (!) 159/70   Pulse: 97     Resp: (!) 30     Temp:  99.3 F (37.4 C) 99.2 F (37.3 C) 99.2 F (37.3 C)  TempSrc:  Axillary Axillary Axillary  SpO2: 98%     Weight:      Height:        Intake/Output Summary (Last 24 hours) at 03/31/2024 0550 Last data filed at 03/31/2024 0400 Gross per 24 hour  Intake 1400 ml  Output --  Net 1400 ml   Filed Weights   03/29/24 1500 03/30/24 0500  Weight: 85.8 kg 85.2 kg    Examination:  Physical Exam Constitutional:      Appearance: She is ill-appearing.     Interventions: Nasal cannula in place.  HENT:     Mouth/Throat:     Mouth: Mucous membranes are dry.  Eyes:     Comments: Pupils equal, constricted, very minimally reactive, no purposeful eye movement, does not track. Very brief and limited opening of eyes to noxious stimuli today.  Cardiovascular:     Rate and Rhythm: Regular rhythm. Tachycardia present.     Heart sounds: Murmur heard.     Systolic murmur is present.  Pulmonary:     Effort: Pulmonary effort is normal. Tachypnea present. No respiratory distress.     Comments: Unable to auscultate posterior lung fields. Breaths are rapid and shallow.  Abdominal:     General: There is no distension.     Palpations: Abdomen is soft.     Tenderness: There is no abdominal tenderness. There is no guarding.     Comments: PEG tube in place, dressing c/d/i   Genitourinary:    Comments: Purewick in place. FMS in place.  Musculoskeletal:     Right lower leg: No edema.     Left lower leg: No edema.  Skin:    General: Skin is warm and dry.     Capillary Refill: Capillary refill takes less than 2 seconds.  Neurological:     Mental Status: She is unresponsive.     GCS: GCS eye subscore is 1. GCS verbal subscore is 1. GCS motor subscore is 1.     Data Reviewed: I have personally reviewed following labs and imaging studies  CBC: Recent Labs  Lab 03/27/24 1210 03/28/24 0513 03/29/24 0449 03/30/24 0425 03/31/24 0434  WBC 13.2* 15.0* 17.6*  18.1* 15.5*  NEUTROABS 8.7* 10.6* 13.5*  --   --   HGB 11.9* 12.2 12.3 12.6 11.8*  HCT 37.6 38.5 38.5 39.8 38.4  MCV 89.7 90.6 90.4 90.7 93.2  PLT 254 265 296 322 314   Basic Metabolic Panel: Recent Labs  Lab 03/26/24 0519 03/27/24 1210 03/28/24 0513 03/29/24 0449 03/30/24 0425 03/31/24 0434  NA 148* 146* 145 147* 148*  148* 149*  K 4.4 4.5 4.8 4.5 4.9  4.8 4.6  CL 109 108 107 107 110  109 112*  CO2 32 31 29 32 31  31 32  GLUCOSE 201* 231* 227* 111* 162*  164* 179*  BUN 28* 26* 21 22 29*  29* 33*  CREATININE 0.73 0.73 0.68 0.65 0.74  0.74 0.57  CALCIUM  8.9 8.7* 8.9 9.1 9.1  9.1 8.8*  PHOS 3.2 4.0 3.8 4.3 3.3  --    GFR: Estimated Creatinine Clearance: 65.5 mL/min (by C-G formula based on SCr of 0.57 mg/dL). Liver Function Tests: Recent Labs  Lab 03/26/24 0519 03/27/24 1210 03/28/24 0513 03/29/24 0449 03/30/24 0425  AST  --   --  39  --   --   ALT  --   --  33  --   --   ALKPHOS  --   --  81  --   --   BILITOT  --   --  0.2  --   --   PROT  --   --  6.6  --   --   ALBUMIN 3.1* 3.1* 3.2*  3.2* 3.2* 3.3*   No results for input(s): LIPASE, AMYLASE in the last 168 hours. No results for input(s): AMMONIA in the last 168 hours.  Coagulation Profile: No results for input(s): INR, PROTIME in the last 168 hours. Cardiac Enzymes: Recent Labs  Lab 03/29/24 0449  CKTOTAL 139   BNP (last 3 results) Recent Labs    03/29/24 0449  PROBNP 286.0   HbA1C: No results for input(s): HGBA1C in the last 72 hours. CBG: Recent Labs  Lab 03/30/24 1207 03/30/24 1740 03/30/24 2017 03/31/24 0011 03/31/24 0509  GLUCAP 199* 143* 191* 169* 171*   Lipid Profile: No results for input(s): CHOL, HDL, LDLCALC, TRIG, CHOLHDL, LDLDIRECT in the last 72 hours. Thyroid  Function Tests: No results for input(s): TSH, T4TOTAL, FREET4, T3FREE, THYROIDAB in the last 72 hours. Anemia Panel: No results for input(s): VITAMINB12, FOLATE,  FERRITIN, TIBC, IRON, RETICCTPCT in the last 72 hours. Sepsis Labs: No results for input(s): PROCALCITON, LATICACIDVEN in the last 168 hours.  Recent Results (from the past 240 hours)  Culture, blood (Routine X 2) w Reflex to ID Panel     Status: None (Preliminary result)   Collection Time: 03/27/24 12:10 PM   Specimen: BLOOD  Result Value Ref Range Status   Specimen Description BLOOD BLOOD RIGHT ARM  Final   Special Requests   Final    BOTTLES DRAWN AEROBIC AND ANAEROBIC Blood Culture adequate volume   Culture   Final    NO GROWTH 2 DAYS Performed at [facility], [address], [place], [place] [address]    Report Status PENDING  Incomplete  Culture, blood (Routine X 2) w Reflex to ID Panel     Status: Abnormal   Collection Time: 03/27/24 12:10 PM   Specimen: BLOOD LEFT ARM  Result Value Ref Range Status   Specimen Description   Final    BLOOD LEFT ARM Performed at [facility], [address] [place], [place] [address]    Special Requests   Final    BOTTLES DRAWN AEROBIC AND ANAEROBIC Blood Culture adequate volume Performed at [facility], [address] [place], [place] [address]    Culture  Setup Time   Final    GRAM POSITIVE COCCI IN BOTH AEROBIC AND ANAEROBIC BOTTLES CRITICAL RESULT CALLED TO, READ BACK BY AND VERIFIED WITH: TREY GREENWOOD 03/28/24 0928 MW    Culture (A)  Final    STAPHYLOCOCCUS EPIDERMIDIS THE SIGNIFICANCE OF ISOLATING THIS ORGANISM FROM A SINGLE SET OF BLOOD CULTURES WHEN MULTIPLE SETS ARE DRAWN IS UNCERTAIN. PLEASE NOTIFY THE MICROBIOLOGY DEPARTMENT WITHIN ONE WEEK IF SPECIATION AND SENSITIVITIES ARE REQUIRED. Performed at [facility], [address] [place], [place] [address]    Report Status 03/30/2024 FINAL  Final  Blood Culture ID Panel (Reflexed)     Status: Abnormal   Collection Time: 03/27/24 12:10 PM  Result Value Ref Range Status   Enterococcus faecalis NOT DETECTED NOT DETECTED Final    Enterococcus Faecium NOT DETECTED NOT DETECTED Final   Listeria monocytogenes NOT DETECTED NOT DETECTED Final  Staphylococcus species DETECTED (A) NOT DETECTED Final    Comment: CRITICAL RESULT CALLED TO, READ BACK BY AND VERIFIED WITH: TREY GREENWOOD 03/28/24 0928 MW    Staphylococcus aureus (BCID) NOT DETECTED NOT DETECTED Final   Staphylococcus epidermidis DETECTED (A) NOT DETECTED Final    Comment: Methicillin (oxacillin) resistant coagulase negative staphylococcus. Possible blood culture contaminant (unless isolated from more than one blood culture draw or clinical case suggests pathogenicity). No antibiotic treatment is indicated for blood  culture contaminants. CRITICAL RESULT CALLED TO, READ BACK BY AND VERIFIED WITH: TREY GREENWOOD 03/28/24 0928 MW    Staphylococcus lugdunensis NOT DETECTED NOT DETECTED Final   Streptococcus species NOT DETECTED NOT DETECTED Final   Streptococcus agalactiae NOT DETECTED NOT DETECTED Final   Streptococcus pneumoniae NOT DETECTED NOT DETECTED Final   Streptococcus pyogenes NOT DETECTED NOT DETECTED Final   A.calcoaceticus-baumannii NOT DETECTED NOT DETECTED Final   Bacteroides fragilis NOT DETECTED NOT DETECTED Final   Enterobacterales NOT DETECTED NOT DETECTED Final   Enterobacter cloacae complex NOT DETECTED NOT DETECTED Final   Escherichia coli NOT DETECTED NOT DETECTED Final   Klebsiella aerogenes NOT DETECTED NOT DETECTED Final   Klebsiella oxytoca NOT DETECTED NOT DETECTED Final   Klebsiella pneumoniae NOT DETECTED NOT DETECTED Final   Proteus species NOT DETECTED NOT DETECTED Final   Salmonella species NOT DETECTED NOT DETECTED Final   Serratia marcescens NOT DETECTED NOT DETECTED Final   Haemophilus influenzae NOT DETECTED NOT DETECTED Final   Neisseria meningitidis NOT DETECTED NOT DETECTED Final   Pseudomonas aeruginosa NOT DETECTED NOT DETECTED Final   Stenotrophomonas maltophilia NOT DETECTED NOT DETECTED Final   Candida albicans  NOT DETECTED NOT DETECTED Final   Candida auris NOT DETECTED NOT DETECTED Final   Candida glabrata NOT DETECTED NOT DETECTED Final   Candida krusei NOT DETECTED NOT DETECTED Final   Candida parapsilosis NOT DETECTED NOT DETECTED Final   Candida tropicalis NOT DETECTED NOT DETECTED Final   Cryptococcus neoformans/gattii NOT DETECTED NOT DETECTED Final   Methicillin resistance mecA/C DETECTED (A) NOT DETECTED Final    Comment: CRITICAL RESULT CALLED TO, READ BACK BY AND VERIFIED WITH: MOSE BLEW 03/28/24 9071 MW Performed at [facility], [address] [place], [place] [address]   C Difficile Quick Screen (NO PCR Reflex)     Status: None   Collection Time: 03/28/24  9:15 AM   Specimen: STOOL  Result Value Ref Range Status   C Diff antigen NEGATIVE NEGATIVE Final   C Diff toxin NEGATIVE NEGATIVE Final   C Diff interpretation No C. difficile detected.  Final    Comment: Performed at [facility], [address] [place], [place] [address]  Culture, blood (Routine X 2) w Reflex to ID Panel     Status: None (Preliminary result)   Collection Time: 03/28/24 10:46 AM   Specimen: BLOOD RIGHT HAND  Result Value Ref Range Status   Specimen Description BLOOD RIGHT HAND  Final   Special Requests   Final    BOTTLES DRAWN AEROBIC AND ANAEROBIC Blood Culture adequate volume   Culture   Final    NO GROWTH < 24 HOURS Performed at [facility], [address] [place], [place] [address]    Report Status PENDING  Incomplete  Culture, blood (Routine X 2) w Reflex to ID Panel     Status: None (Preliminary result)   Collection Time: 03/28/24 10:46 AM   Specimen: BLOOD RIGHT ARM  Result Value Ref Range Status  Specimen Description BLOOD RIGHT ARM  Final   Special Requests   Final    BOTTLES DRAWN AEROBIC AND ANAEROBIC Blood Culture adequate volume   Culture   Final    NO GROWTH < 24 HOURS Performed at [facility], [address], [place], [place]  [address]    Report Status PENDING  Incomplete     Radiology Studies: US  Venous Img Lower Bilateral (DVT) Result Date: 03/30/2024 CLINICAL DATA:  10027 Tachypnea 10027 [id number] Fever [id number] EXAM: BILATERAL LOWER EXTREMITY VENOUS DOPPLER ULTRASOUND TECHNIQUE: Gray-scale sonography with graded compression, as well as color Doppler and duplex ultrasound were performed to evaluate the lower extremity deep venous systems from the level of the common femoral vein and including the common femoral, femoral, profunda femoral, popliteal and calf veins including the posterior tibial, peroneal and gastrocnemius veins when visible. The superficial great saphenous vein was also interrogated. Spectral Doppler was utilized to evaluate flow at rest and with distal augmentation maneuvers in the common femoral, femoral and popliteal veins. COMPARISON:  None Available. FINDINGS: RIGHT LOWER EXTREMITY Common Femoral Vein: No evidence of thrombus. Normal compressibility, respiratory phasicity and response to augmentation. Saphenofemoral Junction: No evidence of thrombus. Normal compressibility and flow on color Doppler imaging. Profunda Femoral Vein: No evidence of thrombus. Normal compressibility and flow on color Doppler imaging. Femoral Vein: No evidence of thrombus. Normal compressibility, respiratory phasicity and response to augmentation. Popliteal Vein: No evidence of thrombus. Normal compressibility, respiratory phasicity and response to augmentation. Calf Veins: No evidence of thrombus. Normal compressibility and flow on color Doppler imaging. Superficial Great Saphenous Vein: No evidence of thrombus. Normal compressibility. Other Findings:  None. LEFT LOWER EXTREMITY Common Femoral Vein: No evidence of thrombus. Normal compressibility, respiratory phasicity and response to augmentation. Saphenofemoral Junction: No evidence of thrombus. Normal compressibility and flow on color Doppler imaging. Profunda Femoral Vein: No evidence  of thrombus. Normal compressibility and flow on color Doppler imaging. Femoral Vein: No evidence of thrombus. Normal compressibility, respiratory phasicity and response to augmentation. Popliteal Vein: No evidence of thrombus. Normal compressibility, respiratory phasicity and response to augmentation. Calf Veins: No evidence of thrombus. Normal compressibility and flow on color Doppler imaging. Superficial Great Saphenous Vein: No evidence of thrombus. Normal compressibility. Other Findings:  None. IMPRESSION: Negative for deep venous thrombosis within both legs. Electronically Signed   By: Rogelia Myers M.D.   On: 03/30/2024 22:21   CT HEAD WO CONTRAST ( ) Result Date: 03/30/2024 EXAM: CT HEAD WITHOUT CONTRAST 03/30/2024 05:17:31 PM TECHNIQUE: CT of the head was performed without the administration of intravenous contrast. Automated exposure control, iterative reconstruction, and/or weight based adjustment of the mA/kV was utilized to reduce the radiation dose to as low as reasonably achievable. COMPARISON: 03/17/2024. CLINICAL HISTORY: Rule out increased intracranial pressure. FINDINGS: BRAIN AND VENTRICLES: No acute hemorrhage. 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 Chronic small vessel ischemic changes of the white matter. Chronic lacunar infarct right thalamus. No hydrocephalus. No extra-axial collection. No mass effect or midline shift. ORBITS: No acute abnormality. SINUSES: No acute abnormality. SOFT TISSUES AND SKULL: No acute soft tissue abnormality. No skull fracture. Carotid vascular calcification. IMPRESSION: 1. 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 No hemorrhagic transformation or mass effect. 2. Chronic small vessel ischemic changes of the white matter and chronic lacunar infarct right thalamus. Electronically signed by: Franky Stanford MD 03/30/2024 07:57 PM EST RP Workstation: [id number]   IR GASTROSTOMY TUBE MOD SED Result Date:  03/29/2024 INDICATION: Dysphagia.  Altered mental status EXAM:  PERCUTANEOUS GASTROSTOMY TUBE PLACEMENT COMPARISON:  Chest XR, earlier same day.  CT AP, 03/17/2024. MEDICATIONS: The patient was on scheduled IV antibiotics Glucagon  0.5 mg IV CONTRAST:  15 mL of Isovue  300 administered into the gastric lumen. ANESTHESIA/SEDATION: Local anesthetic and single agent sedation was employed during this procedure. A total of fentanyl  25 mcg was administered intravenously. The patient's level of consciousness and vital signs were monitored continuously by radiology nursing throughout the procedure under my direct supervision. FLUOROSCOPY: Radiation Exposure Index and estimated peak skin dose (PSD); Reference air kerma (RAK), 7 mGy. COMPLICATIONS: None immediate. PROCEDURE: Informed written consent was obtained from the patient and/or patient's representative following explanation of the procedure, risks, benefits and alternatives. A time out was performed prior to the initiation of the procedure. Maximal barrier sterile technique utilized including caps, mask, sterile gowns, sterile gloves, large sterile drape, hand hygiene and sterile prep. The LEFT costal margin and barium opacified transverse colon were identified and avoided. Air was injected into the stomach for insufflation and visualization under fluoroscopy. Under sterile conditions and local anesthesia, 2 T tacks were utilized to pexy the anterior aspect of the stomach against the ventral abdominal wall. Contrast injection confirmed appropriate positioning of each of the T tacks. An incision was made between the T tacks and a 17 gauge trocar needle was utilized to access the stomach. Needle position was confirmed within the stomach with aspiration of air and injection of a small amount of contrast. A stiff guidewire was advanced into the gastric lumen and under intermittent fluoroscopic guidance, the access needle was exchanged for a telescoping peel-away sheath,  ultimately allowing placement of a 18 Fr balloon retention gastrostomy tube. The retention balloon was insufflated with a mixture of dilute saline and contrast and pulled taut against the anterior wall of the stomach. The external disc was cinched. Contrast injection confirms positioning within the stomach. Several spot radiographic images were obtained in various obliquities for documentation. The patient tolerated procedure well without immediate post procedural complication. FINDINGS: After successful fluoroscopic guided placement, the gastrostomy tube is appropriately positioned with internal retention balloon against the ventral aspect of the gastric lumen. IMPRESSION: Successful fluoroscopic insertion of a 18 Fr balloon retention gastrostomy tube. The gastrostomy may be used immediately for medication administration and in 4 hrs for the initiation of feeds. RECOMMENDATIONS: The patient will return to [facility] (VIR) for routine feeding tube evaluation and exchange in 6 months. Thom Hall, MD [facility] Electronically Signed   By: Thom Hall M.D.   On: 03/29/2024 16:05    Scheduled Meds:  vitamin C   500 mg Per Tube BID   aspirin   81 mg Per NG tube Daily   atorvastatin   80 mg Per NG tube Daily   carvedilol   3.125 mg Per NG tube BID WC   clopidogrel   75 mg Per Tube Daily   collagenase    Topical Daily   enoxaparin  (LOVENOX ) injection  40 mg Subcutaneous Q24H   feeding supplement (PROSource TF20)  60 mL Per Tube Daily   fiber supplement (BANATROL TF)  60 mL Per Tube BID   free water   250 mL Per Tube Q4H   insulin  aspart  0-15 Units Subcutaneous Q4H   latanoprost   1 drop Both Eyes QHS   losartan   25 mg Per Tube Daily   multivitamin with minerals  1 tablet Per Tube Daily   nutrition supplement (JUVEN)  1 packet Per Tube BID BM   mouth  rinse  15 mL Mouth Rinse 4 times per day   propranolol   10 mg Per Tube TID    zinc  sulfate (50mg  elemental zinc )  220 mg Per Tube Daily   Continuous Infusions:  feeding supplement (OSMOLITE 1.5 CAL) 1,000 mL (03/30/24 2100)   meropenem  (MERREM ) IV 1 g (03/31/24 0521)     Unresulted Labs (From admission, onward)     Start     Ordered   03/30/24 0500  CBC  Daily,   R     Question:  Specimen collection method  Answer:  Lab=Lab collect   03/29/24 1926   03/30/24 0500  Basic metabolic panel with GFR  Daily,   R     Question:  Specimen collection method  Answer:  Lab=Lab collect   03/29/24 1926   03/25/24 0500  Creatinine, serum  (enoxaparin  (LOVENOX )    CrCl >/= 30 ml/min)  Weekly,   TIMED     Comments: while on enoxaparin  therapy    03/18/24 0059             LOS:  LOS: 13 days   Time Spent: 45 minutes  Abisai Coble Al-Sultani, MD [facility]  If 7PM-7AM, please contact night-coverage  03/31/2024, 5:50 AM

## 2024-03-31 NOTE — Progress Notes (Signed)
 Daily Progress Note   Patient Name: Marie Pham       Date: 03/31/2024 DOB: 1945-07-05  Age: 78 y.o. MRN#: [id number] Attending Physician: Al-Sultani, Anmar, MD Primary Care Physician: Tobie Domino, MD Admit Date: 03/17/2024  Reason for Consultation/Follow-up: Establishing goals of care  HPI/Brief Hospital Review: 78 y.o. female  with past medical history of hypertension, chronic back pain, depression and sleep apnea admitted from home on 03/17/2024 with initial complaints of worsening chronic back pain. Reportedly from family in ED, Marie Pham had poor PO intake and less activity day prior to admission. Last known well time established in ED >24 hours.   CTA obtained in ED revealed hypodensity in the left basal ganglia suspicious for acute/recent infarct recommendations for MRI to follow-up--occluded versus severely stenotic distal right nondominant vertebral artery--severe bilateral P2 PCA stenosis--- moderate left vertebral artery origin stenosis--- mild to moderate left M1 and M2 MCA stenoses MRI obtained and revealing confluent left ACA territory [name] acute infarct involving inferomedial left caudate nucleus, small area of acute infarct in the proximal right ACA territory and several subcentimeter foci of acute ischemia in the right distal MCA/ACA watershed--cytotoxic edema with no hemorrhagic transformation   CTA chest without evidence of acute pulmonary embolism--concern for possible air trapping or pneumonitis--noted cardiomegaly with coronary artery calcifications   Palliative medicine was consulted for assisting with goals of care conversations.  Subjective: Extensive chart review has been completed prior to meeting patient including labs, vital signs, imaging, progress notes, orders,  and available advanced directive documents from current and previous encounters.    Visited with Marie Pham at her bedside.  She is resting in bed with eyes closed.  She does not acknowledge my presence in the room.  She attempts to open her eyes to calling of her name from her daughter.  Neurology reconsulted and ID on board due to intermittent fevers without clear infectious source and continuing encephalopathy.  Imaging ordered.  Without clear infectious source, fevers thought to be central in origin.  Spoke with daughter at bedside he was present during neurology and infectious disease conversations.  Daughter shares she has a good understanding of plan and without follow-up questions.  Plan remains for Marie Pham to discharge to long-term care facility.  Daughter shares family remains hopeful for some neurological recovery in the future.  No changes to goals of care at this time.  Answered and addressed all questions and concerns.  PMT to continue to follow for ongoing needs and support.  Objective:  Physical Exam Constitutional:      General: She is not in acute distress.    Appearance: She is ill-appearing.  HENT:     Head: Normocephalic.     Mouth/Throat:     Mouth: Mucous membranes are dry.  Pulmonary:     Effort: Pulmonary effort is normal. No respiratory distress.  Abdominal:     General: There is no distension.     Palpations: Abdomen is soft.  Skin:    General: Skin is warm and dry.  Neurological:     Comments: Minimally responsive             Vital Signs: BP (!) 102/21 (BP Location: Right Arm)   Pulse 97   Temp 99.1 F (37.3 C) (Oral)   Resp (!) 30   Ht 5' 5 (1.651 m)   Wt 85.2 kg   SpO2 98%   BMI 31.26 kg/m  SpO2: SpO2: 98 % O2 Device: O2 Device: Nasal Cannula O2 Flow Rate: O2 Flow Rate (L/min): 2 L/min   Palliative Care Assessment & Plan   Assessment/Recommendation/Plan  Continue with current plan of care   Thank you for allowing the Palliative  Medicine Team to assist in the care of this patient.   Visit includes: Detailed review of medical records (labs, imaging, vital signs), medically appropriate exam (mental status, respiratory, cardiac, skin), discussed with treatment team, counseling and educating patient, family and staff, documenting clinical information, medication management and coordination of care.  Waddell Lesches, DNP, AGNP-C Palliative Medicine   Please contact Palliative Medicine Team phone at [phone] for questions and concerns.

## 2024-03-31 NOTE — Progress Notes (Signed)
 [facility] Liaison Note  New referral for palliative care received from Lauraine Carpen, SW/CM.  Lauraine states that patient will be discharging to [facility] and the staff at [facility] has already sent in a referral request for Palliative.  Patient with plans to discharge once she is medically stable.  Hospital Liaison Team will continue to follow through final disposition.  Please call with any questions or concerns.  Saddie HILARIO Na, RN Nurse Liaison [phone]

## 2024-03-31 NOTE — Progress Notes (Signed)
 Nutrition Follow-up  DOCUMENTATION CODES:   Obesity unspecified  INTERVENTION:   TF via PEG:    Osmolite 1.5 @ 50 ml/hr    60 ml Prosource TF20 daily   300 ml free water  flush every 4 hours per MD   Tube feeding regimen provides 1880 kcal (100% of needs), 95 grams of protein, and 914 ml of H2O. Total free water : 2714 ml daily    -Continue MVI with minerals daily via tube -Continue 500 mg vitamin C  BID via tube -Continue 220 mg zinc  sulfate daily x 14 days via tube -Continue 60 ml Banatrol BID -Monitor Mg, K, and Phos and replete as needed secondary to high refeeding risk  -Continue 1 packet Juven BID via tube, each packet provides 95 calories, 2.5 grams of protein (collagen), and 9.8 grams of carbohydrate (3 grams sugar); also contains 7 grams of L-arginine and L-glutamine, 300 mg vitamin C , 15 mg vitamin E, 1.2 mcg vitamin B-12, 9.5 mg zinc , 200 mg calcium , and 1.5 g  Calcium  Beta-hydroxy-Beta-methylbutyrate to support wound healing   NUTRITION DIAGNOSIS:   Inadequate oral intake related to inability to eat, dysphagia as evidenced by NPO status.  Ongoing  GOAL:   Patient will meet greater than or equal to 90% of their needs  Met with TF  MONITOR:   Diet advancement, TF tolerance  REASON FOR ASSESSMENT:   Consult Wound healing  ASSESSMENT:   78 y.o. female with medical history significant for Hypertension, arthritis, chronic back pain, ambulance at baseline with a walker being admitted for an acute CVA.  11/22- NPO with ice chips 11/23- bedside NGT placement attempted by RN, unsuccessful; per SLP unable to assess due to lethargy 11/24- NGT placed via fluoroscopy (tip of tube in lumen per PA note); TF initiated 11/24- s/p BSE- NPO 11/25- rapid response called secondary to lethargy and tachypenia, SLP signed off 11/26- s/p EEG- consistent with a generalized nonspecific cerebral dysfunction, though triphasic waves are nonspecific and they can be associated with  toxic/metabolic etiologies of encephalopathy; no seizure activity noted 11/30- rectal tube placed secondary to diarrhea 12/1- NGT pulled out, new dobhoff tube placed via fluoroscopy  12/3- PEG placed, TF resumed  Reviewed I/O's: +1.4 L x 24 hours and +17.7 L since admission  Per [facility] notes, patient with Deep Tissue Pressure Injury now with non viable tissue on the left buttock; partial thickness skin loss over the remaining affected areas.   Osmolite 1.5 infusing via PEG at 50 ml/hr. Patient NPO secondary to dysphagia as a result of CVA and receiving sole source nutrition via PEG; anticipate long term need for enteral nutrition support/ PEG.    Patient remains with rectal tube. Patient on antibiotics, which may be contributing to diarrhea. Fiber supplement continues.   Reviewed weight history. Patient weight has ranged from 84.9 kg-87.1 kg over the past 7 days.   Case discussed with MD; free water  flushes adjusted due to hypernatremia. Discussed TF regimen for discharge.   Per TOC notes, plan for SNF and outpatient palliative services at discharge.   Medications reviewed and include vitamin C  and zinc  sulfate.   Labs reviewed: Na: 149, CBGS: 159-207 (inpatient orders for glycemic control are 0-15 units insulin  aspart every 4 hours).    Diet Order:   Diet Order             Diet NPO time specified  Diet effective midnight  EDUCATION NEEDS:   Education needs have been addressed  Skin:  Skin Assessment: Skin Integrity Issues: Skin Integrity Issues:: DTI DTI: sacrum and bilateral buttocks; dark discoloration evolving with red moist areas to buttocks and linear red moist area coccyx  Last BM:  03/30/24 (via rectal tube)  Height:   Ht Readings from Last 1 Encounters:  03/29/24 5' 5 (1.651 m)    Weight:   Wt Readings from Last 1 Encounters:  03/30/24 85.2 kg    Ideal Body Weight:  56.8 kg  BMI:  Body mass index is 31.26 kg/m.  Estimated  Nutritional Needs:   Kcal:  1800-2000  Protein:  95-110 grams  Fluid:  1.8-2.0 L    Margery ORN, RD, LDN, CDCES Registered Dietitian III Certified Diabetes Care and Education Specialist If unable to reach this RD, please use RD Inpatient group chat on secure chat between hours of 8am-4 pm daily

## 2024-03-31 NOTE — Progress Notes (Signed)
 Date of Admission:  03/17/2024     ID: Marie Pham is a 78 y.o. female  Principal Problem:   Acute CVA (cerebrovascular accident) [facility]) Active Problems:   Essential hypertension   AKI (acute kidney injury)   Hypernatremia   Abnormal EKG   SIRS (systemic inflammatory response syndrome) (HCC)   Severe aortic stenosis   Pneumonitis   Fever    Subjective: Fevers resolved. US  neg LE, CT chest abd pelvis pending    Medications:   vitamin C   500 mg Per Tube BID   aspirin   81 mg Per NG tube Daily   atorvastatin   80 mg Per NG tube Daily   clopidogrel   75 mg Per Tube Daily   collagenase    Topical Daily   enoxaparin  (LOVENOX ) injection  40 mg Subcutaneous Q24H   feeding supplement (PROSource TF20)  60 mL Per Tube Daily   fiber supplement (BANATROL TF)  60 mL Per Tube BID   free water   300 mL Per Tube Q4H   insulin  aspart  0-15 Units Subcutaneous Q4H   latanoprost   1 drop Both Eyes QHS   losartan   25 mg Per Tube Daily   multivitamin with minerals  1 tablet Per Tube Daily   nutrition supplement (JUVEN)  1 packet Per Tube BID BM   mouth rinse  15 mL Mouth Rinse 4 times per day   propranolol   10 mg Per Tube TID   zinc  sulfate (50mg  elemental zinc )  220 mg Per Tube Daily    Objective: Vital signs in last 24 hours: Patient Vitals for the past 24 hrs:  BP Temp Temp src Pulse Resp SpO2  03/31/24 1231 (!) 102/21 99.1 F (37.3 C) Oral -- -- --  03/31/24 0833 -- 99 F (37.2 C) Axillary -- -- --  03/31/24 0506 (!) 159/70 99.2 F (37.3 C) Axillary -- -- --  03/31/24 0005 (!) 147/60 99.2 F (37.3 C) Axillary -- -- --  03/30/24 2019 (!) 144/57 99.3 F (37.4 C) Axillary -- -- --  03/30/24 1900 -- -- -- 97 (!) 30 98 %  03/30/24 1800 -- 99.7 F (37.6 C) Axillary -- -- --     PHYSICAL EXAM:  General:obtunded- unresponsive.  Eyes: Conjunctivae clear, anicteric sclerae. Pupils are equal Lungs:b/l air entry Heart: Regular rate and rhythm, no murmur, rub or gallop.systolic  murmur Abdomen: Soft, obese . Bowel sounds normal. No masses Extremities: atrauamatic Sacrum - skin shearing  large islands 12/5  12/2   11/27    Skin no other rash Lymph: Cervical, supraclavicular normal. Neurologic: min responsive  Lab Results    Latest Ref Rng & Units 03/31/2024    4:34 AM 03/30/2024    4:25 AM 03/29/2024    4:49 AM  CBC  WBC 4.0 - 10.5 K/uL 15.5  18.1  17.6   Hemoglobin 12.0 - 15.0 g/dL 88.1  87.3  87.6   Hematocrit 36.0 - 46.0 % 38.4  39.8  38.5   Platelets 150 - 400 K/uL 314  322  296        Latest Ref Rng & Units 03/31/2024    4:34 AM 03/30/2024    4:25 AM 03/29/2024    4:49 AM  CMP  Glucose 70 - 99 mg/dL 820  835    837  888   BUN 8 - 23 mg/dL 33  29    29  22    Creatinine 0.44 - 1.00 mg/dL 9.42  9.25    9.25  9.34  Sodium 135 - 145 mmol/L 149  148    148  147   Potassium 3.5 - 5.1 mmol/L 4.6  4.8    4.9  4.5   Chloride 98 - 111 mmol/L 112  109    110  107   CO2 22 - 32 mmol/L 32  31    31  32   Calcium  8.9 - 10.3 mg/dL 8.8  9.1    9.1  9.1       Microbiology: Results for orders placed or performed during the hospital encounter of 03/17/24  Culture, blood (Routine X 2) w Reflex to ID Panel     Status: None (Preliminary result)   Collection Time: 03/27/24 12:10 PM   Specimen: BLOOD  Result Value Ref Range Status   Specimen Description BLOOD BLOOD RIGHT ARM  Final   Special Requests   Final    BOTTLES DRAWN AEROBIC AND ANAEROBIC Blood Culture adequate volume   Culture   Final    NO GROWTH 4 DAYS Performed at [facility], [address], [place], [place] [address]    Report Status PENDING  Incomplete  Culture, blood (Routine X 2) w Reflex to ID Panel     Status: Abnormal   Collection Time: 03/27/24 12:10 PM   Specimen: BLOOD LEFT ARM  Result Value Ref Range Status   Specimen Description   Final    BLOOD LEFT ARM Performed at [facility], [address] [place], [place] [address]    Special Requests   Final     BOTTLES DRAWN AEROBIC AND ANAEROBIC Blood Culture adequate volume Performed at [facility], [address] [place], [place] [address]    Culture  Setup Time   Final    GRAM POSITIVE COCCI IN BOTH AEROBIC AND ANAEROBIC BOTTLES CRITICAL RESULT CALLED TO, READ BACK BY AND VERIFIED WITH: TREY GREENWOOD 03/28/24 0928 MW    Culture (A)  Final    STAPHYLOCOCCUS EPIDERMIDIS THE SIGNIFICANCE OF ISOLATING THIS ORGANISM FROM A SINGLE SET OF BLOOD CULTURES WHEN MULTIPLE SETS ARE DRAWN IS UNCERTAIN. PLEASE NOTIFY THE MICROBIOLOGY DEPARTMENT WITHIN ONE WEEK IF SPECIATION AND SENSITIVITIES ARE REQUIRED. Performed at [facility], [address] [place], [place] [address]    Report Status 03/30/2024 FINAL  Final  Blood Culture ID Panel (Reflexed)     Status: Abnormal   Collection Time: 03/27/24 12:10 PM  Result Value Ref Range Status   Enterococcus faecalis NOT DETECTED NOT DETECTED Final   Enterococcus Faecium NOT DETECTED NOT DETECTED Final   Listeria monocytogenes NOT DETECTED NOT DETECTED Final   Staphylococcus species DETECTED (A) NOT DETECTED Final    Comment: CRITICAL RESULT CALLED TO, READ BACK BY AND VERIFIED WITH: TREY GREENWOOD 03/28/24 0928 MW    Staphylococcus aureus (BCID) NOT DETECTED NOT DETECTED Final   Staphylococcus epidermidis DETECTED (A) NOT DETECTED Final    Comment: Methicillin (oxacillin) resistant coagulase negative staphylococcus. Possible blood culture contaminant (unless isolated from more than one blood culture draw or clinical case suggests pathogenicity). No antibiotic treatment is indicated for blood  culture contaminants. CRITICAL RESULT CALLED TO, READ BACK BY AND VERIFIED WITH: TREY GREENWOOD 03/28/24 0928 MW    Staphylococcus lugdunensis NOT DETECTED NOT DETECTED Final   Streptococcus species NOT DETECTED NOT DETECTED Final   Streptococcus agalactiae NOT DETECTED NOT DETECTED Final   Streptococcus pneumoniae NOT DETECTED NOT DETECTED Final    Streptococcus pyogenes NOT DETECTED NOT DETECTED Final   A.calcoaceticus-baumannii NOT DETECTED  NOT DETECTED Final   Bacteroides fragilis NOT DETECTED NOT DETECTED Final   Enterobacterales NOT DETECTED NOT DETECTED Final   Enterobacter cloacae complex NOT DETECTED NOT DETECTED Final   Escherichia coli NOT DETECTED NOT DETECTED Final   Klebsiella aerogenes NOT DETECTED NOT DETECTED Final   Klebsiella oxytoca NOT DETECTED NOT DETECTED Final   Klebsiella pneumoniae NOT DETECTED NOT DETECTED Final   Proteus species NOT DETECTED NOT DETECTED Final   Salmonella species NOT DETECTED NOT DETECTED Final   Serratia marcescens NOT DETECTED NOT DETECTED Final   Haemophilus influenzae NOT DETECTED NOT DETECTED Final   Neisseria meningitidis NOT DETECTED NOT DETECTED Final   Pseudomonas aeruginosa NOT DETECTED NOT DETECTED Final   Stenotrophomonas maltophilia NOT DETECTED NOT DETECTED Final   Candida albicans NOT DETECTED NOT DETECTED Final   Candida auris NOT DETECTED NOT DETECTED Final   Candida glabrata NOT DETECTED NOT DETECTED Final   Candida krusei NOT DETECTED NOT DETECTED Final   Candida parapsilosis NOT DETECTED NOT DETECTED Final   Candida tropicalis NOT DETECTED NOT DETECTED Final   Cryptococcus neoformans/gattii NOT DETECTED NOT DETECTED Final   Methicillin resistance mecA/C DETECTED (A) NOT DETECTED Final    Comment: CRITICAL RESULT CALLED TO, READ BACK BY AND VERIFIED WITH: MOSE BLEW 03/28/24 9071 MW Performed at [facility], [address] [place], [place] [address]   C Difficile Quick Screen (NO PCR Reflex)     Status: None   Collection Time: 03/28/24  9:15 AM   Specimen: STOOL  Result Value Ref Range Status   C Diff antigen NEGATIVE NEGATIVE Final   C Diff toxin NEGATIVE NEGATIVE Final   C Diff interpretation No C. difficile detected.  Final    Comment: Performed at [facility], [address] [place], [place] [address]  Culture, blood (Routine X 2)  w Reflex to ID Panel     Status: None (Preliminary result)   Collection Time: 03/28/24 10:46 AM   Specimen: BLOOD RIGHT HAND  Result Value Ref Range Status   Specimen Description BLOOD RIGHT HAND  Final   Special Requests   Final    BOTTLES DRAWN AEROBIC AND ANAEROBIC Blood Culture adequate volume   Culture   Final    NO GROWTH 3 DAYS Performed at [facility], [address] [place], [place] [address]    Report Status PENDING  Incomplete  Culture, blood (Routine X 2) w Reflex to ID Panel     Status: None (Preliminary result)   Collection Time: 03/28/24 10:46 AM   Specimen: BLOOD RIGHT ARM  Result Value Ref Range Status   Specimen Description BLOOD RIGHT ARM  Final   Special Requests   Final    BOTTLES DRAWN AEROBIC AND ANAEROBIC Blood Culture adequate volume   Culture   Final    NO GROWTH 3 DAYS Performed at [facility], [address], [place], [place] [address]    Report Status PENDING  Incomplete     Studies/Results: US  Venous Img Lower Bilateral (DVT) Result Date: 03/30/2024 CLINICAL DATA:  10027 Tachypnea 10027 [id number] Fever [id number] EXAM: BILATERAL LOWER EXTREMITY VENOUS DOPPLER ULTRASOUND TECHNIQUE: Gray-scale sonography with graded compression, as well as color Doppler and duplex ultrasound were performed to evaluate the lower extremity deep venous systems from the level of the common femoral vein and including the common femoral, femoral, profunda femoral, popliteal and calf veins including the posterior tibial, peroneal and gastrocnemius veins when visible. The superficial great saphenous vein was also interrogated.  Spectral Doppler was utilized to evaluate flow at rest and with distal augmentation maneuvers in the common femoral, femoral and popliteal veins. COMPARISON:  None Available. FINDINGS: RIGHT LOWER EXTREMITY Common Femoral Vein: No evidence of thrombus. Normal compressibility, respiratory phasicity and response to augmentation. Saphenofemoral  Junction: No evidence of thrombus. Normal compressibility and flow on color Doppler imaging. Profunda Femoral Vein: No evidence of thrombus. Normal compressibility and flow on color Doppler imaging. Femoral Vein: No evidence of thrombus. Normal compressibility, respiratory phasicity and response to augmentation. Popliteal Vein: No evidence of thrombus. Normal compressibility, respiratory phasicity and response to augmentation. Calf Veins: No evidence of thrombus. Normal compressibility and flow on color Doppler imaging. Superficial Great Saphenous Vein: No evidence of thrombus. Normal compressibility. Other Findings:  None. LEFT LOWER EXTREMITY Common Femoral Vein: No evidence of thrombus. Normal compressibility, respiratory phasicity and response to augmentation. Saphenofemoral Junction: No evidence of thrombus. Normal compressibility and flow on color Doppler imaging. Profunda Femoral Vein: No evidence of thrombus. Normal compressibility and flow on color Doppler imaging. Femoral Vein: No evidence of thrombus. Normal compressibility, respiratory phasicity and response to augmentation. Popliteal Vein: No evidence of thrombus. Normal compressibility, respiratory phasicity and response to augmentation. Calf Veins: No evidence of thrombus. Normal compressibility and flow on color Doppler imaging. Superficial Great Saphenous Vein: No evidence of thrombus. Normal compressibility. Other Findings:  None. IMPRESSION: Negative for deep venous thrombosis within both legs. Electronically Signed   By: Rogelia Myers M.D.   On: 03/30/2024 22:21   CT HEAD WO CONTRAST ( ) Result Date: 03/30/2024 EXAM: CT HEAD WITHOUT CONTRAST 03/30/2024 05:17:31 PM TECHNIQUE: CT of the head was performed without the administration of intravenous contrast. Automated exposure control, iterative reconstruction, and/or weight based adjustment of the mA/kV was utilized to reduce the radiation dose to as low as reasonably achievable. COMPARISON:  03/17/2024. CLINICAL HISTORY: Rule out increased intracranial pressure. FINDINGS: BRAIN AND VENTRICLES: No acute hemorrhage. 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 Chronic small vessel ischemic changes of the white matter. Chronic lacunar infarct right thalamus. No hydrocephalus. No extra-axial collection. No mass effect or midline shift. ORBITS: No acute abnormality. SINUSES: No acute abnormality. SOFT TISSUES AND SKULL: No acute soft tissue abnormality. No skull fracture. Carotid vascular calcification. IMPRESSION: 1. 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 No hemorrhagic transformation or mass effect. 2. Chronic small vessel ischemic changes of the white matter and chronic lacunar infarct right thalamus. Electronically signed by: Franky Stanford MD 03/30/2024 07:57 PM EST RP Workstation: [id number]    Progressed b/l ACA territory infarcts with cytotoxic edema left > rt   Assessment/Plan: 78 yr female in the hospital since 11/21 Extensive CVA  b/l ACA involvement minimally responsive.  Poor prognosis. Multiple  infarcts in other areas   Fever-as yet no obvious etiology.  Could be from aspiration pneumonitis, Central fever, med fever, dvt. Occult abscess.  cdiff neg  No vegetations on the valves on 2 d echo 11/22 buttock wound-clean not infected   UA neg No evidence joint infection No obvious SSTI - wounds on sacrum Neg LE doppler  Rec If CT chest abd pelvis neg then can stop abx and likely central fever   Staph epi bactremia 1/2 bcx. FU 12/2 ngtd - started vacnoc 12/2- if fu bcx neg can dc Stopped vanco as fu neg and was not on vanco when done    H/o back pain- may consider imaging. However if has CT abd pelvis done and neg then unlikely but can monuitor

## 2024-03-31 NOTE — Plan of Care (Signed)

## 2024-03-31 NOTE — TOC Progression Note (Addendum)
 Transition of Care [facility]) - Progression Note    Patient Details  Name: Marie Pham MRN: [id number] Date of Birth: [birth year]  Transition of Care [facility]) CM/SW Contact  Lauraine JAYSON Carpen, LCSW Phone Number: 03/31/2024, 11:06 AM  Clinical Narrative:   Left voicemail for Clotilda at [facility] to see if they had a preferred provider for outpatient palliative services.  12:06 pm: Received call back from [facility]. They had a call with daughter and [facility] to arrange outpatient palliative services at discharge. Sent secure chat to [facility] liaison at [facility] to notify. Clotilda does not have access to patient's chart so CSW faxed most recent dietician note so she can see what tube feeds patient will need.  4:18 pm: Faxed updated FL2 and today's dietician note to [facility].  Expected Discharge Plan: Skilled Nursing Facility Barriers to Discharge: Continued Medical Work up               Expected Discharge Plan and Services     Post Acute Care Choice: Skilled Nursing Facility Living arrangements for the past 2 months: Single Family Home                                       Social Drivers of Health (SDOH) Interventions SDOH Screenings   Food Insecurity: Patient Unable To Answer (03/18/2024)  Housing: Patient Unable To Answer (03/18/2024)  Transportation Needs: Patient Unable To Answer (03/18/2024)  Utilities: Patient Unable To Answer (03/18/2024)  Financial Resource Strain: High Risk (09/01/2023)   Received from [facility]  Social Connections: Patient Unable To Answer (03/18/2024)  Tobacco Use: Medium Risk (03/17/2024)    Readmission Risk Interventions     No data to display

## 2024-03-31 NOTE — NC FL2 (Signed)
 [place]  MEDICAID FL2 LEVEL OF CARE FORM     IDENTIFICATION  Patient Name: Marie Pham Birthdate: 16-Jan-1946 Sex: female Admission Date (Current Location): 03/17/2024  [facility] and [place] Number:  Chiropodist and Address:  [facility], [address] [place], [place] [address]      Provider Number: [id number]  Attending Physician Name and Address:  Mosie Ford, MD  Relative Name and Phone Number:       Current Level of Care: Hospital Recommended Level of Care: Skilled Nursing Facility (with outpatient palliative through [facility]) Prior Approval Number:    Date Approved/Denied:   PASRR Number: [id number]  Discharge Plan: SNF (with outpatient palliative through [facility])    Current Diagnoses: Patient Active Problem List   Diagnosis Date Noted   Fever 03/28/2024   Severe aortic stenosis 03/19/2024   Pneumonitis 03/19/2024   AKI (acute kidney injury) 03/18/2024   Hypernatremia 03/18/2024   Abnormal EKG 03/18/2024   SIRS (systemic inflammatory response syndrome) (HCC) 03/18/2024   Acute CVA (cerebrovascular accident) (HCC) 03/17/2024   BP (high blood pressure) 10/02/2014   Asthma, chronic 10/02/2014   Essential hypertension 10/02/2014   Obesity 10/02/2014   Sleep apnea 10/02/2014    Orientation RESPIRATION BLADDER Height & Weight      (Disoriented x 4)  O2 (Nasal Cannula 2 L) Incontinent, External catheter Weight:  (unable to get weight on patient) Height:  5' 5 (165.1 cm)  BEHAVIORAL SYMPTOMS/MOOD NEUROLOGICAL BOWEL NUTRITION STATUS   (None)  (None) Incontinent Feeding tube  AMBULATORY STATUS COMMUNICATION OF NEEDS Skin   Total Care Non-Verbally Other (Comment) (Deep tissue injury on sacrum: Foam.)                       Personal Care Assistance Level of Assistance  Total care       Total Care Assistance: Maximum assistance   Functional Limitations Info  Sight, Hearing, Speech Sight Info:  Adequate Hearing Info: Adequate Speech Info: Impaired (Expressive aphasia)    SPECIAL CARE FACTORS FREQUENCY                   Contractures Contractures Info: Not present    Additional Factors Info  Code Status, Allergies Code Status Info: DNR Allergies Info: Lansoprazole, Penicillins, Prozac (Fluoxteine), Bupropion           Current Medications (03/31/2024):  This is the current hospital active medication list Current Facility-Administered Medications  Medication Dose Route Frequency Provider Last Rate Last Admin   acetaminophen  (TYLENOL ) tablet 650 mg  650 mg Per Tube Q4H PRN Lenon Elsie HERO, RPH       Or   acetaminophen  (TYLENOL ) 160 MG/5ML solution 650 mg  650 mg Per Tube Q4H PRN Lenon Elsie HERO, RPH   650 mg at 03/30/24 1805   Or   acetaminophen  (TYLENOL ) suppository 650 mg  650 mg Rectal Q4H PRN Lenon Elsie HERO, RPH       ascorbic acid  (VITAMIN C ) tablet 500 mg  500 mg Per Tube BID Amin, Sumayya, MD   500 mg at 03/31/24 9145   aspirin  chewable tablet 81 mg  81 mg Per NG tube Daily Jens Durand, MD   81 mg at 03/31/24 0854   atorvastatin  (LIPITOR ) tablet 80 mg  80 mg Per NG tube Daily Jens Durand, MD   80 mg at 03/31/24 9145   clopidogrel  (PLAVIX ) tablet 75 mg  75 mg Per Tube Daily Al-Sultani, Anmar, MD   75  mg at 03/31/24 0854   collagenase  (SANTYL ) ointment   Topical Daily Al-Sultani, Anmar, MD   Given at 03/31/24 0854   enoxaparin  (LOVENOX ) injection 40 mg  40 mg Subcutaneous Q24H Al-Sultani, Anmar, MD   40 mg at 03/31/24 1249   feeding supplement (OSMOLITE 1.5 CAL) liquid 1,000 mL  1,000 mL Per Tube Continuous Al-Sultani, Anmar, MD 50 mL/hr at 03/30/24 2100 1,000 mL at 03/30/24 2100   feeding supplement (PROSource TF20) liquid 60 mL  60 mL Per Tube Daily Al-Sultani, Anmar, MD   60 mL at 03/31/24 0854   fiber supplement (BANATROL TF) liquid 60 mL  60 mL Per Tube BID Al-Sultani, Anmar, MD   60 mL at 03/31/24 0854   free water  250 mL  250 mL Per Tube  Q4H Al-Sultani, Anmar, MD   250 mL at 03/31/24 1227   ibuprofen  (ADVIL ) 100 MG/5ML suspension 400 mg  400 mg Per Tube Q8H PRN Amin, Sumayya, MD   400 mg at 03/30/24 1225   insulin  aspart (novoLOG ) injection 0-15 Units  0-15 Units Subcutaneous Q4H Amin, Sumayya, MD   3 Units at 03/31/24 1250   ipratropium-albuterol  (DUONEB) 0.5-2.5 (3) MG/3ML nebulizer solution 3 mL  3 mL Nebulization Q4H PRN Mansy, Jan A, MD   3 mL at 03/29/24 0125   latanoprost  (XALATAN ) 0.005 % ophthalmic solution 1 drop  1 drop Both Eyes QHS Amin, Sumayya, MD   1 drop at 03/30/24 2109   lip balm (CARMEX) ointment 1 Application  1 Application Topical PRN Amin, Sumayya, MD   1 Application at 03/27/24 2156   losartan  (COZAAR ) tablet 25 mg  25 mg Per Tube Daily Amin, Sumayya, MD   25 mg at 03/31/24 0854   meropenem  (MERREM ) 1 g in sodium chloride  0.9 % 100 mL IVPB  1 g Intravenous Q8H Tobie Myron M, RPH 200 mL/hr at 03/31/24 1247 1 g at 03/31/24 1247   morphine  (PF) 2 MG/ML injection 1-2 mg  1-2 mg Intravenous Q4H PRN Mansy, Jan A, MD   2 mg at 03/29/24 0125   multivitamin with minerals tablet 1 tablet  1 tablet Per Tube Daily Jens Durand, MD   1 tablet at 03/31/24 9145   nutrition supplement (JUVEN) (JUVEN) powder packet 1 packet  1 packet Per Tube BID BM Al-Sultani, Anmar, MD   1 packet at 03/31/24 0854   Oral care mouth rinse  15 mL Mouth Rinse 4 times per day Amin, Sumayya, MD   15 mL at 03/31/24 1228   Oral care mouth rinse  15 mL Mouth Rinse PRN Caleen Qualia, MD       propranolol  (INDERAL ) tablet 10 mg  10 mg Per Tube TID Matthews Elida HERO, MD   10 mg at 03/31/24 9146   zinc  sulfate (50mg  elemental zinc ) capsule 220 mg  220 mg Per Tube Daily Amin, Sumayya, MD   220 mg at 03/31/24 9145     Discharge Medications: Please see discharge summary for a list of discharge medications.  Relevant Imaging Results:  Relevant Lab Results:   Additional Information SS#: [id number]. Will need an air mattress.  Lauraine JAYSON Carpen,  LCSW

## 2024-04-01 DIAGNOSIS — I639 Cerebral infarction, unspecified: Secondary | ICD-10-CM | POA: Diagnosis not present

## 2024-04-01 LAB — CULTURE, BLOOD (ROUTINE X 2)
Culture: NO GROWTH
Special Requests: ADEQUATE

## 2024-04-01 LAB — GLUCOSE, CAPILLARY
Glucose-Capillary: 155 mg/dL — ABNORMAL HIGH (ref 70–99)
Glucose-Capillary: 189 mg/dL — ABNORMAL HIGH (ref 70–99)
Glucose-Capillary: 152 mg/dL — ABNORMAL HIGH (ref 70–99)
Glucose-Capillary: 191 mg/dL — ABNORMAL HIGH (ref 70–99)
Glucose-Capillary: 185 mg/dL — ABNORMAL HIGH (ref 70–99)

## 2024-04-01 LAB — BASIC METABOLIC PANEL WITH GFR
Anion gap: 4 — ABNORMAL LOW (ref 5–15)
Anion gap: 5 (ref 5–15)
BUN: 39 mg/dL — ABNORMAL HIGH (ref 8–23)
BUN: 40 mg/dL — ABNORMAL HIGH (ref 8–23)
CO2: 34 mmol/L — ABNORMAL HIGH (ref 22–32)
CO2: 35 mmol/L — ABNORMAL HIGH (ref 22–32)
Calcium: 9 mg/dL (ref 8.9–10.3)
Calcium: 9.2 mg/dL (ref 8.9–10.3)
Chloride: 109 mmol/L (ref 98–111)
Chloride: 108 mmol/L (ref 98–111)
Creatinine, Ser: 0.62 mg/dL (ref 0.44–1.00)
Creatinine, Ser: 0.51 mg/dL (ref 0.44–1.00)
GFR, Estimated: >60 mL/min (ref >60)
GFR, Estimated: >60 mL/min (ref >60)
Glucose, Bld: 168 mg/dL — ABNORMAL HIGH (ref 70–99)
Glucose, Bld: 155 mg/dL — ABNORMAL HIGH (ref 70–99)
Potassium: 5.2 mmol/L — ABNORMAL HIGH (ref 3.5–5.1)
Potassium: 4.9 mmol/L (ref 3.5–5.1)
Sodium: 147 mmol/L — ABNORMAL HIGH (ref 135–145)
Sodium: 148 mmol/L — ABNORMAL HIGH (ref 135–145)

## 2024-04-01 LAB — CBC
HCT: 36.2 % (ref 36.0–46.0)
Hemoglobin: 11.1 g/dL — ABNORMAL LOW (ref 12.0–15.0)
MCH: 28.9 pg (ref 26.0–34.0)
MCHC: 30.7 g/dL (ref 30.0–36.0)
MCV: 94.3 fL (ref 80.0–100.0)
Platelets: 305 K/uL (ref 150–400)
RBC: 3.84 MIL/uL — ABNORMAL LOW (ref 3.87–5.11)
RDW: 13.8 % (ref 11.5–15.5)
WBC: 13.9 K/uL — ABNORMAL HIGH (ref 4.0–10.5)
nRBC: 0 % (ref 0.0–0.2)

## 2024-04-01 MED ORDER — OSMOLITE 1.2 CAL PO LIQD
1000.0000 mL | ORAL | Status: DC
  Administered 2024-04-01 – 2024-04-04 (×4): 1000 mL

## 2024-04-01 MED ORDER — SODIUM ZIRCONIUM CYCLOSILICATE 10 G PO PACK
10.0000 g | PACK | Freq: Once | ORAL | Status: AC
  Administered 2024-04-01: 10 g
  Filled 2024-04-01: qty 1

## 2024-04-01 NOTE — Plan of Care (Signed)
 Neurology plan of care  D/w Dr. Mosie, patients fevers abated after starting propranolol  for neuro storming. Patient is being prepared for discharge likely in the next few days. OK to continue propranolol  at current dose at discharge. Neurology will be available prn for any questions going forward.  Elida Ross, MD [facility] [phone]  If 7pm- 7am, please page neurology on call as listed in AMION.

## 2024-04-01 NOTE — TOC Progression Note (Signed)
 Transition of Care [facility]) - Progression Note    Patient Details  Name: Marie Pham MRN: [id number] Date of Birth: 19-Jul-1945  Transition of [facility]) CM/SW Contact  Victory Jackquline RAMAN, RN Phone Number: 04/01/2024, 1:32 PM  Clinical Narrative:    RNCM spoke to the MD and he asked if [facility] would accept the patient today or tomorrow. RNCM called Whitney SW with [facility] @ [phone]. RNCM introduced role and explained that discharge planning would be discussed. She said that they weren't aware that the patient was ready for discharge and that they had just ordered a mattress and feeding tube pump for the patient and it hasn't been delivered yet. She will call me tomorrow if they get it between now and then. She can be transferred there as soon as they have supplies. RNCM will continue to follow for discharge planning needs.    Expected Discharge Plan: Skilled Nursing Facility Barriers to Discharge: Continued Medical Work up               Expected Discharge Plan and Services     Post Acute Care Choice: Skilled Nursing Facility Living arrangements for the past 2 months: Single Family Home                                       Social Drivers of Health (SDOH) Interventions SDOH Screenings   Food Insecurity: Patient Unable To Answer (03/18/2024)  Housing: Patient Unable To Answer (03/18/2024)  Transportation Needs: Patient Unable To Answer (03/18/2024)  Utilities: Patient Unable To Answer (03/18/2024)  Financial Resource Strain: High Risk (09/01/2023)   Received from [facility]  Social Connections: Patient Unable To Answer (03/18/2024)  Tobacco Use: Medium Risk (03/17/2024)    Readmission Risk Interventions     No data to display

## 2024-04-01 NOTE — Progress Notes (Signed)
 Nutrition Follow-up  DOCUMENTATION CODES:   Obesity unspecified  INTERVENTION:   TF via PEG:  Change to Osmolite 1.2 at 60 ml/h Continue Prosource TF20 60 ml once daily Free water  flush 300 ml every 4 hours per MD Provides 1808 kcal, 100 grams of protein, and 1181 ml of free water . Total free water : 2981 ml daily    Discontinue Banatrol  Continue MVI with minerals daily via tube  Continue 500 mg vitamin C  BID via tube  Continue 220 mg zinc  sulfate daily x 14 days via tube  Continue 1 packet Juven BID via tube, each packet provides 95 calories, 2.5 grams of protein (collagen), and 9.8 grams of carbohydrate (3 grams sugar); also contains 7 grams of L-arginine and L-glutamine, 300 mg vitamin C , 15 mg vitamin E, 1.2 mcg vitamin B-12, 9.5 mg zinc , 200 mg calcium , and 1.5 g  Calcium  Beta-hydroxy-Beta-methylbutyrate to support wound healing   NUTRITION DIAGNOSIS:   Inadequate oral intake related to inability to eat, dysphagia as evidenced by NPO status.  Ongoing  GOAL:   Patient will meet greater than or equal to 90% of their needs  Met with TF  MONITOR:   Diet advancement, TF tolerance  REASON FOR ASSESSMENT:   Consult Wound healing  ASSESSMENT:   78 y.o. female with medical history significant for Hypertension, arthritis, chronic back pain, ambulance at baseline with a walker being admitted for an acute CVA.  Notified by MD that patient's sodium level remains elevated even with increase in free water  flushes. Will change to a less concentrated formula to provide additional free water .   Also, her K was elevated this morning. Patient continues to have loose stools. Rectal tube in place. Receiving banatrol, but it does not seem to be helping, will discontinue. Banatrol contains a small amount of potassium, so this may help with elevated K level.   Current TF orders: Osmolite 1.5 at 50 ml/h with Prosource TF20 60 ml once daily via PEG. Free water  flushes 300 ml  q4h.  Labs reviewed.  Na 148 K 5.2-->4.9 after 10 g of Lokelma  CBG: [id number]  Medications reviewed and include vitamin C , banatrol TF, novolog , MVI, juven, zinc  sulfate.  Diet Order:   Diet Order             Diet NPO time specified  Diet effective midnight                   EDUCATION NEEDS:   Education needs have been addressed  Skin:  Skin Assessment: Skin Integrity Issues: Skin Integrity Issues:: DTI DTI: sacrum and bilateral buttocks; dark discoloration evolving with red moist areas to buttocks and linear red moist area coccyx  Last BM:  12/6 rectal tube  Height:   Ht Readings from Last 1 Encounters:  03/29/24 5' 5 (1.651 m)    Weight:   Wt Readings from Last 1 Encounters:  03/30/24 85.2 kg    Ideal Body Weight:  56.8 kg  BMI:  Body mass index is 31.26 kg/m.  Estimated Nutritional Needs:   Kcal:  1800-2000  Protein:  95-110 grams  Fluid:  1.8-2.0 L   Suzen HUNT RD, LDN, CNSC Contact via secure chat. If unavailable, use group chat RD Inpatient.

## 2024-04-01 NOTE — Progress Notes (Signed)
 PROGRESS NOTE    ETSUKO DIEROLF  FMW:[id number] DOB: [birth year] DOA: 03/17/2024 PCP: Tobie Domino, MD    Brief Narrative:  SHAMRA BRADEEN is a 78 y.o. female with PMHx of HTN, chronic back pain, arthritis, OSA, depression, asthma/bronchitis, and baseline limited mobility (ambulates with walker) who presented with poor oral intake and new speech difficulty.  Family reported she appeared to understand but was unable to respond verbally.  Last well-known was >24 hours before arrival.  In ED, she was afebrile with a temp of 98.7 F, RR 18, HR 115, BP 120/99, SpO2 95% on RA, placed on 2 L O2 for comfort.  Initial labs showed mild polycythemia, hyponatremia (NA 147), Troponins were negative, mildly elevated D-dimer to 0.92.  EKG showed sinus tachycardia, prolonged QTc to 494 ms. CTA head/neck demonstrated an acute left basal ganglia infarct.  She received an NS bolus, Cardizem , and aspirin  suppository.  She was admitted for stroke management.  MRI (11/22) confirmed bilateral ACA territory infarcts with cytotoxic edema (L >R).  TTE (11/22) showed LVEF 60-65%, G3DD, no atrial level shunt detected by color-flow Doppler. She failed swallow eval and underwent dobhoff feeding tube placemenet by IR on 11/24. Shortly after admission she became progressively more somnolent and then minimally responsive.  EEG (11/26) showed generalized cortical dysfunction without seizures. Repeat MRI (11/27) showed further evolution of the bilateral ACA strokes without hemorrhagic transformation.  Neurology was consulted and advised that prognosis of meaningful neurologic recovery is extremely poor. She underwent PEG tube placement on 12/3.   Her course was complicated by recurrent fevers and persistent tachypnea.  There was concern for aspiration on 11/27 and she was started on Rocephin  and Flagyl .  Fevers recurred, prompting ID involvement.  CXR repeated and demonstrated low lung volumes and bronchovascular crowding without focal  consolidation.  1 of 2 blood cultures (12/1) was positive for MRSE (likely contaminant), and repeat cultures were sent (12/2) prior to antibiotic change which showed no growth.  Antibiotics were escalated to cefepime  plus vancomycin , later changed to meropenem  due to concern for cefepime  worsening encephalopathy.  C. difficile PCR remained negative.  Given extensive bifrontal injury and cytotoxic edema, ID felt some fevers were likely central.   Neurologically, she has remained severely encephalopathic.  She intermittently opens her eyes to voice, and per daughter had occasional spontaneous eye opening and brief tracking, but no sustained purposeful responsiveness.  Prognosis remains extremely guarded.   12/6 - no fevers, tachycardia improved, tachypnea persistent. WBC improving. Meropenem  discontinued given negative CT chest/abd/pelvis. Hyperkalemia to 5.2, resolved with Lokelma  to 4.9 on repeat.   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 - PEG placed 12/3, internal nutrition per PEG started per RD - Continue aspirin , Lipitor , Plavix  (resumed 1 day after PEG placement) - PT OT evaluated, recommended SNF - Palliative care following - Neurology reengaged - recommended CT head noncon to rule out increased ICP - CT head noncon showed evolving bilateral ACA infarcts without hemorrhaging transformation or mass effect, chronic small vessel ischemic white matter changes and lacunar infarct of right thalamus  Recurrent fevers  SIRS Possible aspiration pneumonitis +/- central fever / sympathetic  hyperactivity - CTA chest (11/22) with bilateral pneumonitis likely  aspiration related, no PE - Treated initially with Rocephin  and azithromycin , later Rocephin  and Flagyl  after concerns for aspiration overnight on 11/27 - Had recurrent fevers > 101 F on 12/1 with leukocytosis, blood cultures were obtained - CXR without focal infectious source - ID was consulted - Blood cultures (12/1) 1 of 2 positive for MRSE, likely contaminant.  Blood cultures repeated on 12/2 prior to starting vancomycin  - Antibiotics escalated to cefepime  plus vancomycin , later revised to meropenem  per ID on 12/2 - C. Diff PCR negative - Blood cultures (12/2) NGTD - UA (12/3) negative  - On 12/4. continued to be febrile with Tmax 102.9 F and leukocytosis to 18.1 - Neurology reengaged due to concern for central fever, recommended scheduled Tylenol , propranolol  10 mg TID, gradual taper off Coreg , cooling blankets. Recommended reconsult if no improvement in fevers in 3 days.  - ID recommended obtaining LE doppler to rule out DVT, possibly followed by CT c/a/p if negative - LE doppler negative for DVTs bilaterally - Vancomycin  discontinued on 12/4 - CT chest/abd/pelvis with contrast unrevealing of source of infection - Meropenem  discontinued on 12/5 - Afebrile thus far today, leukocytosis improving to 13.9  Tachypnea  Tachycardia - Etiology unclear, possibly related to central fevers and possible sympathetic hyperactivity/storming - Propranalol 10 mg TID started per neurology's recommendation  - Tachycardia improving to 94-96 - Tachypnea persistent with rapid shallow breaths 21-37  Dysphagia secondary to CVA - PEG tube placed on 12/3 - RD following for continuation of tube feeds  Hypernatremia 2/2 free water  deficit - Na continues to be elevated, likely increased losses from tachypnea/fevers - Discussed with RD, free water  flushes adjusted - Continue to monitor Na levels  Severe aortic stenosis  grade 3  diastolic dysfunction - Stable hemodynamics - Will need outpatient follow-up with cardiologist if feasible  Hypertension - SBP in 140-150s - Propranolol  started per neurology for possible central fevers / sympathetic hyperactivity - Continue propranolol  10 mg TID   Chronic back pain/lumbar radiculitis - Supportive management - Currently patient is unresponsive and unable to indicate pain level   Hyperglycemia - Hgb A1c 6.1 (11/22) - Continue to monitor given tube feeds - SSI    Sacral pressure wound   - Air mattress in place - Change position every 2 hour - Wound care consulted and following  Goals of care - Family wishes to continue full scope of care and observe for any neurologic improvement over the coming months before reconsidering hospice - Will need referral to outpatient palliative care through [facility] on discharge - Prognosis remains extremely guarded  DVT prophylaxis: enoxaparin  (LOVENOX ) injection 40 mg Start: 03/30/24 1100 Place and maintain sequential compression device Start: 03/28/24 1531   Code Status:   Code Status: Do not attempt resuscitation (DNR) PRE-ARREST INTERVENTIONS DESIRED  Family Communication: Discussed patient's current plan of care and extremely guarded prognosis with patient's daughter at bedside.   Disposition Plan: Medically ready for discharge. Discharge to SNF when they have secured an air mattress and TF pump  PT - Follow Up Recommendations: Long-term institutional care without follow-up therapy OT - Follow Up Recommendations: Long-term institutional care without follow-up therapy  DME Needs: PT equipment: Other (comment) (ongoing assessment due to lack of progress)    Level of care: Progressive  Consultants:  Neurology, infectious disease  Procedures:  IR Dobhoff placement - 11/24 IR PEG tube placement - 12/3  Antimicrobials: Vancomycin  and meropenem  - discontinued   Subjective: Patient evaluated at  bedside in the presence  of patient's daughter. Remains largely unresponsive. Very limited and brief eye opening to verbal and noxious stimuli. Unable to follow commands  Objective: Vitals:   04/01/24 0933 04/01/24 1000 04/01/24 1120 04/01/24 1305  BP: (!) 113/53   (!) 133/57  Pulse: 96     Resp:   (S) (!) 37   Temp:      TempSrc:      SpO2:  98% 97%   Weight:      Height:        Intake/Output Summary (Last 24 hours) at 04/01/2024 1415 Last data filed at 04/01/2024 1200 Gross per 24 hour  Intake 2653.5 ml  Output 1600 ml  Net 1053.5 ml   Filed Weights   03/29/24 1500 03/30/24 0500  Weight: 85.8 kg 85.2 kg    Examination:  Physical Exam Constitutional:      Appearance: She is ill-appearing.     Interventions: Nasal cannula in place.  HENT:     Mouth/Throat:     Mouth: Mucous membranes are dry.  Eyes:     Comments: Pupils equal, constricted, very minimally reactive, no purposeful eye movement, does not track. Very brief and limited opening of eyes to noxious stimuli today.  Cardiovascular:     Rate and Rhythm: Normal rate and regular rhythm.     Heart sounds: Murmur heard.     Systolic murmur is present.  Pulmonary:     Effort: Pulmonary effort is normal. Tachypnea present. No respiratory distress.     Comments: Unable to auscultate posterior lung fields. Breaths are rapid and shallow.  Abdominal:     General: There is no distension.     Palpations: Abdomen is soft.     Tenderness: There is no abdominal tenderness. There is no guarding.     Comments: PEG tube in place, dressing c/d/i   Genitourinary:    Comments: Purewick in place. FMS in place.  Musculoskeletal:     Right lower leg: No edema.     Left lower leg: No edema.  Skin:    General: Skin is warm and dry.     Capillary Refill: Capillary refill takes less than 2 seconds.  Neurological:     Mental Status: She is unresponsive.     Data Reviewed: I have personally reviewed following labs and imaging  studies  CBC: Recent Labs  Lab 03/27/24 1210 03/28/24 0513 03/29/24 0449 03/30/24 0425 03/31/24 0434 04/01/24 0621  WBC 13.2* 15.0* 17.6* 18.1* 15.5* 13.9*  NEUTROABS 8.7* 10.6* 13.5*  --   --   --   HGB 11.9* 12.2 12.3 12.6 11.8* 11.1*  HCT 37.6 38.5 38.5 39.8 38.4 36.2  MCV 89.7 90.6 90.4 90.7 93.2 94.3  PLT 254 265 296 322 314 305   Basic Metabolic Panel: Recent Labs  Lab 03/26/24 0519 03/27/24 1210 03/28/24 0513 03/29/24 0449 03/30/24 0425 03/31/24 0434 04/01/24 0621 04/01/24 1225  NA 148* 146* 145 147* 148*  148* 149* 147* 148*  K 4.4 4.5 4.8 4.5 4.9  4.8 4.6 5.2* 4.9  CL 109 108 107 107 110  109 112* 109 108  CO2 32 31 29 32 31  31 32 34* 35*  GLUCOSE 201* 231* 227* 111* 162*  164* 179* 168* 155*  BUN 28* 26* 21 22 29*  29* 33* 39* 40*  CREATININE 0.73 0.73 0.68 0.65 0.74  0.74 0.57 0.62 0.51  CALCIUM  8.9 8.7* 8.9 9.1 9.1  9.1 8.8* 9.0 9.2  PHOS 3.2 4.0 3.8 4.3 3.3  --   --   --  GFR: Estimated Creatinine Clearance: 65.5 mL/min (by C-G formula based on SCr of 0.51 mg/dL). Liver Function Tests: Recent Labs  Lab 03/26/24 0519 03/27/24 1210 03/28/24 0513 03/29/24 0449 03/30/24 0425  AST  --   --  39  --   --   ALT  --   --  33  --   --   ALKPHOS  --   --  81  --   --   BILITOT  --   --  0.2  --   --   PROT  --   --  6.6  --   --   ALBUMIN 3.1* 3.1* 3.2*  3.2* 3.2* 3.3*   No results for input(s): LIPASE, AMYLASE in the last 168 hours. No results for input(s): AMMONIA in the last 168 hours.  Coagulation Profile: No results for input(s): INR, PROTIME in the last 168 hours. Cardiac Enzymes: Recent Labs  Lab 03/29/24 0449  CKTOTAL 139   BNP (last 3 results) Recent Labs    03/29/24 0449  PROBNP 286.0   HbA1C: No results for input(s): HGBA1C in the last 72 hours. CBG: Recent Labs  Lab 03/31/24 1945 03/31/24 2300 04/01/24 0404 04/01/24 0914 04/01/24 1304  GLUCAP 223* 176* 155* 189* 152*   Lipid Profile: No results  for input(s): CHOL, HDL, LDLCALC, TRIG, CHOLHDL, LDLDIRECT in the last 72 hours. Thyroid  Function Tests: No results for input(s): TSH, T4TOTAL, FREET4, T3FREE, THYROIDAB in the last 72 hours. Anemia Panel: No results for input(s): VITAMINB12, FOLATE, FERRITIN, TIBC, IRON, RETICCTPCT in the last 72 hours. Sepsis Labs: No results for input(s): PROCALCITON, LATICACIDVEN in the last 168 hours.  Recent Results (from the past 240 hours)  Culture, blood (Routine X 2) w Reflex to ID Panel     Status: None   Collection Time: 03/27/24 12:10 PM   Specimen: BLOOD  Result Value Ref Range Status   Specimen Description BLOOD BLOOD RIGHT ARM  Final   Special Requests   Final    BOTTLES DRAWN AEROBIC AND ANAEROBIC Blood Culture adequate volume   Culture   Final    NO GROWTH 5 DAYS Performed at [facility], [address] [place], [place] [address]    Report Status 04/01/2024 FINAL  Final  Culture, blood (Routine X 2) w Reflex to ID Panel     Status: Abnormal   Collection Time: 03/27/24 12:10 PM   Specimen: BLOOD LEFT ARM  Result Value Ref Range Status   Specimen Description   Final    BLOOD LEFT ARM Performed at [facility], [address], [place], [place] [address]    Special Requests   Final    BOTTLES DRAWN AEROBIC AND ANAEROBIC Blood Culture adequate volume Performed at [facility], [address] [place], [place] [address]    Culture  Setup Time   Final    GRAM POSITIVE COCCI IN BOTH AEROBIC AND ANAEROBIC BOTTLES CRITICAL RESULT CALLED TO, READ BACK BY AND VERIFIED WITH: TREY GREENWOOD 03/28/24 0928 MW    Culture (A)  Final    STAPHYLOCOCCUS EPIDERMIDIS THE SIGNIFICANCE OF ISOLATING THIS ORGANISM FROM A SINGLE SET OF BLOOD CULTURES WHEN MULTIPLE SETS ARE DRAWN IS UNCERTAIN. PLEASE NOTIFY THE MICROBIOLOGY DEPARTMENT WITHIN ONE WEEK IF SPECIATION AND SENSITIVITIES ARE REQUIRED. Performed at [facility], [address] [place], [place] [address]    Report Status 03/30/2024 FINAL  Final  Blood Culture ID Panel (Reflexed)     Status: Abnormal  Collection Time: 03/27/24 12:10 PM  Result Value Ref Range Status   Enterococcus faecalis NOT DETECTED NOT DETECTED Final   Enterococcus Faecium NOT DETECTED NOT DETECTED Final   Listeria monocytogenes NOT DETECTED NOT DETECTED Final   Staphylococcus species DETECTED (A) NOT DETECTED Final    Comment: CRITICAL RESULT CALLED TO, READ BACK BY AND VERIFIED WITH: TREY GREENWOOD 03/28/24 0928 MW    Staphylococcus aureus (BCID) NOT DETECTED NOT DETECTED Final   Staphylococcus epidermidis DETECTED (A) NOT DETECTED Final    Comment: Methicillin (oxacillin) resistant coagulase negative staphylococcus. Possible blood culture contaminant (unless isolated from more than one blood culture draw or clinical case suggests pathogenicity). No antibiotic treatment is indicated for blood  culture contaminants. CRITICAL RESULT CALLED TO, READ BACK BY AND VERIFIED WITH: TREY GREENWOOD 03/28/24 0928 MW    Staphylococcus lugdunensis NOT DETECTED NOT DETECTED Final   Streptococcus species NOT DETECTED NOT DETECTED Final   Streptococcus agalactiae NOT DETECTED NOT DETECTED Final   Streptococcus pneumoniae NOT DETECTED NOT DETECTED Final   Streptococcus pyogenes NOT DETECTED NOT DETECTED Final   A.calcoaceticus-baumannii NOT DETECTED NOT DETECTED Final   Bacteroides fragilis NOT DETECTED NOT DETECTED Final   Enterobacterales NOT DETECTED NOT DETECTED Final   Enterobacter cloacae complex NOT DETECTED NOT DETECTED Final   Escherichia coli NOT DETECTED NOT DETECTED Final   Klebsiella aerogenes NOT DETECTED NOT DETECTED Final   Klebsiella oxytoca NOT DETECTED NOT DETECTED Final   Klebsiella pneumoniae NOT DETECTED NOT DETECTED Final   Proteus species NOT DETECTED NOT DETECTED Final   Salmonella species NOT DETECTED NOT DETECTED Final   Serratia marcescens NOT DETECTED NOT DETECTED  Final   Haemophilus influenzae NOT DETECTED NOT DETECTED Final   Neisseria meningitidis NOT DETECTED NOT DETECTED Final   Pseudomonas aeruginosa NOT DETECTED NOT DETECTED Final   Stenotrophomonas maltophilia NOT DETECTED NOT DETECTED Final   Candida albicans NOT DETECTED NOT DETECTED Final   Candida auris NOT DETECTED NOT DETECTED Final   Candida glabrata NOT DETECTED NOT DETECTED Final   Candida krusei NOT DETECTED NOT DETECTED Final   Candida parapsilosis NOT DETECTED NOT DETECTED Final   Candida tropicalis NOT DETECTED NOT DETECTED Final   Cryptococcus neoformans/gattii NOT DETECTED NOT DETECTED Final   Methicillin resistance mecA/C DETECTED (A) NOT DETECTED Final    Comment: CRITICAL RESULT CALLED TO, READ BACK BY AND VERIFIED WITH: MOSE BLEW 03/28/24 9071 MW Performed at [facility], [address] [place], [place] [address]   C Difficile Quick Screen (NO PCR Reflex)     Status: None   Collection Time: 03/28/24  9:15 AM   Specimen: STOOL  Result Value Ref Range Status   C Diff antigen NEGATIVE NEGATIVE Final   C Diff toxin NEGATIVE NEGATIVE Final   C Diff interpretation No C. difficile detected.  Final    Comment: Performed at [facility], [address] [place], [place] [address]  Culture, blood (Routine X 2) w Reflex to ID Panel     Status: None (Preliminary result)   Collection Time: 03/28/24 10:46 AM   Specimen: BLOOD RIGHT HAND  Result Value Ref Range Status   Specimen Description BLOOD RIGHT HAND  Final   Special Requests   Final    BOTTLES DRAWN AEROBIC AND ANAEROBIC Blood Culture adequate volume   Culture   Final    NO GROWTH 4 DAYS Performed at [facility], [address], [place], [place] [address]    Report Status PENDING  Incomplete  Culture, blood (Routine X 2) w Reflex to ID Panel     Status: None (Preliminary result)   Collection Time: 03/28/24 10:46 AM   Specimen: BLOOD RIGHT ARM  Result Value Ref Range Status    Specimen Description BLOOD RIGHT ARM  Final   Special Requests   Final    BOTTLES DRAWN AEROBIC AND ANAEROBIC Blood Culture adequate volume   Culture   Final    NO GROWTH 4 DAYS Performed at [facility], [address] [place], [place] [address]    Report Status PENDING  Incomplete     Radiology Studies: CT CHEST ABDOMEN PELVIS W CONTRAST Result Date: 03/31/2024 EXAM: CT CHEST, ABDOMEN AND PELVIS WITH CONTRAST 03/31/2024 12:06:20 PM TECHNIQUE: CT of the chest, abdomen and pelvis was performed with the administration of 100 mL of iohexol  (OMNIPAQUE ) 300 MG/ML solution. Multiplanar reformatted images are provided for review. Automated exposure control, iterative reconstruction, and/or weight based adjustment of the mA/kV was utilized to reduce the radiation dose to as low as reasonably achievable. COMPARISON: CT of the abdomen and pelvis 03/17/2024. CLINICAL HISTORY: Persistent fevers, looking for occult source of infection. FINDINGS: CHEST: MEDIASTINUM AND LYMPH NODES: Heart and pericardium are unremarkable. Coronary artery calcifications are present. Atherosclerotic calcifications are present at the aortic arch and great vessel origins without focal stenosis or aneurysm. The central airways are clear. No mediastinal, hilar or axillary lymphadenopathy. LUNGS AND PLEURA: The 3 mm pulmonary nodule is present on image 52 of series 4. A 4 mm nodule is noted along the major fissure in the left upper lobe on image 50 of series 4. A 5 mm nodule is present in the right middle lobe on image 61 of series 4. A 4 mm nodule is again noted along the diaphragm in the right middle lobe measuring 4 mm on image 73 of series 4. A 4 mm nodule is present in the left upper lobe on image 66 of series 4. Mild dependent airspace disease is present in both lungs, increased from the prior study. No pleural effusion or pneumothorax. ABDOMEN AND PELVIS: LIVER: Mild fatty infiltration of the liver is noted. GALLBLADDER AND  BILE DUCTS: Gallbladder is unremarkable. No biliary ductal dilatation. SPLEEN: An 8.3 cm cystic lesion in the spleen is stable. PANCREAS: No acute abnormality. ADRENAL GLANDS: No acute abnormality. KIDNEYS, URETERS AND BLADDER: Simple cysts in both kidneys are stable measuring up to 3.5 cm at the lower pole of the left kidney and 3.0 cm posteriorly in the right kidney. Per consensus, no follow-up is needed for simple Bosniak type 1 and 2 renal cysts, unless the patient has a malignancy history or risk factors. No stones in the kidneys or ureters. No hydronephrosis. No perinephric or periureteral stranding. Urinary bladder is unremarkable. GI AND BOWEL: Stomach demonstrates no acute abnormality. Peg tube is in place without complication. Diverticular changes are present in the sigmoid colon without inflammatory change to suggest diverticulitis. A balloon catheter is present in the rectum. There is no bowel obstruction. REPRODUCTIVE ORGANS: Uterus is again noted. PERITONEUM AND RETROPERITONEUM: No ascites. No free air. New presacral edema is present. VASCULATURE: Extensive atherosclerotic changes are present in the abdominal aorta and branch vessels without aneurysm. ABDOMINAL AND PELVIS LYMPH NODES: No lymphadenopathy. BONES AND SOFT TISSUES: No acute osseous abnormality. New edematous changes are present in the dependent subcutaneous tissues of the lower abdomen and pelvis, right greater than left. No discrete abscess is present. IMPRESSION: 1. No acute findings. 2. Mild dependent airspace disease  in both lungs, increased from the prior study. This likely represents atelectasis. 3. Multiple solid pulmonary nodules up to 5 mm without high-risk features; no routine imaging follow-up is recommended per Fleischner Society guidelines. 4. New edematous changes in the dependent subcutaneous tissues of the lower abdomen and pelvis, right greater than left; no discrete abscess. Findings suggest early anasarca. No discrete  abscess is present. Electronically signed by: Lonni Necessary MD 03/31/2024 04:38 PM EST RP Workstation: [id number]   US  Venous Img Lower Bilateral (DVT) Result Date: 03/30/2024 CLINICAL DATA:  10027 Tachypnea [id number] Fever [id number] EXAM: BILATERAL LOWER EXTREMITY VENOUS DOPPLER ULTRASOUND TECHNIQUE: Gray-scale sonography with graded compression, as well as color Doppler and duplex ultrasound were performed to evaluate the lower extremity deep venous systems from the level of the common femoral vein and including the common femoral, femoral, profunda femoral, popliteal and calf veins including the posterior tibial, peroneal and gastrocnemius veins when visible. The superficial great saphenous vein was also interrogated. Spectral Doppler was utilized to evaluate flow at rest and with distal augmentation maneuvers in the common femoral, femoral and popliteal veins. COMPARISON:  None Available. FINDINGS: RIGHT LOWER EXTREMITY Common Femoral Vein: No evidence of thrombus. Normal compressibility, respiratory phasicity and response to augmentation. Saphenofemoral Junction: No evidence of thrombus. Normal compressibility and flow on color Doppler imaging. Profunda Femoral Vein: No evidence of thrombus. Normal compressibility and flow on color Doppler imaging. Femoral Vein: No evidence of thrombus. Normal compressibility, respiratory phasicity and response to augmentation. Popliteal Vein: No evidence of thrombus. Normal compressibility, respiratory phasicity and response to augmentation. Calf Veins: No evidence of thrombus. Normal compressibility and flow on color Doppler imaging. Superficial Great Saphenous Vein: No evidence of thrombus. Normal compressibility. Other Findings:  None. LEFT LOWER EXTREMITY Common Femoral Vein: No evidence of thrombus. Normal compressibility, respiratory phasicity and response to augmentation. Saphenofemoral Junction: No evidence of thrombus. Normal compressibility and flow on color  Doppler imaging. Profunda Femoral Vein: No evidence of thrombus. Normal compressibility and flow on color Doppler imaging. Femoral Vein: No evidence of thrombus. Normal compressibility, respiratory phasicity and response to augmentation. Popliteal Vein: No evidence of thrombus. Normal compressibility, respiratory phasicity and response to augmentation. Calf Veins: No evidence of thrombus. Normal compressibility and flow on color Doppler imaging. Superficial Great Saphenous Vein: No evidence of thrombus. Normal compressibility. Other Findings:  None. IMPRESSION: Negative for deep venous thrombosis within both legs. Electronically Signed   By: Rogelia Myers M.D.   On: 03/30/2024 22:21   CT HEAD WO CONTRAST ( ) Result Date: 03/30/2024 EXAM: CT HEAD WITHOUT CONTRAST 03/30/2024 05:17:31 PM TECHNIQUE: CT of the head was performed without the administration of intravenous contrast. Automated exposure control, iterative reconstruction, and/or weight based adjustment of the mA/kV was utilized to reduce the radiation dose to as low as reasonably achievable. COMPARISON: 03/17/2024. CLINICAL HISTORY: Rule out increased intracranial pressure. FINDINGS: BRAIN AND VENTRICLES: No acute hemorrhage. 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 Chronic small vessel ischemic changes of the white matter. Chronic lacunar infarct right thalamus. No hydrocephalus. No extra-axial collection. No mass effect or midline shift. ORBITS: No acute abnormality. SINUSES: No acute abnormality. SOFT TISSUES AND SKULL: No acute soft tissue abnormality. No skull fracture. Carotid vascular calcification. IMPRESSION: 1. 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 No hemorrhagic transformation or mass effect. 2. Chronic small vessel ischemic changes of the white matter and chronic lacunar infarct right thalamus. Electronically signed by: Franky Stanford MD 03/30/2024 07:57 PM EST RP Workstation: [id number]  Scheduled Meds:  vitamin C   500 mg Per Tube BID   aspirin   81 mg Per NG tube Daily   atorvastatin   80 mg Per NG tube Daily   clopidogrel   75 mg Per Tube Daily   collagenase    Topical Daily   enoxaparin  (LOVENOX ) injection  40 mg Subcutaneous Q24H   feeding supplement (PROSource TF20)  60 mL Per Tube Daily   fiber supplement (BANATROL TF)  60 mL Per Tube BID   free water   300 mL Per Tube Q4H   insulin  aspart  0-15 Units Subcutaneous Q4H   latanoprost   1 drop Both Eyes QHS   losartan   25 mg Per Tube Daily   multivitamin with minerals  1 tablet Per Tube Daily   nutrition supplement (JUVEN)  1 packet Per Tube BID BM   mouth rinse  15 mL Mouth Rinse 4 times per day   propranolol   10 mg Per Tube TID   zinc  sulfate (50mg  elemental zinc )  220 mg Per Tube Daily   Continuous Infusions:  feeding supplement (OSMOLITE 1.5 CAL) 1,000 mL (03/30/24 2100)     Unresulted Labs (From admission, onward)     Start     Ordered   03/30/24 0500  CBC  Daily,   R     Question:  Specimen collection method  Answer:  Lab=Lab collect   03/29/24 1926   03/30/24 0500  Basic metabolic panel with GFR  Daily,   R     Question:  Specimen collection method  Answer:  Lab=Lab collect   03/29/24 1926   03/25/24 0500  Creatinine, serum  (enoxaparin  (LOVENOX )    CrCl >/= 30 ml/min)  Weekly,   TIMED     Comments: while on enoxaparin  therapy    03/18/24 0059             LOS:  LOS: 14 days   Time Spent: 40 minutes  Supriya Beaston Al-Sultani, MD [facility]  If 7PM-7AM, please contact night-coverage  04/01/2024, 2:15 PM

## 2024-04-01 NOTE — Progress Notes (Signed)
 Daily Progress Note   Patient Name: Marie Pham       Date: 04/01/2024 DOB: 10/26/1945  Age: 78 y.o. MRN#: [id number] Attending Physician: Al-Sultani, Anmar, MD Primary Care Physician: Tobie Domino, MD Admit Date: 03/17/2024  Reason for Consultation/Follow-up: Establishing goals of care  HPI/Brief Hospital Review: 78 y.o. female  with past medical history of hypertension, chronic back pain, depression and sleep apnea admitted from home on 03/17/2024 with initial complaints of worsening chronic back pain. Reportedly from family in ED, Ms. Donson had poor PO intake and less activity day prior to admission. Last known well time established in ED >24 hours.   CTA obtained in ED revealed hypodensity in the left basal ganglia suspicious for acute/recent infarct recommendations for MRI to follow-up--occluded versus severely stenotic distal right nondominant vertebral artery--severe bilateral P2 PCA stenosis--- moderate left vertebral artery origin stenosis--- mild to moderate left M1 and M2 MCA stenoses MRI obtained and revealing confluent left ACA territory [name] acute infarct involving inferomedial left caudate nucleus, small area of acute infarct in the proximal right ACA territory and several subcentimeter foci of acute ischemia in the right distal MCA/ACA watershed--cytotoxic edema with no hemorrhagic transformation   CTA chest without evidence of acute pulmonary embolism--concern for possible air trapping or pneumonitis--noted cardiomegaly with coronary artery calcifications   Palliative medicine was consulted for assisting with goals of care conversations.  Subjective: Extensive chart review has been completed prior to meeting patient including labs, vital signs, imaging, progress notes, orders,  and available advanced directive documents from current and previous encounters.    Visited with Ms. Geoffroy at her bedside.  She is resting in bed with eyes halfway opened, not able to track.  She appears comfortable without obvious signs of discomfort or distress.  Daughter at bedside reports uneventful evening.  Daughter shares primary team is making moves to start transition and discharge to long-term care facility.  Daughter is eager to get her placed at [facility] where daughter is comfortable and has many connections.  Family remains hopeful for ongoing recovery and improvement.  Plans and goals remain clear.  PMT to step away from daily visits as plans and goals remain clear.  Will continue to follow peripherally, please reengage if needs or concerns arise.  Objective:  Physical Exam Constitutional:  General: She is not in acute distress.    Appearance: She is ill-appearing.  HENT:     Head: Normocephalic.     Mouth/Throat:     Mouth: Mucous membranes are dry.  Pulmonary:     Effort: Pulmonary effort is normal. No respiratory distress.  Abdominal:     General: There is no distension.     Palpations: Abdomen is soft.  Skin:    General: Skin is warm and dry.  Neurological:     Motor: Weakness present.             Vital Signs: BP (!) 128/51 (BP Location: Right Arm)   Pulse (!) 105   Temp 98.8 F (37.1 C) (Oral)   Resp (!) 26   Ht 5' 5 (1.651 m)   Wt 85.2 kg   SpO2 99%   BMI 31.26 kg/m  SpO2: SpO2: 99 % O2 Device: O2 Device: Nasal Cannula O2 Flow Rate: O2 Flow Rate (L/min): 2 L/min   Palliative Care Assessment & Plan   Assessment/Recommendation/Plan  Continue current plan of care Anticipate discharge to long-term care facility  Thank you for allowing the Palliative Medicine Team to assist in the care of this patient.  Visit includes: Detailed review of medical records (labs, imaging, vital signs), medically appropriate exam (mental status, respiratory,  cardiac, skin), discussed with treatment team, counseling and educating patient, family and staff, documenting clinical information, medication management and coordination of care.  Waddell Lesches, DNP, AGNP-C Palliative Medicine   Please contact Palliative Medicine Team phone at [phone] for questions and concerns.

## 2024-04-01 NOTE — Plan of Care (Signed)
 Pt has fluctuated from drowsy to alert but not verbal. Frequent rounds make to ensure pt head in correct position. Pt family verbalized concern regarding pts head moving and airway being blocked. Pts head positioned frequently.  Pt noted moaning and grunting- PRN tylenol  given for pain and PRN morphine  given for severe pain as well. Medications effective. PRN Ibuprofen  given for fever, medication effective. Pt Dressing to buttocks changed per order. Pt had full bath, full linen change as well. Pt family present at bedside.    Problem: Education: Goal: Knowledge of General Education information will improve Description: Including pain rating scale, medication(s)/side effects and non-pharmacologic comfort measures Outcome: Progressing   Problem: Health Behavior/Discharge Planning: Goal: Ability to manage health-related needs will improve Outcome: Progressing   Problem: Clinical Measurements: Goal: Ability to maintain clinical measurements within normal limits will improve Outcome: Progressing Goal: Will remain free from infection Outcome: Progressing Goal: Diagnostic test results will improve Outcome: Progressing Goal: Respiratory complications will improve Outcome: Progressing Goal: Cardiovascular complication will be avoided Outcome: Progressing   Problem: Activity: Goal: Risk for activity intolerance will decrease Outcome: Progressing   Problem: Nutrition: Goal: Adequate nutrition will be maintained Outcome: Progressing   Problem: Elimination: Goal: Will not experience complications related to bowel motility Outcome: Progressing Goal: Will not experience complications related to urinary retention Outcome: Progressing   Problem: Pain Managment: Goal: General experience of comfort will improve and/or be controlled Outcome: Progressing   Problem: Safety: Goal: Ability to remain free from injury will improve Outcome: Progressing   Problem: Skin Integrity: Goal: Risk for  impaired skin integrity will decrease Outcome: Progressing   Problem: Education: Goal: Knowledge of disease or condition will improve Outcome: Progressing Goal: Knowledge of secondary prevention will improve (MUST DOCUMENT ALL) Outcome: Progressing Goal: Knowledge of patient specific risk factors will improve (DELETE if not current risk factor) Outcome: Progressing   Problem: Ischemic Stroke/TIA Tissue Perfusion: Goal: Complications of ischemic stroke/TIA will be minimized Outcome: Progressing   Problem: Coping: Goal: Will verbalize positive feelings about self Outcome: Progressing Goal: Will identify appropriate support needs Outcome: Progressing   Problem: Health Behavior/Discharge Planning: Goal: Ability to manage health-related needs will improve Outcome: Progressing Goal: Goals will be collaboratively established with patient/family Outcome: Progressing   Problem: Self-Care: Goal: Ability to participate in self-care as condition permits will improve Outcome: Progressing Goal: Verbalization of feelings and concerns over difficulty with self-care will improve Outcome: Progressing Goal: Ability to communicate needs accurately will improve Outcome: Progressing   Problem: Nutrition: Goal: Risk of aspiration will decrease Outcome: Progressing Goal: Dietary intake will improve Outcome: Progressing   Problem: Education: Goal: Ability to describe self-care measures that may prevent or decrease complications (Diabetes Survival Skills Education) will improve Outcome: Progressing Goal: Individualized Educational Video(s) Outcome: Progressing   Problem: Fluid Volume: Goal: Ability to maintain a balanced intake and output will improve Outcome: Progressing   Problem: Health Behavior/Discharge Planning: Goal: Ability to identify and utilize available resources and services will improve Outcome: Progressing Goal: Ability to manage health-related needs will improve Outcome:  Progressing   Problem: Metabolic: Goal: Ability to maintain appropriate glucose levels will improve Outcome: Progressing   Problem: Nutritional: Goal: Maintenance of adequate nutrition will improve Outcome: Progressing Goal: Progress toward achieving an optimal weight will improve Outcome: Progressing   Problem: Skin Integrity: Goal: Risk for impaired skin integrity will decrease Outcome: Progressing

## 2024-04-01 NOTE — Plan of Care (Signed)

## 2024-04-01 NOTE — Consult Note (Addendum)
 WOC Nurse Consult Note: Reason for Consult: wounds underneath breasts  Wound type: full and partial thickness wounds underneath bilateral breasts r/t intertriginous dermatitis red moist  Pressure Injury POA: NA not pressure  Measurement:see nursing flowsheet  Wound bed: red moist  Drainage (amount, consistency, odor) serosanguinous  Periwound: erythematous and moist  Dressing procedure/placement/frequency: Cleanse underneath breasts with soap and water , dry. Apply strips of silver hydrofiber [name] [phone]) Aquacel AG to open wound beds daily then apply Interdry Ag as follows:  Order Gerlean # [id number] Measure and cut length of InterDry to fit in skin folds that have skin breakdown Tuck InterDry fabric into skin folds in a single layer, allow for 2 inches of overhang from skin edges to allow for wicking to occur May remove to bathe; dry area thoroughly and then tuck into affected areas again Do not apply any creams or ointments when using InterDry DO NOT THROW AWAY FOR 5 DAYS unless soiled with stool DO NOT [facility] product, this will inactivate the silver in the material  New sheet of Interdry should be applied after 5 days of use if patient continues to have skin breakdown    POC discussed with bedside nurse. WOC team following on a weekly basis for sacral wound.   Thank you,    Powell Bar MSN, RN-BC, [employer]

## 2024-04-01 NOTE — Progress Notes (Signed)
 Informed Dr. Mosie of open skin to crease under breast

## 2024-04-02 DIAGNOSIS — I639 Cerebral infarction, unspecified: Secondary | ICD-10-CM | POA: Diagnosis not present

## 2024-04-02 LAB — CULTURE, BLOOD (ROUTINE X 2)
Culture: NO GROWTH
Culture: NO GROWTH
Special Requests: ADEQUATE
Special Requests: ADEQUATE

## 2024-04-02 LAB — GLUCOSE, CAPILLARY
Glucose-Capillary: 152 mg/dL — ABNORMAL HIGH (ref 70–99)
Glucose-Capillary: 160 mg/dL — ABNORMAL HIGH (ref 70–99)
Glucose-Capillary: 161 mg/dL — ABNORMAL HIGH (ref 70–99)
Glucose-Capillary: 163 mg/dL — ABNORMAL HIGH (ref 70–99)
Glucose-Capillary: 169 mg/dL — ABNORMAL HIGH (ref 70–99)
Glucose-Capillary: 172 mg/dL — ABNORMAL HIGH (ref 70–99)

## 2024-04-02 LAB — CBC
HCT: 35.5 % — ABNORMAL LOW (ref 36.0–46.0)
Hemoglobin: 11 g/dL — ABNORMAL LOW (ref 12.0–15.0)
MCH: 28.9 pg (ref 26.0–34.0)
MCHC: 31 g/dL (ref 30.0–36.0)
MCV: 93.2 fL (ref 80.0–100.0)
Platelets: 323 K/uL (ref 150–400)
RBC: 3.81 MIL/uL — ABNORMAL LOW (ref 3.87–5.11)
RDW: 13.4 % (ref 11.5–15.5)
WBC: 11.8 K/uL — ABNORMAL HIGH (ref 4.0–10.5)
nRBC: 0 % (ref 0.0–0.2)

## 2024-04-02 LAB — MAGNESIUM: Magnesium: 2.8 mg/dL — ABNORMAL HIGH (ref 1.7–2.4)

## 2024-04-02 LAB — BASIC METABOLIC PANEL WITH GFR
Anion gap: 4 — ABNORMAL LOW (ref 5–15)
BUN: 34 mg/dL — ABNORMAL HIGH (ref 8–23)
CO2: 37 mmol/L — ABNORMAL HIGH (ref 22–32)
Calcium: 9.3 mg/dL (ref 8.9–10.3)
Chloride: 106 mmol/L (ref 98–111)
Creatinine, Ser: 0.52 mg/dL (ref 0.44–1.00)
GFR, Estimated: >60 mL/min (ref >60)
Glucose, Bld: 180 mg/dL — ABNORMAL HIGH (ref 70–99)
Potassium: 5.1 mmol/L (ref 3.5–5.1)
Sodium: 147 mmol/L — ABNORMAL HIGH (ref 135–145)

## 2024-04-02 LAB — PHOSPHORUS: Phosphorus: 3.1 mg/dL (ref 2.5–4.6)

## 2024-04-02 NOTE — Plan of Care (Signed)
  Problem: Clinical Measurements: Goal: Ability to maintain clinical measurements within normal limits will improve Outcome: Progressing Goal: Will remain free from infection Outcome: Progressing Goal: Respiratory complications will improve Outcome: Progressing Goal: Cardiovascular complication will be avoided Outcome: Progressing   Problem: Nutrition: Goal: Adequate nutrition will be maintained Outcome: Progressing   Problem: Coping: Goal: Level of anxiety will decrease Outcome: Progressing

## 2024-04-02 NOTE — Plan of Care (Signed)
  Problem: Clinical Measurements: Goal: Ability to maintain clinical measurements within normal limits will improve Outcome: Progressing   Problem: Nutrition: Goal: Adequate nutrition will be maintained Outcome: Progressing   Problem: Elimination: Goal: Will not experience complications related to bowel motility Outcome: Progressing   Problem: Skin Integrity: Goal: Risk for impaired skin integrity will decrease Outcome: Progressing

## 2024-04-02 NOTE — Progress Notes (Signed)
 PROGRESS NOTE    [name]  FMW:[id number] DOB: 1945/09/20 DOA: 03/17/2024 PCP: Tobie Domino, MD    Brief Narrative:  Marie Pham is a 78 y.o. female with PMHx of HTN, chronic back pain, arthritis, OSA, depression, asthma/bronchitis, and baseline limited mobility (ambulates with walker) who presented with poor oral intake and new speech difficulty.  Family reported she appeared to understand but was unable to respond verbally.  Last well-known was >24 hours before arrival.  In ED, she was afebrile with a temp of 98.7 F, RR 18, HR 115, BP 120/99, SpO2 95% on RA, placed on 2 L O2 for comfort.  Initial labs showed mild polycythemia, hyponatremia (NA 147), Troponins were negative, mildly elevated D-dimer to 0.92.  EKG showed sinus tachycardia, prolonged QTc to 494 ms. CTA head/neck demonstrated an acute left basal ganglia infarct.  She received an NS bolus, Cardizem , and aspirin  suppository.  She was admitted for stroke management.  MRI (11/22) confirmed bilateral ACA territory infarcts with cytotoxic edema (L >R).  TTE (11/22) showed LVEF 60-65%, G3DD, no atrial level shunt detected by color-flow Doppler. She failed swallow eval and underwent dobhoff feeding tube placemenet by IR on 11/24. Shortly after admission she became progressively more somnolent and then minimally responsive.  EEG (11/26) showed generalized cortical dysfunction without seizures. Repeat MRI (11/27) showed further evolution of the bilateral ACA strokes without hemorrhagic transformation.  Neurology was consulted and advised that prognosis of meaningful neurologic recovery is extremely poor. She underwent PEG tube placement on 12/3.   Her course was complicated by recurrent fevers and persistent tachypnea.  There was concern for aspiration on 11/27 and she was started on Rocephin  and Flagyl .  Fevers recurred, prompting ID involvement.  CXR repeated and demonstrated low lung volumes and bronchovascular crowding without focal  consolidation.  1 of 2 blood cultures (12/1) was positive for MRSE (likely contaminant), and repeat cultures were sent (12/2) prior to antibiotic change which showed no growth.  Antibiotics were escalated to cefepime  plus vancomycin , later changed to meropenem  due to concern for cefepime  worsening encephalopathy.  C. difficile PCR remained negative.  Given extensive bifrontal injury and cytotoxic edema, ID felt some fevers were likely central.   Neurologically, she has remained severely encephalopathic.  She intermittently opens her eyes to voice, and per daughter had occasional spontaneous eye opening and brief tracking, but no sustained purposeful responsiveness.  Prognosis remains extremely guarded.   12/6 - no fevers, tachycardia improved, tachypnea persistent. WBC improving. Meropenem  discontinued given negative CT chest/abd/pelvis. Hyperkalemia to 5.2, resolved with Lokelma  to 4.9 on repeat. TF adjusted to Osmolite 1.2, banatrol discontinued. Facility has not yet received air mattress or tube feed pump.  12/7 - no fevers, WBC continues to improve. Na 147, K 5.1 , Mg 2.8. 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 - PEG placed 12/3, internal nutrition per PEG started per RD - Continue aspirin , Lipitor , Plavix  (resumed 1 day after PEG placement) - PT OT evaluated, recommended SNF - Palliative care following - Neurology reengaged - recommended CT head noncon to rule out increased ICP - CT head noncon showed evolving bilateral  ACA infarcts  without hemorrhaging transformation or mass effect, chronic small vessel ischemic white matter changes and lacunar infarct of right thalamus  Recurrent fevers  SIRS Possible aspiration pneumonitis +/- central fever / sympathetic hyperactivity - CTA chest (11/22) with bilateral pneumonitis likely aspiration related, no PE - Treated initially with Rocephin  and azithromycin , later Rocephin  and Flagyl  after concerns for aspiration overnight on 11/27 - Had recurrent fevers > 101 F on 12/1 with leukocytosis, blood cultures were obtained - CXR without focal infectious source - ID was consulted - Blood cultures (12/1) 1 of 2 positive for MRSE, likely contaminant.  Blood cultures repeated on 12/2 prior to starting vancomycin  - Antibiotics escalated to cefepime  plus vancomycin , later revised to meropenem  per ID on 12/2 - C. Diff PCR negative - Blood cultures (12/2) NGTD - UA (12/3) negative  - On 12/4. continued to be febrile with Tmax 102.9 F and leukocytosis to 18.1 - Neurology reengaged due to concern for central fever, recommended scheduled Tylenol , propranolol  10 mg TID, gradual taper off Coreg , cooling blankets. Recommended reconsult if no improvement in fevers in 3 days.  - ID recommended obtaining LE doppler to rule out DVT, possibly followed by CT c/a/p if negative - LE doppler negative for DVTs bilaterally - Vancomycin  discontinued on 12/4 - CT chest/abd/pelvis with contrast unrevealing of source of infection - Meropenem  discontinued on 12/5 - Afebrile thus far today, leukocytosis improving to 11.8  Tachypnea  Tachycardia - Etiology unclear, possibly related to central fevers and possible sympathetic hyperactivity/storming - Propranalol 10 mg TID started per neurology's recommendation  - Tachycardia to 95-105, still improved from prior - Tachypnea persistent with rapid shallow breaths 21-39  Dysphagia secondary to CVA - PEG tube placed on 12/3 - RD following for continuation of  tube feeds and PRN adjustment  Hypernatremia 2/2 free water  deficit - Na continues to be elevated, likely increased losses from tachypnea/fevers - Discussed with RD, free water  flushes adjusted - Continue to monitor Na levels  Severe aortic stenosis  grade 3 diastolic dysfunction - Stable hemodynamics - Will need outpatient follow-up with cardiologist if feasible  Hypertension - SBP in 113-158 - Propranolol  started per neurology for possible central fevers / sympathetic hyperactivity - Continue propranolol  10 mg TID   Chronic back pain/lumbar radiculitis - Supportive management - Currently patient is unresponsive and unable to indicate pain level   Hyperglycemia - Hgb A1c 6.1 (11/22) - Continue to monitor given tube feeds - SSI    Sacral pressure wound   - Air mattress in place - Change position every 2 hour - Wound care consulted and following  Goals of care - Family wishes to continue full scope of care and observe for any neurologic improvement over the coming months before reconsidering hospice - Will need referral to outpatient palliative care through [facility] on discharge - Prognosis remains extremely guarded  DVT prophylaxis: enoxaparin  (LOVENOX ) injection 40 mg Start: 03/30/24 1100 Place and maintain sequential compression device Start: 03/28/24 1531   Code Status:   Code Status: Do not attempt resuscitation (DNR) PRE-ARREST INTERVENTIONS DESIRED  Family Communication: Discussed patient's current plan of care and extremely guarded prognosis with patient's daughter at bedside.    Disposition Plan: Medically ready for discharge. Discharge to SNF when they have secured an air mattress and TF pump   PT - Follow Up Recommendations: Long-term institutional care without follow-up therapy OT - Follow Up Recommendations: Long-term institutional care without follow-up therapy  DME Needs: PT equipment: Other (comment) (  ongoing assessment due to lack of  progress)    Level of care: Progressive  Consultants:  Neurology, infectious disease  Procedures:  IR Dobhoff placement - 11/24 IR PEG tube placement - 12/3  Antimicrobials: Vancomycin  and meropenem  - discontinued   Subjective: Patient evaluated at bedside in the presence of patient's daughter. Remains largely unresponsive. Very limited and brief eye opening to verbal and noxious stimuli. Unable to follow commands or track.  Objective: Vitals:   04/01/24 2100 04/02/24 0035 04/02/24 0346 04/02/24 0800  BP:    (!) 158/72  Pulse:    (!) 102  Resp:    (!) 38  Temp: 98.7 F (37.1 C) 98.4 F (36.9 C) (!) 97.1 F (36.2 C) 99 F (37.2 C)  TempSrc: Axillary   Axillary  SpO2:    99%  Weight:      Height:        Intake/Output Summary (Last 24 hours) at 04/02/2024 9072 Last data filed at 04/02/2024 0800 Gross per 24 hour  Intake 1830 ml  Output 1260 ml  Net 570 ml   Filed Weights   03/29/24 1500 03/30/24 0500  Weight: 85.8 kg 85.2 kg    Examination:  Physical Exam Constitutional:      Appearance: She is ill-appearing.     Interventions: Nasal cannula in place.  HENT:     Mouth/Throat:     Mouth: Mucous membranes are dry.  Eyes:     Comments: Pupils equal, constricted, very minimally reactive, no purposeful eye movement, does not track. Very brief and limited opening of eyes to noxious stimuli today.  Cardiovascular:     Rate and Rhythm: Normal rate and regular rhythm.     Heart sounds: Murmur heard.     Systolic murmur is present.  Pulmonary:     Effort: Pulmonary effort is normal. Tachypnea present. No respiratory distress.     Comments: Unable to auscultate posterior lung fields. Breaths are rapid and shallow.  Abdominal:     General: There is no distension.     Palpations: Abdomen is soft.     Tenderness: There is no abdominal tenderness. There is no guarding.     Comments: PEG tube in place, dressing c/d/i   Genitourinary:    Comments: Purewick in place.  FMS in place.  Musculoskeletal:     Right lower leg: No edema.     Left lower leg: No edema.  Skin:    General: Skin is warm and dry.     Capillary Refill: Capillary refill takes less than 2 seconds.     Findings: Wound (bilateral buttocks, as below) present.  Neurological:     Mental Status: She is unresponsive.      Data Reviewed: I have personally reviewed following labs and imaging studies  CBC: Recent Labs  Lab 03/27/24 1210 03/28/24 0513 03/29/24 0449 03/30/24 0425 03/31/24 0434 04/01/24 0621 04/02/24 0557  WBC 13.2* 15.0* 17.6* 18.1* 15.5* 13.9* 11.8*  NEUTROABS 8.7* 10.6* 13.5*  --   --   --   --   HGB 11.9* 12.2 12.3 12.6 11.8* 11.1* 11.0*  HCT 37.6 38.5 38.5 39.8 38.4 36.2 35.5*  MCV 89.7 90.6 90.4 90.7 93.2 94.3 93.2  PLT 254 265 296 322 314 305 323   Basic Metabolic Panel: Recent Labs  Lab 03/27/24 1210 03/28/24 0513 03/29/24 0449 03/30/24 0425 03/31/24 0434 04/01/24 0621 04/01/24 1225 04/02/24 0557  NA 146* 145 147* 148*  148* 149* 147* 148* 147*  K 4.5 4.8 4.5  4.9  4.8 4.6 5.2* 4.9 5.1  CL 108 107 107 110  109 112* 109 108 106  CO2 31 29 32 31  31 32 34* 35* 37*  GLUCOSE 231* 227* 111* 162*  164* 179* 168* 155* 180*  BUN 26* 21 22 29*  29* 33* 39* 40* 34*  CREATININE 0.73 0.68 0.65 0.74  0.74 0.57 0.62 0.51 0.52  CALCIUM  8.7* 8.9 9.1 9.1  9.1 8.8* 9.0 9.2 9.3  MG  --   --   --   --   --   --   --  2.8*  PHOS 4.0 3.8 4.3 3.3  --   --   --  3.1   GFR: Estimated Creatinine Clearance: 65.5 mL/min (by C-G formula based on SCr of 0.52 mg/dL). Liver Function Tests: Recent Labs  Lab 03/27/24 1210 03/28/24 0513 03/29/24 0449 03/30/24 0425  AST  --  39  --   --   ALT  --  33  --   --   ALKPHOS  --  81  --   --   BILITOT  --  0.2  --   --   PROT  --  6.6  --   --   ALBUMIN 3.1* 3.2*  3.2* 3.2* 3.3*   No results for input(s): LIPASE, AMYLASE in the last 168 hours. No results for input(s): AMMONIA in the last 168  hours.  Coagulation Profile: No results for input(s): INR, PROTIME in the last 168 hours. Cardiac Enzymes: Recent Labs  Lab 03/29/24 0449  CKTOTAL 139   BNP (last 3 results) Recent Labs    03/29/24 0449  PROBNP 286.0   HbA1C: No results for input(s): HGBA1C in the last 72 hours. CBG: Recent Labs  Lab 04/01/24 1647 04/01/24 2059 04/02/24 0038 04/02/24 0344 04/02/24 0828  GLUCAP 191* 185* 172* 161* 160*   Lipid Profile: No results for input(s): CHOL, HDL, LDLCALC, TRIG, CHOLHDL, LDLDIRECT in the last 72 hours. Thyroid  Function Tests: No results for input(s): TSH, T4TOTAL, FREET4, T3FREE, THYROIDAB in the last 72 hours. Anemia Panel: No results for input(s): VITAMINB12, FOLATE, FERRITIN, TIBC, IRON, RETICCTPCT in the last 72 hours. Sepsis Labs: No results for input(s): PROCALCITON, LATICACIDVEN in the last 168 hours.  Recent Results (from the past 240 hours)  Culture, blood (Routine X 2) w Reflex to ID Panel     Status: None   Collection Time: 03/27/24 12:10 PM   Specimen: BLOOD  Result Value Ref Range Status   Specimen Description BLOOD BLOOD RIGHT ARM  Final   Special Requests   Final    BOTTLES DRAWN AEROBIC AND ANAEROBIC Blood Culture adequate volume   Culture   Final    NO GROWTH 5 DAYS Performed at [facility], [address], [place], [place] [address]    Report Status 04/01/2024 FINAL  Final  Culture, blood (Routine X 2) w Reflex to ID Panel     Status: Abnormal   Collection Time: 03/27/24 12:10 PM   Specimen: BLOOD LEFT ARM  Result Value Ref Range Status   Specimen Description   Final    BLOOD LEFT ARM Performed at [facility], [address], [place], [place] [address]    Special Requests   Final    BOTTLES DRAWN AEROBIC AND ANAEROBIC Blood Culture adequate volume Performed at [facility], [address], [place], [place] [address]    Culture  Setup Time   Final    GRAM  POSITIVE  COCCI IN BOTH AEROBIC AND ANAEROBIC BOTTLES CRITICAL RESULT CALLED TO, READ BACK BY AND VERIFIED WITH: TREY GREENWOOD 03/28/24 0928 MW    Culture (A)  Final    STAPHYLOCOCCUS EPIDERMIDIS THE SIGNIFICANCE OF ISOLATING THIS ORGANISM FROM A SINGLE SET OF BLOOD CULTURES WHEN MULTIPLE SETS ARE DRAWN IS UNCERTAIN. PLEASE NOTIFY THE MICROBIOLOGY DEPARTMENT WITHIN ONE WEEK IF SPECIATION AND SENSITIVITIES ARE REQUIRED. Performed at [facility], [address], [place], [place] [address]    Report Status 03/30/2024 FINAL  Final  Blood Culture ID Panel (Reflexed)     Status: Abnormal   Collection Time: 03/27/24 12:10 PM  Result Value Ref Range Status   Enterococcus faecalis NOT DETECTED NOT DETECTED Final   Enterococcus Faecium NOT DETECTED NOT DETECTED Final   Listeria monocytogenes NOT DETECTED NOT DETECTED Final   Staphylococcus species DETECTED (A) NOT DETECTED Final    Comment: CRITICAL RESULT CALLED TO, READ BACK BY AND VERIFIED WITH: TREY GREENWOOD 03/28/24 0928 MW    Staphylococcus aureus (BCID) NOT DETECTED NOT DETECTED Final   Staphylococcus epidermidis DETECTED (A) NOT DETECTED Final    Comment: Methicillin (oxacillin) resistant coagulase negative staphylococcus. Possible blood culture contaminant (unless isolated from more than one blood culture draw or clinical case suggests pathogenicity). No antibiotic treatment is indicated for blood  culture contaminants. CRITICAL RESULT CALLED TO, READ BACK BY AND VERIFIED WITH: TREY GREENWOOD 03/28/24 0928 MW    Staphylococcus lugdunensis NOT DETECTED NOT DETECTED Final   Streptococcus species NOT DETECTED NOT DETECTED Final   Streptococcus agalactiae NOT DETECTED NOT DETECTED Final   Streptococcus pneumoniae NOT DETECTED NOT DETECTED Final   Streptococcus pyogenes NOT DETECTED NOT DETECTED Final   A.calcoaceticus-baumannii NOT DETECTED NOT DETECTED Final   Bacteroides fragilis NOT DETECTED NOT DETECTED Final   Enterobacterales  NOT DETECTED NOT DETECTED Final   Enterobacter cloacae complex NOT DETECTED NOT DETECTED Final   Escherichia coli NOT DETECTED NOT DETECTED Final   Klebsiella aerogenes NOT DETECTED NOT DETECTED Final   Klebsiella oxytoca NOT DETECTED NOT DETECTED Final   Klebsiella pneumoniae NOT DETECTED NOT DETECTED Final   Proteus species NOT DETECTED NOT DETECTED Final   Salmonella species NOT DETECTED NOT DETECTED Final   Serratia marcescens NOT DETECTED NOT DETECTED Final   Haemophilus influenzae NOT DETECTED NOT DETECTED Final   Neisseria meningitidis NOT DETECTED NOT DETECTED Final   Pseudomonas aeruginosa NOT DETECTED NOT DETECTED Final   Stenotrophomonas maltophilia NOT DETECTED NOT DETECTED Final   Candida albicans NOT DETECTED NOT DETECTED Final   Candida auris NOT DETECTED NOT DETECTED Final   Candida glabrata NOT DETECTED NOT DETECTED Final   Candida krusei NOT DETECTED NOT DETECTED Final   Candida parapsilosis NOT DETECTED NOT DETECTED Final   Candida tropicalis NOT DETECTED NOT DETECTED Final   Cryptococcus neoformans/gattii NOT DETECTED NOT DETECTED Final   Methicillin resistance mecA/C DETECTED (A) NOT DETECTED Final    Comment: CRITICAL RESULT CALLED TO, READ BACK BY AND VERIFIED WITH: MOSE BLEW 03/28/24 9071 MW Performed at [facility], [address] [place], [place] [address]   C Difficile Quick Screen (NO PCR Reflex)     Status: None   Collection Time: 03/28/24  9:15 AM   Specimen: STOOL  Result Value Ref Range Status   C Diff antigen NEGATIVE NEGATIVE Final   C Diff toxin NEGATIVE NEGATIVE Final   C Diff interpretation No C. difficile detected.  Final    Comment: Performed at [facility], [address] [place], [place] [address]  Culture, blood (Routine X 2) w Reflex to ID Panel     Status: None   Collection Time: 03/28/24 10:46 AM   Specimen: BLOOD RIGHT HAND  Result Value Ref Range Status   Specimen Description BLOOD RIGHT HAND  Final    Special Requests   Final    BOTTLES DRAWN AEROBIC AND ANAEROBIC Blood Culture adequate volume   Culture   Final    NO GROWTH 5 DAYS Performed at [facility], [address], [place], [place] [address]    Report Status 04/02/2024 FINAL  Final  Culture, blood (Routine X 2) w Reflex to ID Panel     Status: None   Collection Time: 03/28/24 10:46 AM   Specimen: BLOOD RIGHT ARM  Result Value Ref Range Status   Specimen Description BLOOD RIGHT ARM  Final   Special Requests   Final    BOTTLES DRAWN AEROBIC AND ANAEROBIC Blood Culture adequate volume   Culture   Final    NO GROWTH 5 DAYS Performed at [facility], [address], [place], [place] [address]    Report Status 04/02/2024 FINAL  Final     Radiology Studies: CT CHEST ABDOMEN PELVIS W CONTRAST Result Date: 03/31/2024 EXAM: CT CHEST, ABDOMEN AND PELVIS WITH CONTRAST 03/31/2024 12:06:20 PM TECHNIQUE: CT of the chest, abdomen and pelvis was performed with the administration of 100 mL of iohexol  (OMNIPAQUE ) 300 MG/ML solution. Multiplanar reformatted images are provided for review. Automated exposure control, iterative reconstruction, and/or weight based adjustment of the mA/kV was utilized to reduce the radiation dose to as low as reasonably achievable. COMPARISON: CT of the abdomen and pelvis 03/17/2024. CLINICAL HISTORY: Persistent fevers, looking for occult source of infection. FINDINGS: CHEST: MEDIASTINUM AND LYMPH NODES: Heart and pericardium are unremarkable. Coronary artery calcifications are present. Atherosclerotic calcifications are present at the aortic arch and great vessel origins without focal stenosis or aneurysm. The central airways are clear. No mediastinal, hilar or axillary lymphadenopathy. LUNGS AND PLEURA: The 3 mm pulmonary nodule is present on image 52 of series 4. A 4 mm nodule is noted along the major fissure in the left upper lobe on image 50 of series 4. A 5 mm nodule is present in the right middle  lobe on image 61 of series 4. A 4 mm nodule is again noted along the diaphragm in the right middle lobe measuring 4 mm on image 73 of series 4. A 4 mm nodule is present in the left upper lobe on image 66 of series 4. Mild dependent airspace disease is present in both lungs, increased from the prior study. No pleural effusion or pneumothorax. ABDOMEN AND PELVIS: LIVER: Mild fatty infiltration of the liver is noted. GALLBLADDER AND BILE DUCTS: Gallbladder is unremarkable. No biliary ductal dilatation. SPLEEN: An 8.3 cm cystic lesion in the spleen is stable. PANCREAS: No acute abnormality. ADRENAL GLANDS: No acute abnormality. KIDNEYS, URETERS AND BLADDER: Simple cysts in both kidneys are stable measuring up to 3.5 cm at the lower pole of the left kidney and 3.0 cm posteriorly in the right kidney. Per consensus, no follow-up is needed for simple Bosniak type 1 and 2 renal cysts, unless the patient has a malignancy history or risk factors. No stones in the kidneys or ureters. No hydronephrosis. No perinephric or periureteral stranding. Urinary bladder is unremarkable. GI AND BOWEL: Stomach demonstrates no acute abnormality. Peg tube is in place without complication. Diverticular changes are present in the sigmoid colon without  inflammatory change to suggest diverticulitis. A balloon catheter is present in the rectum. There is no bowel obstruction. REPRODUCTIVE ORGANS: Uterus is again noted. PERITONEUM AND RETROPERITONEUM: No ascites. No free air. New presacral edema is present. VASCULATURE: Extensive atherosclerotic changes are present in the abdominal aorta and branch vessels without aneurysm. ABDOMINAL AND PELVIS LYMPH NODES: No lymphadenopathy. BONES AND SOFT TISSUES: No acute osseous abnormality. New edematous changes are present in the dependent subcutaneous tissues of the lower abdomen and pelvis, right greater than left. No discrete abscess is present. IMPRESSION: 1. No acute findings. 2. Mild dependent airspace  disease in both lungs, increased from the prior study. This likely represents atelectasis. 3. Multiple solid pulmonary nodules up to 5 mm without high-risk features; no routine imaging follow-up is recommended per Fleischner Society guidelines. 4. New edematous changes in the dependent subcutaneous tissues of the lower abdomen and pelvis, right greater than left; no discrete abscess. Findings suggest early anasarca. No discrete abscess is present. Electronically signed by: Lonni Necessary MD 03/31/2024 04:38 PM EST RP Workstation: [id number]    Scheduled Meds:  vitamin C   500 mg Per Tube BID   aspirin   81 mg Per NG tube Daily   atorvastatin   80 mg Per NG tube Daily   clopidogrel   75 mg Per Tube Daily   collagenase    Topical Daily   enoxaparin  (LOVENOX ) injection  40 mg Subcutaneous Q24H   feeding supplement (PROSource TF20)  60 mL Per Tube Daily   free water   300 mL Per Tube Q4H   insulin  aspart  0-15 Units Subcutaneous Q4H   latanoprost   1 drop Both Eyes QHS   losartan   25 mg Per Tube Daily   multivitamin with minerals  1 tablet Per Tube Daily   nutrition supplement (JUVEN)  1 packet Per Tube BID BM   mouth rinse  15 mL Mouth Rinse 4 times per day   propranolol   10 mg Per Tube TID   zinc  sulfate (50mg  elemental zinc )  220 mg Per Tube Daily   Continuous Infusions:  feeding supplement (OSMOLITE 1.2 CAL) 1,000 mL (04/01/24 1712)     Unresulted Labs (From admission, onward)     Start     Ordered   03/30/24 0500  CBC  Daily,   R     Question:  Specimen collection method  Answer:  Lab=Lab collect   03/29/24 1926   03/30/24 0500  Basic metabolic panel with GFR  Daily,   R     Question:  Specimen collection method  Answer:  Lab=Lab collect   03/29/24 1926   03/25/24 0500  Creatinine, serum  (enoxaparin  (LOVENOX )    CrCl >/= 30 ml/min)  Weekly,   TIMED     Comments: while on enoxaparin  therapy    03/18/24 0059             LOS:  LOS: 15 days   Time Spent: 40  minutes  Stanisha Lorenz Al-Sultani, MD [facility]  If 7PM-7AM, please contact night-coverage  04/02/2024, 9:27 AM

## 2024-04-03 LAB — GLUCOSE, CAPILLARY
Glucose-Capillary: 140 mg/dL — ABNORMAL HIGH (ref 70–99)
Glucose-Capillary: 144 mg/dL — ABNORMAL HIGH (ref 70–99)
Glucose-Capillary: 147 mg/dL — ABNORMAL HIGH (ref 70–99)
Glucose-Capillary: 148 mg/dL — ABNORMAL HIGH (ref 70–99)
Glucose-Capillary: 171 mg/dL — ABNORMAL HIGH (ref 70–99)
Glucose-Capillary: 187 mg/dL — ABNORMAL HIGH (ref 70–99)
Glucose-Capillary: 197 mg/dL — ABNORMAL HIGH (ref 70–99)

## 2024-04-03 LAB — CBC
HCT: 35.2 % — ABNORMAL LOW (ref 36.0–46.0)
Hemoglobin: 10.6 g/dL — ABNORMAL LOW (ref 12.0–15.0)
MCH: 28.1 pg (ref 26.0–34.0)
MCHC: 30.1 g/dL (ref 30.0–36.0)
MCV: 93.4 fL (ref 80.0–100.0)
Platelets: 337 K/uL (ref 150–400)
RBC: 3.77 MIL/uL — ABNORMAL LOW (ref 3.87–5.11)
RDW: 13.3 % (ref 11.5–15.5)
WBC: 10.9 K/uL — ABNORMAL HIGH (ref 4.0–10.5)
nRBC: 0 % (ref 0.0–0.2)

## 2024-04-03 LAB — BASIC METABOLIC PANEL WITH GFR
Anion gap: 3 — ABNORMAL LOW (ref 5–15)
BUN: 32 mg/dL — ABNORMAL HIGH (ref 8–23)
CO2: 38 mmol/L — ABNORMAL HIGH (ref 22–32)
Calcium: 10.4 mg/dL — ABNORMAL HIGH (ref 8.9–10.3)
Chloride: 101 mmol/L (ref 98–111)
Creatinine, Ser: 0.47 mg/dL (ref 0.44–1.00)
GFR, Estimated: >60 mL/min (ref >60)
Glucose, Bld: 142 mg/dL — ABNORMAL HIGH (ref 70–99)
Potassium: 5.3 mmol/L — ABNORMAL HIGH (ref 3.5–5.1)
Sodium: 143 mmol/L (ref 135–145)

## 2024-04-03 MED ORDER — PROPRANOLOL HCL 20 MG PO TABS
20.0000 mg | ORAL_TABLET | Freq: Three times a day (TID) | ORAL | Status: DC
  Administered 2024-04-03 – 2024-04-04 (×5): 20 mg
  Filled 2024-04-03 (×6): qty 1

## 2024-04-03 NOTE — TOC Progression Note (Addendum)
 Transition of Care [facility]) - Progression Note    Patient Details  Name: Marie Pham MRN: [id number] Date of Birth: 05-06-1945  Transition of Care [facility]) CM/SW Contact  Lauraine JAYSON Carpen, LCSW Phone Number: 04/03/2024, 9:20 AM  Clinical Narrative:  Left voicemail for Clotilda at [facility] to check status of air mattress and feeding tube supplies.   10:36 am: Clotilda confirmed they have patient's air mattress and feeding tube supplies. They can accept patient today if stable and will need the discharge summary before 3:00. Sent secure chat to MD to notify.  10:58 am: Received call back from [facility] and one of the SNF's nurses. [facility] is shipping patient's tube feed supplies overnight through [employer] so they will not be able to accept patient until they receive them tomorrow.  11:10 am: Updated daughter at bedside.  Expected Discharge Plan: Skilled Nursing Facility Barriers to Discharge: Continued Medical Work up               Expected Discharge Plan and Services     Post Acute Care Choice: Skilled Nursing Facility Living arrangements for the past 2 months: Single Family Home                                       Social Drivers of Health (SDOH) Interventions SDOH Screenings   Food Insecurity: Patient Unable To Answer (03/18/2024)  Housing: Patient Unable To Answer (03/18/2024)  Transportation Needs: Patient Unable To Answer (03/18/2024)  Utilities: Patient Unable To Answer (03/18/2024)  Financial Resource Strain: High Risk (09/01/2023)   Received from [facility]  Social Connections: Patient Unable To Answer (03/18/2024)  Tobacco Use: Medium Risk (03/17/2024)    Readmission Risk Interventions     No data to display

## 2024-04-03 NOTE — Progress Notes (Addendum)
 Nutrition Follow-up  DOCUMENTATION CODES:   Obesity unspecified  INTERVENTION:   TF via PEG:  Osmolite 1.2 at 60 ml/h Continue Prosource TF20 60 ml once daily Free water  flush 300 ml every 4 hours per MD Provides 1808 kcal, 100 grams of protein, and 1181 ml of free water . Total free water : 2981 ml daily    Continue MVI with minerals daily via tube   Continue 500 mg vitamin C  BID via tube   Continue 220 mg zinc  sulfate daily x 14 days via tube   Continue 1 packet Juven BID via tube, each packet provides 95 calories, 2.5 grams of protein (collagen), and 9.8 grams of carbohydrate (3 grams sugar); also contains 7 grams of L-arginine and L-glutamine, 300 mg vitamin C , 15 mg vitamin E, 1.2 mcg vitamin B-12, 9.5 mg zinc , 200 mg calcium , and 1.5 g  Calcium  Beta-hydroxy-Beta-methylbutyrate to support wound healing    NUTRITION DIAGNOSIS:   Inadequate oral intake related to inability to eat, dysphagia as evidenced by NPO status.  Ongoing  GOAL:   Patient will meet greater than or equal to 90% of their needs  Met with TF  MONITOR:   Diet advancement, TF tolerance  REASON FOR ASSESSMENT:   Consult Wound healing  ASSESSMENT:   78 y.o. female with medical history significant for Hypertension, arthritis, chronic back pain, ambulance at baseline with a walker being admitted for an acute CVA.  11/22- NPO with ice chips 11/23- bedside NGT placement attempted by RN, unsuccessful; per SLP unable to assess due to lethargy 11/24- NGT placed via fluoroscopy (tip of tube in lumen per PA note); TF initiated 11/24- s/p BSE- NPO 11/25- rapid response called secondary to lethargy and tachypenia, SLP signed off 11/26- s/p EEG- consistent with a generalized nonspecific cerebral dysfunction, though triphasic waves are nonspecific and they can be associated with toxic/metabolic etiologies of encephalopathy; no seizure activity noted 11/30- rectal tube placed secondary to diarrhea 12/1- NGT  pulled out, new dobhoff tube placed via fluoroscopy  12/3- PEG placed, TF resumed  Reviewed I/O's: +3.3 L x 24 hours and +21 L since 03/20/24  UOP: 600 ml x 24 hours  Per CWOCN notes, patient with deep tissue pressure injury to bilateral buttocks which has evolved to stage 3 across bilateral buttocks and sacrum with unstageable slough to the wounds. Patient also with full and partial thickness wounds underneath bilateral breasts r/t intertriginous dermatitis.   Case discussed with RN, who reports patient remains minimally responsive and iterating TF well.   Case discussed with MD and pharmacy; patient with mild hyperkalemia on low dose losartan . Pharmacy suspect this may be a drug effects, however, discussed with RD regarding TF regimen. Noted TF formula was changed to lower Osmolality and banatrol (which has some K in it) was discontinued on 04/01/24. K and Na levels have improved since changes made to TF. MD increased propranolol  and d/c'd losartan .   Weight has been stable over the past week (85.1-85.8 kg).   Per TOC notes, likely plan to discharge to SNF tomorrow (04/04/24) if needed supplies are delivered to facility.   Labs reviewed: K: 5.3, Mg: 2.8, K: 5.23, CBGS: 140-187 (inpatient orders for glycemic control are 0-15 units insulin  aspart every 4 hours).    Diet Order:   Diet Order             Diet NPO time specified  Diet effective midnight  EDUCATION NEEDS:   Education needs have been addressed  Skin:  Skin Assessment: Skin Integrity Issues: Skin Integrity Issues:: Stage III, Other (Comment) DTI: sacrum and bilateral buttocks; dark discoloration evolving with red moist areas to buttocks and linear red moist area coccyx Stage III: wounds evolved to stage 3across bilateral buttocks and sacrum with unstageable slough to the wounds Other: full and partial thickness wounds underneath bilateral breasts r/t intertriginous dermatitis  Last BM:  04/03/24 (type  3)  Height:   Ht Readings from Last 1 Encounters:  03/29/24 5' 5 (1.651 m)    Weight:   Wt Readings from Last 1 Encounters:  03/30/24 85.2 kg    Ideal Body Weight:  56.8 kg  BMI:  Body mass index is 31.26 kg/m.  Estimated Nutritional Needs:   Kcal:  1800-2000  Protein:  95-110 grams  Fluid:  1.8-2.0 L    Margery ORN, RD, LDN, CDCES Registered Dietitian III Certified Diabetes Care and Education Specialist If unable to reach this RD, please use RD Inpatient group chat on secure chat between hours of 8am-4 pm daily

## 2024-04-03 NOTE — Plan of Care (Signed)
  Problem: Clinical Measurements: Goal: Cardiovascular complication will be avoided Outcome: Progressing   Problem: Safety: Goal: Ability to remain free from injury will improve Outcome: Progressing   

## 2024-04-03 NOTE — Consult Note (Addendum)
 WOC Nurse wound follow up Refer to previous consult notes on 12/3; Pt was noted to have a Deep tissue pressure injury  (DTPI) to bilat buttocks.  Follow-up performed remotely after review of progress notes and photos in the EMR.  DTPI has evolved into red moist Stage 3 pressure injuries across bilat buttocks and sacrum with patchy areas of Unstageable slough to the wounds remaining.    Continue present plan of care as follows to assist with removal of nonviable tissue: Cleanse sacrum/coccyx/buttocks with Vashe wound cleanser ([place] [phone]) do not rinse. Apply Santyl  to the left buttock wound (non viable tissue) cover this area and entire wound with  xeroform and secure with silicone foam or ABD pad and tape whichever is preferred.  WOC team will reassess the location Q 7-10 days to determine if a change in the plan of care is indicated at that time.   Thank-you,  Stephane Fought MSN, RN, CWOCN, CWCN-AP, CNS Contact Mon-Fri 0700-1500: [phone]

## 2024-04-03 NOTE — Plan of Care (Signed)
  Problem: Clinical Measurements: Goal: Will remain free from infection Outcome: Progressing Goal: Respiratory complications will improve Outcome: Progressing Goal: Cardiovascular complication will be avoided Outcome: Progressing   Problem: Skin Integrity: Goal: Risk for impaired skin integrity will decrease Outcome: Progressing

## 2024-04-03 NOTE — Progress Notes (Signed)
 PROGRESS NOTE    Marie Pham  FMW:[id number] DOB: [birth year] DOA: 03/17/2024 PCP: Tobie Domino, MD  [id number]  LOS: 16 days   Brief hospital course:   Assessment & Plan: Marie Pham is a 78 y.o. female with PMHx of HTN, chronic back pain, arthritis, OSA, depression, asthma/bronchitis, and baseline limited mobility (ambulates with walker) who presented with poor oral intake and new speech difficulty.  Family reported she appeared to understand but was unable to respond verbally.  Last well-known was >24 hours before arrival.  In ED, she was afebrile with a temp of 98.7 F, RR 18, HR 115, BP 120/99, SpO2 95% on RA, placed on 2 L O2 for comfort.  Initial labs showed mild polycythemia, hyponatremia (NA 147), Troponins were negative, mildly elevated D-dimer to 0.92.  EKG showed sinus tachycardia, prolonged QTc to 494 ms. CTA head/neck demonstrated an acute left basal ganglia infarct.  She received an NS bolus, Cardizem , and aspirin  suppository.  She was admitted for stroke management.   MRI (11/22) confirmed bilateral ACA territory infarcts with cytotoxic edema (L >R).  TTE (11/22) showed LVEF 60-65%, G3DD, no atrial level shunt detected by color-flow Doppler. She failed swallow eval and underwent dobhoff feeding tube placemenet by IR on 11/24. Shortly after admission she became progressively more somnolent and then minimally responsive.  EEG (11/26) showed generalized cortical dysfunction without seizures. Repeat MRI (11/27) showed further evolution of the bilateral ACA strokes without hemorrhagic transformation.  Neurology was consulted and advised that prognosis of meaningful neurologic recovery is extremely poor. She underwent PEG tube placement on 12/3.    Her course was complicated by recurrent fevers and persistent tachypnea.  There was concern for aspiration on 11/27 and she was started on Rocephin  and Flagyl .  Fevers recurred, prompting ID involvement.  CXR repeated and demonstrated low  lung volumes and bronchovascular crowding without focal consolidation.  1 of 2 blood cultures (12/1) was positive for MRSE (likely contaminant), and repeat cultures were sent (12/2) prior to antibiotic change which showed no growth.  Antibiotics were escalated to cefepime  plus vancomycin , later changed to meropenem  due to concern for cefepime  worsening encephalopathy.  C. difficile PCR remained negative.  Given extensive bifrontal injury and cytotoxic edema, ID felt some fevers were likely central.    Neurologically, she has remained severely encephalopathic.  She intermittently opens her eyes to voice, and per daughter had occasional spontaneous eye opening and brief tracking, but no sustained purposeful responsiveness.   Prognosis remains extremely guarded.    12/6 - no fevers, tachycardia improved, tachypnea persistent. WBC improving. Meropenem  discontinued given negative CT chest/abd/pelvis. Hyperkalemia to 5.2, resolved with Lokelma  to 4.9 on repeat. TF adjusted to Osmolite 1.2, banatrol discontinued. Facility has not yet received air mattress or tube feed pump.   12/7 - no fevers, WBC continues to improve. Na 147, K 5.1 , Mg 2.8. 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 - PEG placed 12/3, internal nutrition per PEG started per RD - Continue aspirin , Lipitor , Plavix  (resumed 1 day after PEG placement) - PT OT evaluated, recommended SNF - Palliative care following - Neurology reengaged - recommended CT head  noncon to rule out increased ICP - CT head noncon  showed evolving bilateral ACA infarcts without hemorrhaging transformation or mass effect, chronic small vessel ischemic white matter changes and lacunar infarct of right thalamus   Recurrent fevers  SIRS Possible aspiration pneumonitis +/- central fever / sympathetic hyperactivity - CTA chest (11/22) with bilateral pneumonitis likely aspiration related, no PE - Treated initially with Rocephin  and azithromycin , later Rocephin  and Flagyl  after concerns for aspiration overnight on 11/27 - Had recurrent fevers > 101 F on 12/1 with leukocytosis, blood cultures were obtained - CXR without focal infectious source - ID was consulted - Blood cultures (12/1) 1 of 2 positive for MRSE, likely contaminant.  Blood cultures repeated on 12/2 prior to starting vancomycin  - Antibiotics escalated to cefepime  plus vancomycin , later revised to meropenem  per ID on 12/2 - C. Diff PCR negative - Blood cultures (12/2) NGTD - UA (12/3) negative  - On 12/4. continued to be febrile with Tmax 102.9 F and leukocytosis to 18.1 - Neurology reengaged due to concern for central fever, recommended scheduled Tylenol , propranolol  10 mg TID, gradual taper off Coreg , cooling blankets. Recommended reconsult if no improvement in fevers in 3 days.  - ID recommended obtaining LE doppler to rule out DVT, possibly followed by CT c/a/p if negative - LE doppler negative for DVTs bilaterally - Vancomycin  discontinued on 12/4 - CT chest/abd/pelvis with contrast unrevealing of source of infection - Meropenem  discontinued on 12/5 - Afebrile thus far, leukocytosis improving to 11.8   Tachypnea  Tachycardia - Etiology unclear, possibly related to central fevers and possible sympathetic hyperactivity/storming - Propranalol 10 mg TID started per neurology's recommendation  --increase propranolol  to 20 mg TID  Hyperkalemia --d/c losartan  --monitor   Dysphagia secondary to CVA - PEG tube placed on 12/3 - RD following for continuation  of tube feeds and PRN adjustment   Hypernatremia 2/2 free water  deficit --normalized today  --free water  via tube feed   Severe aortic stenosis  grade 3 diastolic dysfunction - Stable hemodynamics - Will need outpatient follow-up with cardiologist if feasible   Hypertension - Propranolol  started per neurology for possible central fevers / sympathetic hyperactivity.  Coreg  d/c'ed --increase propranolol  to 20 mg TID --d/c losartan  due to hyperkalemia   Chronic back pain/lumbar radiculitis - Supportive management - Currently patient is unresponsive and unable to indicate pain level   Hyperglycemia - Hgb A1c 6.1 (11/22) --BG q4h   Sacral pressure wound   - Air mattress in place - Change position every 2 hour - Wound care consulted and following    DVT prophylaxis: Lovenox  SQ Code Status: DNR  Family Communication: grand-daughter updated at bedside today Level of care: Progressive Dispo:   The patient is from: home Anticipated d/c is to: SNF Anticipated d/c date is: tomorrow   Subjective and Interval History:  Per family at bedside, pt appeared better.     Objective: Vitals:   04/03/24 1530 04/03/24 1545 04/03/24 1715 04/03/24 1716  BP:   (!) 126/57   Pulse:      Resp: (!) 29 (!) 30 (!) 41 (!) 26  Temp:   98.2 F (36.8 C)   TempSrc:   Oral   SpO2:      Weight:      Height:        Intake/Output Summary (Last 24 hours) at 04/03/2024 1847 Last data filed at 04/03/2024 1536 Gross per 24 hour  Intake 2464 ml  Output --  Net 2464 ml   Marie Pham  Weights   03/29/24 1500 03/30/24 0500  Weight: 85.8 kg 85.2 kg    Examination:   Constitutional: NAD CV: No cyanosis.   RESP: normal respiratory effort, on 2L Rectal tube present   Data Reviewed: I have personally reviewed labs and imaging studies  Time spent: 50 minutes  Ellouise Haber, MD [facility] If 7PM-7AM, please contact night-coverage 04/03/2024, 6:47 PM

## 2024-04-04 LAB — BASIC METABOLIC PANEL WITH GFR
Anion gap: 5 (ref 5–15)
BUN: 34 mg/dL — ABNORMAL HIGH (ref 8–23)
CO2: 39 mmol/L — ABNORMAL HIGH (ref 22–32)
Calcium: 9.2 mg/dL (ref 8.9–10.3)
Chloride: 96 mmol/L — ABNORMAL LOW (ref 98–111)
Creatinine, Ser: 0.39 mg/dL — ABNORMAL LOW (ref 0.44–1.00)
GFR, Estimated: >60 mL/min (ref >60)
Glucose, Bld: 126 mg/dL — ABNORMAL HIGH (ref 70–99)
Potassium: 5.2 mmol/L — ABNORMAL HIGH (ref 3.5–5.1)
Sodium: 140 mmol/L (ref 135–145)

## 2024-04-04 LAB — GLUCOSE, CAPILLARY
Glucose-Capillary: 147 mg/dL — ABNORMAL HIGH (ref 70–99)
Glucose-Capillary: 156 mg/dL — ABNORMAL HIGH (ref 70–99)
Glucose-Capillary: 100 mg/dL — ABNORMAL HIGH (ref 70–99)
Glucose-Capillary: 179 mg/dL — ABNORMAL HIGH (ref 70–99)

## 2024-04-04 LAB — MAGNESIUM: Magnesium: 2.6 mg/dL — ABNORMAL HIGH (ref 1.7–2.4)

## 2024-04-04 MED ORDER — ADULT MULTIVITAMIN W/MINERALS CH: 1.0000 | ORAL_TABLET | Freq: Every day | ORAL | Status: AC

## 2024-04-04 MED ORDER — ASPIRIN 81 MG PO CHEW: 81.0000 mg | CHEWABLE_TABLET | Freq: Every day | ORAL | Status: AC

## 2024-04-04 MED ORDER — ZINC SULFATE 220 (50 ZN) MG PO CAPS: 220.0000 mg | ORAL_CAPSULE | Freq: Every day | ORAL | Status: AC

## 2024-04-04 MED ORDER — PROPRANOLOL HCL 20 MG PO TABS: 20.0000 mg | ORAL_TABLET | Freq: Three times a day (TID) | ORAL | Status: AC

## 2024-04-04 MED ORDER — OSMOLITE 1.2 CAL PO LIQD: 1000.0000 mL | ORAL | Status: AC

## 2024-04-04 MED ORDER — JUVEN PO PACK: 1.0000 | PACK | Freq: Two times a day (BID) | ORAL | Status: AC

## 2024-04-04 MED ORDER — ASPIRIN 81 MG PO CHEW: 81.0000 mg | CHEWABLE_TABLET | Freq: Every day | ORAL | Status: DC

## 2024-04-04 MED ORDER — PROSOURCE TF20 ENFIT COMPATIBL EN LIQD: 60.0000 mL | Freq: Every day | ENTERAL | Status: AC

## 2024-04-04 MED ORDER — CLOPIDOGREL BISULFATE 75 MG PO TABS: 75.0000 mg | ORAL_TABLET | Freq: Every day | ORAL | Status: AC

## 2024-04-04 MED ORDER — ASCORBIC ACID 500 MG PO TABS: 500.0000 mg | ORAL_TABLET | Freq: Two times a day (BID) | ORAL | Status: AC

## 2024-04-04 MED ORDER — FREE WATER: 300.0000 mL | Status: AC

## 2024-04-04 MED ORDER — ATORVASTATIN CALCIUM 80 MG PO TABS: 80.0000 mg | ORAL_TABLET | Freq: Every day | ORAL | Status: AC

## 2024-04-04 MED ORDER — COLLAGENASE 250 UNIT/GM EX OINT: TOPICAL_OINTMENT | Freq: Every day | CUTANEOUS | Status: AC

## 2024-04-04 MED ORDER — ATORVASTATIN CALCIUM 80 MG PO TABS: 80.0000 mg | ORAL_TABLET | Freq: Every day | ORAL | Status: DC

## 2024-04-04 NOTE — Progress Notes (Addendum)
 Per son's request on 12/03, son does not want his mother repositioned every 2 hours at night and prefers position changing intervention. Patient had a complete bed change and buttock dressing  at 1930. Patient's arms repositioned at 2100. VS  obtained at 2350, patients daughter stated that patient had not been positioned since 1930. I informed her that per her brothers request that he did not want the patient to be repositioned every 2 hours and to continue with position changing intervention on bed. Patient was repositioned, patient clean and dry. Patients daughter called stating the tube feeding machine was making noise. New tubing and Osmolite 1.2 hung at 0220. Patient again repositioned, patient dry.

## 2024-04-04 NOTE — Progress Notes (Signed)
 Bed bath completed, complete bed change. TX completed to buttock and peg tube. Patient repositioned. Tolerated tube feeding without difficulty throughout the evening.

## 2024-04-04 NOTE — TOC Transition Note (Signed)
 Transition of Care [facility]) - Discharge Note   Patient Details  Name: Marie Pham MRN: [id number] Date of Birth: March 30, 1946  [facility]) CM/SW Contact:  Lauraine JAYSON Carpen, LCSW Phone Number: 04/04/2024, 2:40 PM   Clinical Narrative:   Patient has orders to discharge to [facility] today. RN will call report to [phone] (Room [phone]). [facility] has been arranged and she is 5th on the list. No further concerns. CSW signing off.  Final next level of care: Skilled Nursing Facility Barriers to Discharge: Barriers Resolved   Patient Goals and CMS Choice     Choice offered to / list presented to : Adult Children      Discharge Placement   Existing PASRR number confirmed : 03/31/24          Patient chooses bed at: [facility] Patient to be transferred to facility by: [facility] Name of family member notified: [name] Flatter Patient and family notified of of transfer: 04/04/24  Discharge Plan and Services Additional resources added to the After Visit Summary for       Post Acute Care Choice: Skilled Nursing Facility                               Social Drivers of Health (SDOH) Interventions SDOH Screenings   Food Insecurity: Patient Unable To Answer (03/18/2024)  Housing: Patient Unable To Answer (03/18/2024)  Transportation Needs: Patient Unable To Answer (03/18/2024)  Utilities: Patient Unable To Answer (03/18/2024)  Financial Resource Strain: High Risk (09/01/2023)   Received from [facility]  Social Connections: Patient Unable To Answer (03/18/2024)  Tobacco Use: Medium Risk (03/17/2024)     Readmission Risk Interventions     No data to display

## 2024-04-04 NOTE — TOC Progression Note (Addendum)
 Transition of Care [facility]) - Progression Note    Patient Details  Name: Marie Pham MRN: [id number] Date of Birth: 12-Jan-1946  Transition of Care [facility]) CM/SW Contact  Lauraine JAYSON Carpen, LCSW Phone Number: 04/04/2024, 9:27 AM  Clinical Narrative:  CSW left voicemail for Clotilda at [facility] to check the status of the delivery for the tube feed supplies.  11:43 am: [employer] again. No answer. Did not leave a second voicemail. Left message for the admissions coordinator on their short-term rehab side to confirm Clotilda is there today.  11:47 am: Spoke to Shannon and Lindsay at [facility]. Tube feed supplies were just delivered. They can accept patient today and need discharge summary before 3:00. CSW sent secure chat to MD to notify.  1:33 pm: Discharge paperwork won't go through on fax. Sent to [facility] in a secure email.  Expected Discharge Plan: Skilled Nursing Facility Barriers to Discharge: Continued Medical Work up               Expected Discharge Plan and Services     Post Acute Care Choice: Skilled Nursing Facility Living arrangements for the past 2 months: Single Family Home                                       Social Drivers of Health (SDOH) Interventions SDOH Screenings   Food Insecurity: Patient Unable To Answer (03/18/2024)  Housing: Patient Unable To Answer (03/18/2024)  Transportation Needs: Patient Unable To Answer (03/18/2024)  Utilities: Patient Unable To Answer (03/18/2024)  Financial Resource Strain: High Risk (09/01/2023)   Received from [facility]  Social Connections: Patient Unable To Answer (03/18/2024)  Tobacco Use: Medium Risk (03/17/2024)    Readmission Risk Interventions     No data to display

## 2024-04-04 NOTE — Progress Notes (Signed)
 Occupational Therapy Treatment Patient Details Name: Marie Pham MRN: [id number] DOB: [birth year] Today's Date: 04/04/2024   History of present illness Pt is a 78 y/o female presenting with aphasia and R sided weakness with last known well over 24 hours prior. 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 Chest Xray showed atelectasis in L and R lungs. PMH: HTN, arthritis, chronic back pain, aortic stenosis.   OT comments  Upon entering the room, pt supine in bed with supportive son present in room. He did report his mother had eyes opened at one time and tracked him in room. Pt does not open eyes during session but does groan once. OT utilized suction toothbrush for oral care. Pt allowed but then began to bite in mouth and unable to clean her mouth further. Total A to cleanse face with no further alertness noted. OT also utilized wet cloth to wash hands and apply lotion and then PROM to B UEs in all planes of movement x 5 reps each. Pt remains in bed with son in room and call bell and all other needs within reach. No progressing towards goals this session and no further alertness noted.       If plan is discharge home, recommend the following:  Two people to help with walking and/or transfers;Two people to help with bathing/dressing/bathroom;Direct supervision/assist for medications management;Assistance with cooking/housework;Direct supervision/assist for financial management;Assist for transportation;Help with stairs or ramp for entrance;Supervision due to cognitive status   Equipment Recommendations  Hospital bed;Hoyer lift    Recommendations for Other Services      Precautions / Restrictions Precautions Precautions: Fall Recall of Precautions/Restrictions: Impaired Precaution/Restrictions Comments: NGT       Mobility Bed Mobility Overal bed mobility: Needs Assistance             General bed mobility comments: to  reposition head in bed    Transfers                         Balance                                           ADL either performed or assessed with clinical judgement   ADL Overall ADL's : Needs assistance/impaired     Grooming: Total assistance;Oral care;Bed level                                      Extremity/Trunk Assessment              Vision       Perception     Praxis     Communication Communication Communication: Impaired Factors Affecting Communication: Other (comment) (non verbal)   Cognition Arousal: Stuporous Behavior During Therapy: Flat affect Cognition: Difficult to assess Difficult to assess due to: Level of arousal           OT - Cognition Comments: lethargic, flat affect, eyes closed for session, no command following                 Following commands: Impaired        Cueing   Cueing Techniques: Verbal cues, Gestural cues, Tactile cues  Exercises      Shoulder Instructions  General Comments      Pertinent Vitals/ Pain       Pain Assessment Pain Assessment: PAINAD Breathing: normal Negative Vocalization: occasional moan/groan, low speech, negative/disapproving quality Facial Expression: smiling or inexpressive Body Language: relaxed Consolability: no need to console PAINAD Score: 1  Home Living                                          Prior Functioning/Environment              Frequency  Min 1X/week        Progress Toward Goals  OT Goals(current goals can now be found in the care plan section)  Progress towards OT goals: Not progressing toward goals - comment     Plan      Co-evaluation                 AM-PAC OT 6 Clicks Daily Activity     Outcome Measure   Help from another person eating meals?: Total Help from another person taking care of personal grooming?: Total Help from another person toileting, which  includes using toliet, bedpan, or urinal?: Total Help from another person bathing (including washing, rinsing, drying)?: Total Help from another person to put on and taking off regular upper body clothing?: Total Help from another person to put on and taking off regular lower body clothing?: Total 6 Click Score: 6    End of Session    OT Visit Diagnosis: Other symptoms and signs involving the nervous system (R29.898);Other symptoms and signs involving cognitive function;Muscle weakness (generalized) (M62.81);Repeated falls (R29.6);Hemiplegia and hemiparesis   Activity Tolerance Patient limited by lethargy   Patient Left in bed;with call bell/phone within reach;with family/visitor present   Nurse Communication Mobility status        Time: 8857-8789 OT Time Calculation (min): 28 min  Charges: OT General Charges $OT Visit: 1 Visit OT Treatments $Self Care/Home Management : 8-22 mins $Therapeutic Activity: 8-22 mins  Izetta Claude, MS, OTR/L , CBIS ascom [phone]  04/04/24, 12:26 PM

## 2024-04-04 NOTE — Discharge Summary (Addendum)
 Physician Discharge Summary   Marie Pham  female DOB: 25-Nov-1945  FMW:[id number]  PCP: Marie Domino, MD  Admit date: 03/17/2024 Discharge date: 04/04/2024  Admitted From: home Disposition:  SNF  Family updated at bedside prior to discharge. CODE STATUS: DNR  Discharge Instructions     Ambulatory referral to Neurology   Complete by: As directed    Discharge wound care:   Complete by: As directed    Wound care  Daily      Comments: Cleanse sacrum/coccyx/buttocks Q day with Vashe wound cleanser [name] [phone]) do not rinse. Apply Santyl  to the left buttock wound (non viable tissue) cover this area and entire wound with xeroform and secure with silicone foam or ABD pad and tape whichever is preferred  Wound care  2 times daily      Comments: Cleanse underneath breasts with soap and water , dry. Apply strips of silver hydrofiber [name] [phone]) Aquacel AG to open wounds beds daily then apply Interdry Ag as follows:  Order Gerlean # [phone] Measure and cut length of InterDry to fit in skin folds that have skin breakdown Tuck InterDry fabric into skin folds in a single layer, allow for 2 inches of overhang from skin edges to allow for wicking to occur May remove to bathe; dry area thoroughly and then tuck into affected areas again Do not apply any creams or ointments when using InterDry DO NOT THROW AWAY FOR 5 DAYS unless soiled with stool DO NOT [facility] product, this will inactivate the silver in the material  New sheet of Interdry should be applied after 5 days of use if patient continues to have skin breakdown [facility] Course:  For full details, please see H&P, progress notes, consult notes and ancillary notes.  Briefly,  Marie Pham is a 78 y.o. female with PMHx of HTN, chronic back pain, arthritis, OSA, depression, asthma/bronchitis, and baseline limited mobility (ambulates with walker) who presented with poor oral intake and new speech difficulty.    Family  reported she appeared to understand but was unable to respond verbally.  Last well-known was >24 hours before arrival.  CTA head/neck demonstrated an acute left basal ganglia infarct.     MRI (11/22) confirmed bilateral ACA territory infarcts with cytotoxic edema (L >R).  TTE (11/22) showed LVEF 60-65%, G3DD, no atrial level shunt detected by color-flow Doppler. She failed swallow eval and underwent dobhoff feeding tube placemenet by IR on 11/24. Shortly after admission she became progressively more somnolent and then minimally responsive.  EEG (11/26) showed generalized cortical dysfunction without seizures. Repeat MRI (11/27) showed further evolution of the bilateral ACA strokes without hemorrhagic transformation.  Neurology was consulted and advised that prognosis of meaningful neurologic recovery is extremely poor. She underwent PEG tube placement on 12/3.    Her course was complicated by recurrent fevers and persistent tachypnea.  There was concern for aspiration on 11/27 and she was started on Rocephin  and Flagyl .  Fevers recurred, prompting ID involvement.  CXR repeated and demonstrated low lung volumes and bronchovascular crowding without focal consolidation.  1 of 2 blood cultures (12/1) was positive for MRSE (likely contaminant), and repeat cultures were sent (12/2) prior to antibiotic change which showed no growth.  Antibiotics were escalated to cefepime  plus vancomycin , later changed to meropenem  due to concern for cefepime  worsening encephalopathy.  C. difficile PCR remained negative.  Given extensive bifrontal injury and cytotoxic edema, ID felt some fevers were likely central.  Neurologically, she has remained severely encephalopathic.  She intermittently opens her eyes to voice, and per daughter had occasional spontaneous eye opening and brief tracking, but no sustained purposeful responsiveness.   Prognosis remains extremely guarded.    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 - PEG placed 12/3, started on tube feed. - Continue ASA 81 and plavix  75 together until outpatient neuro followup.  Home Wellbutrin, Paxil or Abilify held during hospitalization and d/c'ed at discharge since pt likely had significant change in personality with these multiple acute infarcts. In particular, Wellbutrin has the highest possibility of decreasing seizure threshold .  (Both rec given by oncall neurologist on the day of discharge). --started on Lipitor  80 mg daily --outpatient f/u with neurology.   Aspiration PNA and pneumonitis - CTA chest (11/22) with bilateral pneumonitis likely aspiration related, no PE.  Received 5 days of ceftriaxone  and azithromycin .  Recurrent fevers  Likely central fevers --after a course of abx with ceftriaoxne and azithro, pt continued to have fevers.  ID consulted.  no infectious source found.  Cdiff neg.  No vegetations on the valves on 2 d echo. buttock wound clean not infected.  UA neg.  No evidence joint infection. Neg LE doppler.  CT chest abd pelvis neg.  Pt received 7 more days of broad-spectrum empiric abx, then stopped by ID. - Neurology reengaged due to concern for central fever, who rec starting propranolol  and tapering off home coreg .  Pt discharged on propranolol  20 mg TID.  Tachypnea  Tachycardia - Etiology unclear, possibly related to central fevers and possible sympathetic hyperactivity/storming - Propranalol 10 mg TID started per neurology's recommendation and increased to propranolol  to 20 mg TID   Hyperkalemia --d/c'ed losartan    Dysphagia secondary to CVA - PEG  tube placed on 12/3 --cont feeding supplements as below.   Hypernatremia 2/2 free water  deficit --normalized  --free water  via tube feed   Severe aortic stenosis  Chronic dCHF - Stable hemodynamics   Hypertension - Propranolol  started per neurology for possible central fevers / sympathetic hyperactivity.  Coreg  d/c'ed --pt not taking Lisinopril PTA. --d/c'ed losartan  due to hyperkalemia   Chronic back pain/lumbar radiculitis - Supportive management   Hyperglycemia - Hgb A1c 6.1 (11/22)   Sacral pressure wound   - Air mattress in place - Change position every 2 hour - Wound care consulted.  Wound care and dressing change instruction listed above.   Unless noted above, medications under STOP list are ones pt was not taking PTA.  Discharge Diagnoses:  Principal Problem:   Acute CVA (cerebrovascular accident) (HCC) Active Problems:   AKI (acute kidney injury)   Hypernatremia   SIRS (systemic inflammatory response syndrome) (HCC)   Abnormal EKG   Essential hypertension   Severe aortic stenosis   Pneumonitis   Fever   30 Day Unplanned Readmission Risk Score    Flowsheet Row ED to Hosp-Admission (Current) from 03/17/2024 in [facility] PCU  30 Day Unplanned Readmission Risk Score (%) 19.7 Filed at 04/04/2024 1200    This score is the patient's risk of an unplanned readmission within 30 days of being discharged (0 -100%). The score is based on dignosis, age, lab data, medications, orders, and  past utilization.   Low:  0-14.9   Medium: 15-21.9   High: 22-29.9   Extreme: 30 and above         Discharge Instructions:  Allergies as of 04/04/2024       Reactions   Lansoprazole Nausea And Vomiting   Penicillins Hives   Prozac [fluoxetine] Hives   Bupropion Rash        Medication List     STOP taking these medications    ARIPiprazole 2 MG tablet Commonly known as: ABILIFY   buPROPion 150 MG 24 hr tablet Commonly known as: WELLBUTRIN  XL   celecoxib 200 MG capsule Commonly known as: CELEBREX   Fluticasone-Salmeterol 250-50 MCG/DOSE Aepb Commonly known as: ADVAIR   hydrochlorothiazide  12.5 MG tablet Commonly known as: HYDRODIURIL    lisinopril 10 MG tablet Commonly known as: ZESTRIL   losartan  100 MG tablet Commonly known as: COZAAR    losartan  50 MG tablet Commonly known as: COZAAR    naproxen 500 MG tablet Commonly known as: NAPROSYN   PARoxetine 20 MG tablet Commonly known as: PAXIL   QUEtiapine  200 MG tablet Commonly known as: SEROQUEL    sucralfate  1 g tablet Commonly known as: Carafate    Symbicort 160-4.5 MCG/ACT inhaler Generic drug: budesonide-formoterol   venlafaxine  XR 150 MG 24 hr capsule Commonly known as: EFFEXOR -XR       TAKE these medications    albuterol  108 (90 Base) MCG/ACT inhaler Commonly known as: VENTOLIN  HFA Inhale into the lungs.   ascorbic acid  500 MG tablet Commonly known as: VITAMIN C  Place 1 tablet (500 mg total) into feeding tube 2 (two) times daily.   aspirin  81 MG chewable tablet Place 1 tablet (81 mg total) into feeding tube daily. Start taking on: April 05, 2024   atorvastatin  80 MG tablet Commonly known as: LIPITOR  Place 1 tablet (80 mg total) into feeding tube daily. Start taking on: April 05, 2024   clopidogrel  75 MG tablet Commonly known as: PLAVIX  Place 1 tablet (75 mg total) into feeding tube daily. Start taking on: April 05, 2024   collagenase  250 UNIT/GM ointment Commonly known as: SANTYL  Apply topically daily. Start taking on: April 05, 2024   Combigan 0.2-0.5 % ophthalmic solution Generic drug: brimonidine-timolol Apply 1 drop to eye every 12 (twelve) hours.   famotidine  40 MG tablet Commonly known as: PEPCID  Take 1 tablet (40 mg total) by mouth every evening.   feeding supplement (OSMOLITE 1.2 CAL) Liqd Place 1,000 mLs into feeding tube continuous.   nutrition supplement (JUVEN) Pack Place 1 packet into feeding tube  2 (two) times daily between meals.   feeding supplement (PROSource TF20) liquid Place 60 mLs into feeding tube daily. Start taking on: April 05, 2024   free water  Soln Place 300 mLs into feeding tube every 4 (four) hours.   latanoprost  0.005 % ophthalmic solution Commonly known as: XALATAN  Place 1 drop into both eyes at bedtime.   multivitamin with minerals Tabs tablet Place 1 tablet into feeding tube daily. Start taking on: April 05, 2024   Muscle Rub 10-15 % Crea Apply 1 Application topically as needed.   propranolol  20 MG tablet Commonly known as: INDERAL  Place 1 tablet (20 mg total) into feeding tube 3 (three) times daily.   Vitamin D3 1.25 MG (50000 UT) Caps Take 1 capsule by mouth once a week.   zinc  sulfate (50mg  elemental zinc ) 220 (50 Zn) MG capsule Place 1 capsule (220 mg total) into feeding tube daily. Start taking on: April 05, 2024  Discharge Care Instructions  (From admission, onward)           Start     Ordered   04/04/24 0000  Discharge wound care:       Comments: Wound care  Daily      Comments: Cleanse sacrum/coccyx/buttocks Q day with Vashe wound cleanser [name] [phone]) do not rinse. Apply Santyl  to the left buttock wound (non viable tissue) cover this area and entire wound with xeroform and secure with silicone foam or ABD pad and tape whichever is preferred  Wound care  2 times daily      Comments: Cleanse underneath breasts with soap and water , dry. Apply strips of silver hydrofiber [name] [phone]) Aquacel AG to open wounds beds daily then apply Interdry Ag as follows:  Order Gerlean # [id number] Measure and cut length of InterDry to fit in skin folds that have skin breakdown Tuck InterDry fabric into skin folds in a single layer, allow for 2 inches of overhang from skin edges to allow for wicking to occur May remove to bathe; dry area thoroughly and then tuck into affected areas again Do not apply any creams or  ointments when using InterDry DO NOT THROW AWAY FOR 5 DAYS unless soiled with stool DO NOT [facility] product, this will inactivate the silver in the material  New sheet of Interdry should be applied after 5 days of use if patient continues to have skin breakdown - -   04/04/24 1236             Follow-up Information     Marie Domino, MD Follow up.   Specialty: Family Medicine Why: hospital follow up Contact information: [address] [place] [address] [phone]         Maree Jannett POUR, MD Follow up in 1 month(s).   Specialty: Neurology Contact information: [address] [facility] [place] [address] [phone]                 Allergies  Allergen Reactions   Lansoprazole Nausea And Vomiting   Penicillins Hives   Prozac [Fluoxetine] Hives   Bupropion Rash     The results of significant diagnostics from this hospitalization (including imaging, microbiology, ancillary and laboratory) are listed below for reference.   Consultations:   Procedures/Studies: CT CHEST ABDOMEN PELVIS W CONTRAST Result Date: 03/31/2024 EXAM: CT CHEST, ABDOMEN AND PELVIS WITH CONTRAST 03/31/2024 12:06:20 PM TECHNIQUE: CT of the chest, abdomen and pelvis was performed with the administration of 100 mL of iohexol  (OMNIPAQUE ) 300 MG/ML solution. Multiplanar reformatted images are provided for review. Automated exposure control, iterative reconstruction, and/or weight based adjustment of the mA/kV was utilized to reduce the radiation dose to as low as reasonably achievable. COMPARISON: CT of the abdomen and pelvis 03/17/2024. CLINICAL HISTORY: Persistent fevers, looking for occult source of infection. FINDINGS: CHEST: MEDIASTINUM AND LYMPH NODES: Heart and pericardium are unremarkable. Coronary artery calcifications are present. Atherosclerotic calcifications are present at the aortic arch and great vessel origins without focal stenosis or  aneurysm. The central airways are clear. No mediastinal, hilar or axillary lymphadenopathy. LUNGS AND PLEURA: The 3 mm pulmonary nodule is present on image 52 of series 4. A 4 mm nodule is noted along the major fissure in the left upper lobe on image 50 of series 4. A 5 mm nodule is present in the right middle lobe on image 61 of series 4. A 4 mm nodule is again noted along the diaphragm in the  right middle lobe measuring 4 mm on image 73 of series 4. A 4 mm nodule is present in the left upper lobe on image 66 of series 4. Mild dependent airspace disease is present in both lungs, increased from the prior study. No pleural effusion or pneumothorax. ABDOMEN AND PELVIS: LIVER: Mild fatty infiltration of the liver is noted. GALLBLADDER AND BILE DUCTS: Gallbladder is unremarkable. No biliary ductal dilatation. SPLEEN: An 8.3 cm cystic lesion in the spleen is stable. PANCREAS: No acute abnormality. ADRENAL GLANDS: No acute abnormality. KIDNEYS, URETERS AND BLADDER: Simple cysts in both kidneys are stable measuring up to 3.5 cm at the lower pole of the left kidney and 3.0 cm posteriorly in the right kidney. Per consensus, no follow-up is needed for simple Bosniak type 1 and 2 renal cysts, unless the patient has a malignancy history or risk factors. No stones in the kidneys or ureters. No hydronephrosis. No perinephric or periureteral stranding. Urinary bladder is unremarkable. GI AND BOWEL: Stomach demonstrates no acute abnormality. Peg tube is in place without complication. Diverticular changes are present in the sigmoid colon without inflammatory change to suggest diverticulitis. A balloon catheter is present in the rectum. There is no bowel obstruction. REPRODUCTIVE ORGANS: Uterus is again noted. PERITONEUM AND RETROPERITONEUM: No ascites. No free air. New presacral edema is present. VASCULATURE: Extensive atherosclerotic changes are present in the abdominal aorta and branch vessels without aneurysm. ABDOMINAL AND  PELVIS LYMPH NODES: No lymphadenopathy. BONES AND SOFT TISSUES: No acute osseous abnormality. New edematous changes are present in the dependent subcutaneous tissues of the lower abdomen and pelvis, right greater than left. No discrete abscess is present. IMPRESSION: 1. No acute findings. 2. Mild dependent airspace disease in both lungs, increased from the prior study. This likely represents atelectasis. 3. Multiple solid pulmonary nodules up to 5 mm without high-risk features; no routine imaging follow-up is recommended per Fleischner Society guidelines. 4. New edematous changes in the dependent subcutaneous tissues of the lower abdomen and pelvis, right greater than left; no discrete abscess. Findings suggest early anasarca. No discrete abscess is present. Electronically signed by: Lonni Necessary MD 03/31/2024 04:38 PM EST RP Workstation: [id number]   US  Venous Img Lower Bilateral (DVT) Result Date: 03/30/2024 CLINICAL DATA:  10027 Tachypnea [id number] Fever [id number] EXAM: BILATERAL LOWER EXTREMITY VENOUS DOPPLER ULTRASOUND TECHNIQUE: Gray-scale sonography with graded compression, as well as color Doppler and duplex ultrasound were performed to evaluate the lower extremity deep venous systems from the level of the common femoral vein and including the common femoral, femoral, profunda femoral, popliteal and calf veins including the posterior tibial, peroneal and gastrocnemius veins when visible. The superficial great saphenous vein was also interrogated. Spectral Doppler was utilized to evaluate flow at rest and with distal augmentation maneuvers in the common femoral, femoral and popliteal veins. COMPARISON:  None Available. FINDINGS: RIGHT LOWER EXTREMITY Common Femoral Vein: No evidence of thrombus. Normal compressibility, respiratory phasicity and response to augmentation. Saphenofemoral Junction: No evidence of thrombus. Normal compressibility and flow on color Doppler imaging. Profunda Femoral Vein:  No evidence of thrombus. Normal compressibility and flow on color Doppler imaging. Femoral Vein: No evidence of thrombus. Normal compressibility, respiratory phasicity and response to augmentation. Popliteal Vein: No evidence of thrombus. Normal compressibility, respiratory phasicity and response to augmentation. Calf Veins: No evidence of thrombus. Normal compressibility and flow on color Doppler imaging. Superficial Great Saphenous Vein: No evidence of thrombus. Normal compressibility. Other Findings:  None. LEFT LOWER EXTREMITY Common Femoral Vein: No evidence of  thrombus. Normal compressibility, respiratory phasicity and response to augmentation. Saphenofemoral Junction: No evidence of thrombus. Normal compressibility and flow on color Doppler imaging. Profunda Femoral Vein: No evidence of thrombus. Normal compressibility and flow on color Doppler imaging. Femoral Vein: No evidence of thrombus. Normal compressibility, respiratory phasicity and response to augmentation. Popliteal Vein: No evidence of thrombus. Normal compressibility, respiratory phasicity and response to augmentation. Calf Veins: No evidence of thrombus. Normal compressibility and flow on color Doppler imaging. Superficial Great Saphenous Vein: No evidence of thrombus. Normal compressibility. Other Findings:  None. IMPRESSION: Negative for deep venous thrombosis within both legs. Electronically Signed   By: Rogelia Myers M.D.   On: 03/30/2024 22:21   CT HEAD WO CONTRAST ( ) Result Date: 03/30/2024 EXAM: CT HEAD WITHOUT CONTRAST 03/30/2024 05:17:31 PM TECHNIQUE: CT of the head was performed without the administration of intravenous contrast. Automated exposure control, iterative reconstruction, and/or weight based adjustment of the mA/kV was utilized to reduce the radiation dose to as low as reasonably achievable. COMPARISON: 03/17/2024. CLINICAL HISTORY: Rule out increased intracranial pressure. FINDINGS: BRAIN AND VENTRICLES: No acute  hemorrhage. 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 Chronic small vessel ischemic changes of the white matter. Chronic lacunar infarct right thalamus. No hydrocephalus. No extra-axial collection. No mass effect or midline shift. ORBITS: No acute abnormality. SINUSES: No acute abnormality. SOFT TISSUES AND SKULL: No acute soft tissue abnormality. No skull fracture. Carotid vascular calcification. IMPRESSION: 1. 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 No hemorrhagic transformation or mass effect. 2. Chronic small vessel ischemic changes of the white matter and chronic lacunar infarct right thalamus. Electronically signed by: Franky Stanford MD 03/30/2024 07:57 PM EST RP Workstation: [id number]   IR GASTROSTOMY TUBE MOD SED Result Date: 03/29/2024 INDICATION: Dysphagia.  Altered mental status EXAM: PERCUTANEOUS GASTROSTOMY TUBE PLACEMENT COMPARISON:  Chest XR, earlier same day.  CT AP, 03/17/2024. MEDICATIONS: The patient was on scheduled IV antibiotics Glucagon  0.5 mg IV CONTRAST:  15 mL of Isovue  300 administered into the gastric lumen. ANESTHESIA/SEDATION: Local anesthetic and single agent sedation was employed during this procedure. A total of fentanyl  25 mcg was administered intravenously. The patient's level of consciousness and vital signs were monitored continuously by radiology nursing throughout the procedure under my direct supervision. FLUOROSCOPY: Radiation Exposure Index and estimated peak skin dose (PSD); Reference air kerma (RAK), 7 mGy. COMPLICATIONS: None immediate. PROCEDURE: Informed written consent was obtained from the patient and/or patient's representative following explanation of the procedure, risks, benefits and alternatives. A time out was performed prior to the initiation of the procedure. Maximal barrier sterile technique utilized including caps, mask, sterile gowns, sterile gloves, large sterile drape, hand hygiene and sterile prep. The LEFT  costal margin and barium opacified transverse colon were identified and avoided. Air was injected into the stomach for insufflation and visualization under fluoroscopy. Under sterile conditions and local anesthesia, 2 T tacks were utilized to pexy the anterior aspect of the stomach against the ventral abdominal wall. Contrast injection confirmed appropriate positioning of each of the T tacks. An incision was made between the T tacks and a 17 gauge trocar needle was utilized to access the stomach. Needle position was confirmed within the stomach with aspiration of air and injection of a small amount of contrast. A stiff guidewire was advanced into the gastric lumen and under intermittent fluoroscopic guidance, the access needle was exchanged for a telescoping peel-away sheath, ultimately allowing placement of a 18 Fr balloon retention gastrostomy tube. The retention balloon was insufflated with  a mixture of dilute saline and contrast and pulled taut against the anterior wall of the stomach. The external disc was cinched. Contrast injection confirms positioning within the stomach. Several spot radiographic images were obtained in various obliquities for documentation. The patient tolerated procedure well without immediate post procedural complication. FINDINGS: After successful fluoroscopic guided placement, the gastrostomy tube is appropriately positioned with internal retention balloon against the ventral aspect of the gastric lumen. IMPRESSION: Successful fluoroscopic insertion of a 18 Fr balloon retention gastrostomy tube. The gastrostomy may be used immediately for medication administration and in 4 hrs for the initiation of feeds. RECOMMENDATIONS: The patient will return to Vascular Interventional Radiology (VIR) for routine feeding tube evaluation and exchange in 6 months. Thom Hall, MD [facility]iology Electronically Signed   By: Thom Hall M.D.   On:  03/29/2024 16:05   DG Chest 1 View Result Date: 03/29/2024 EXAM: 1 VIEW(S) XRAY OF THE CHEST 03/29/2024 01:37:12 AM COMPARISON: 03/27/2024 CLINICAL HISTORY: Dyspnea FINDINGS: LINES, TUBES AND DEVICES: Enteric tube in place, coursing through chest to abdomen beyond field-of-view. LUNGS AND PLEURA: Low lung volumes with bronchovascular crowding. No pleural effusion. No pneumothorax. No focal consolidation. HEART AND MEDIASTINUM: Aortic atherosclerosis. BONES AND SOFT TISSUES: No acute osseous abnormality. IMPRESSION: 1. Pulmonary hypoinflation. Electronically signed by: Dorethia Molt MD 03/29/2024 01:43 AM EST RP Workstation: [id number]   DG Abd 1 View Result Date: 03/27/2024 EXAM: 1 VIEW XRAY OF THE ABDOMEN 03/27/2024 01:50:00 PM COMPARISON: 03/23/2024, CT 03/17/2024. CLINICAL HISTORY: Fever. FINDINGS: BOWEL: Nonobstructive bowel gas pattern. SOFT TISSUES: Large caliber catheter projects over the lower pelvis inferior to the symphysis pubis joint. Atherosclerotic calcifications present. There is a well defined 6 cm oval density projecting over the right side of the pelvis with associated more dense rectangular structures centrally superimposed on this density of uncertain clinical significance. No opaque urinary calculi. 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 Visualized left hip arthroplasty unchanged. No acute osseous abnormality. IMPRESSION: 1. No acute abnormality identified. 2. Large-bore pelvic catheter projects over the lower pelvis of uncertain clinical significance . Radiodensity over the right pelvis likely foreign body and of uncertain clinical significance. Electronically signed by: Toribio Agreste MD 03/27/2024 05:49 PM EST RP Workstation: [id number]   DG Chest Port 1 View Result Date: 03/27/2024 EXAM: 1 VIEW(S) XRAY OF THE CHEST 03/27/2024 01:50:00 PM COMPARISON: 03/21/2024 CLINICAL HISTORY: Fever FINDINGS: LINES, TUBES AND DEVICES: Enteric tube in place with tip in proximal  stomach. LUNGS AND PLEURA: Lungs are hypoinflated. Linear density of the left base/retrocardiac region compatible with atelectasis. Possible small left pleural effusion. No pneumothorax. No other focal pulmonary opacity. HEART AND MEDIASTINUM: Atherosclerotic calcifications of aortic arch. No acute abnormality of the cardiac and mediastinal silhouettes. BONES AND SOFT TISSUES: No acute osseous abnormality. IMPRESSION: 1. Hypoinflation with linear density at the left base/retrocardiac region compatible with atelectasis. 2. Enter tube unchanged. Electronically signed by: Toribio Agreste MD 03/27/2024 05:42 PM EST RP Workstation: [id number]   DG Naso G Tube Plc W/Fl W/Rad Result Date: 03/27/2024 INDICATION: Protein calorie malnutrition s/p Dobbhoff placement on 03/20/2024 that was unintentionally removed earlier today. EXAM: NASO G TUBE PLACEMENT WITH FL AND WITH RAD FLUOROSCOPY TIME:  Radiation Exposure Index (as provided by the fluoroscopic device): 8.3 mGy Kerma COMPLICATIONS: None immediate PROCEDURE: The Dobbhoff tube was lubricated with viscous lidocaine  inserted into the right nostril. Under intermittent fluoroscopic guidance, the Dobbhoff tube was advanced through the stomach, through the duodenum with tip ultimately terminating in the  descending duodenum. A spot fluoroscopic image was saved for documentation purposes. The tube was affixed to the patient's nose with tape. The patient tolerated the procedure well without immediate postprocedural complication. IMPRESSION: Successful fluoroscopic guided placement of Dobbhoff tube with tip terminating in the descending duodenum. The tube is ready for immediate use. This exam was performed by [facility] PA-C, and was supervised and interpreted by Dr. VEAR Lent. Electronically Signed   By: Wilkie Lent M.D.   On: 03/27/2024 16:27   DG Abd 1 View Result Date: 03/23/2024 EXAM: 1 VIEW XRAY OF THE ABDOMEN 03/23/2024 05:54:51 PM COMPARISON: Comparison  with 03/20/2024. CLINICAL HISTORY: [id number] Encounter for care related to feeding tube [id number] Encounter for care related to feeding tube FINDINGS: LIMITATIONS/ARTIFACTS: Limited field of view for tube placement verification purposes. LINES, TUBES AND DEVICES: An enteric tube is present with the tip projecting over the upper mid-abdomen, consistent with a location in the body of the stomach. BOWEL: Nonobstructive bowel gas pattern. SOFT TISSUES: No opaque urinary calculi. BONES: No acute osseous abnormality. IMPRESSION: 1. Enteric tube tip projects over the upper mid-abdomen, consistent with a location in the body of the stomach. Electronically signed by: Elsie Gravely MD 03/23/2024 06:19 PM EST RP Workstation: [id number]   MR BRAIN WO CONTRAST Result Date: 03/23/2024 EXAM: MRI BRAIN WITHOUT CONTRAST 03/23/2024 04:04:32 PM TECHNIQUE: Multiplanar multisequence MRI of the head/brain was performed without the administration of intravenous contrast. COMPARISON: Brain MRI 03/18/2024, and earlier exams. CLINICAL HISTORY: 78 year old female with recent bilateral ACA territory infarcts, left greater than right. FINDINGS: BRAIN AND VENTRICLES: Progressed bilaterally ACA territory infarcts and cytotoxic edema, left greater than right. No hemorrhagic transformation. No new areas of acute infarction. New intrinsic T1 hyperintensity within the left caudate, possibly petechial blood. No other acute intracranial hemorrhage. No intracranial mass effect or midline shift. No hydrocephalus. The sella is unremarkable. Visible major vascular flow voids remain stable, including probable maintained patency at the ACA origins. Gyral enlargement more pronounced in the left ACA territory, and confluent involvement of the left half of the corpus callosum as seen on series 17 image 19. Elnor and white matter signal elsewhere is stable including bilateral confluent but nonspecific periventricular white matter T2 and FLAIR hyperintensity. The  left caudate nucleus which appeared edematous and enlarged previously now appears virtually normal (series 8 images 14 and 15). However, there is new intrinsic T1 hyperintensity within the left caudate as seen on series 14 image 84. This might be petechial blood. No malignant hemorrhagic transformation or mass effect. Chronic lacunar infarct at the ventral right thalamus again noted. No convincing involvement of other vascular territories. Chronic small vessel disease. ORBITS: Orbit detail limited today by susceptibility artifact. SINUSES AND MASTOIDS: Paranasal sinus detail limited today by susceptibility artifact. Mild increased bilateral mastoid air cell effusions. BONES AND SOFT TISSUES: Metal susceptibility artifact associated with the face, ethmoid sinuses. Negative visible cervical spine. IMPRESSION: 1. Progressed bilateral ACA territory infarcts with cytotoxic edema, left greater than right. No hemorrhagic transformation. No significant mass effect. 2. Evolution of left caudate infarct with possible petechial hemorrhage, but no malignant hemorrhagic transformation or mass effect. 3. No new vascular territory ischemia. Chronic small vessel disease. Electronically signed by: Helayne Hurst MD 03/23/2024 05:35 PM EST RP Workstation: [id number]   EEG adult Result Date: 03/22/2024 Michaela Aisha SQUIBB, MD     03/23/2024 10:07 AM History: 78 yo F with CVA and ams, EEG to evaluated for seizures EEG Duration: 30 minutes Sedation: none Patient State: Awake and  drowsy Technique: This EEG was acquired with electrodes placed according to the International 10-20 electrode system (including Fp1, Fp2, F3, F4, C3, C4, P3, P4, O1, O2, T3, T4, T5, T6, A1, A2, Fz, Cz, Pz). The following electrodes were missing or displaced: none. Background: The background somewhat disorganized, with pronounced irregular theta and delta range activities.  There is a posterior dominant rhythm of 8 to 8.5 Hz that is well-sustained.  There are  occasional bifrontally predominant discharges with a shifting lateral predominance with triphasic morphology. Photic stimulation: Physiologic driving is not performed EEG Abnormalities: 1) triphasic waves 2) generalized irregular slow activity Clinical Interpretation: This EEG is consistent with a generalized nonspecific cerebral dysfunction, though triphasic waves are nonspecific and they can be associated with toxic/metabolic etiologies of encephalopathy. There was no seizure or seizure predisposition recorded on this study. Please note that lack of epileptiform activity on EEG does not preclude the possibility of epilepsy. Aisha Seals, MD [facility] If 7pm- 7am, please page neurology on call as listed in AMION.  DG Chest Port 1 View Result Date: 03/21/2024 CLINICAL DATA:  Pneumonia EXAM: PORTABLE CHEST 1 VIEW COMPARISON:  Chest radiograph dated 03/18/2024 FINDINGS: Lines/tubes: Gastric/enteric tube tip projects over the stomach. Lungs: Low lung volumes with bronchovascular crowding. No focal consolidation. Pleura: No pneumothorax or pleural effusion. Heart/mediastinum: The heart size and mediastinal contours are within normal limits. Bones: No acute osseous abnormality. IMPRESSION: Low lung volumes with bronchovascular crowding. No focal consolidation. Electronically Signed   By: Limin  Xu M.D.   On: 03/21/2024 08:50   DG Luwana MATSU Tube Plc W/Fl W/Rad Result Date: 03/20/2024 INDICATION: Protein-calorie malnutrition. EXAM: NASO G TUBE PLACEMENT WITH FL AND WITH RAD COMPARISON:  None Available. CONTRAST:  None. FLUOROSCOPY TIME:  Radiation Exposure Index (as provided by the fluoroscopic device): 13.4 mGy Kerma COMPLICATIONS: None. PROCEDURE: The Dobbhoff tube was lubricated with viscous lidocaine  inserted into the right nostril. Under intermittent fluoroscopic guidance, the Dobbhoff tube was advanced through the stomach. However, despite many attempts, the tube was not amenable to  post-pyloric placement. A spot fluoroscopic image was saved for documentation purposes. The tube was affixed to the patient's nose with tape. The patient tolerated the procedure well without immediate postprocedural complication. FINDINGS: Successful fluoroscopic guided placement of Dobbhoff tube with tip within the gastric lumen. IMPRESSION: Successful fluoroscopic guided placement of Dobbhoff tube with tip within the gastric lumen. The tube is ready for immediate use. Performed by: Carlin Griffon, PA-C Supervised and interpreted by: Newell Eke, MD Electronically Signed   By: Newell Eke M.D.   On: 03/20/2024 10:06   CT Angio Chest Pulmonary Embolism (PE) W or WO Contrast Result Date: 03/18/2024 CLINICAL DATA:  Pulmonary embolism suspected, high probability. Shortness of breath. EXAM: CT ANGIOGRAPHY CHEST WITH CONTRAST TECHNIQUE: Multidetector CT imaging of the chest was performed using the standard protocol during bolus administration of intravenous contrast. Multiplanar CT image reconstructions and MIPs were obtained to evaluate the vascular anatomy. RADIATION DOSE REDUCTION: This exam was performed according to the departmental dose-optimization program which includes automated exposure control, adjustment of the mA and/or kV according to patient size and/or use of iterative reconstruction technique. CONTRAST:  75mL OMNIPAQUE  IOHEXOL  350 MG/ML SOLN COMPARISON:  11/09/2022, 01/12/2016. FINDINGS: Cardiovascular: Heart is mildly enlarged and there is a trace pericardial effusion. Scattered coronary artery calcifications are present. There is atherosclerotic calcification of the aorta without evidence of aneurysm. Pulmonary trunk is normal in caliber. No definite evidence of pulmonary embolism is seen. Examination is limited  due to respiratory motion artifact. Mediastinum/Nodes: No mediastinal, hilar, or axillary lymphadenopathy is seen. The thyroid  gland, trachea, and esophagus are within normal limits.  Lungs/Pleura: Hazy ground-glass attenuation is present in the lungs bilaterally. Atelectasis is noted bilaterally. No effusion or pneumothorax is seen. Multiple nodules are noted bilaterally, the largest measuring 4 mm in the right middle lobe, axial image 61, and 4 mm in the left upper lobe, axial image 40, unchanged from 2017 and likely benign. Upper Abdomen: Hyperdense material is present within the gallbladder likely representing excreted contrast from previous examination. Cysts are noted in the right kidney. No acute abnormality. Musculoskeletal: Degenerative changes are present in the thoracic spine. No acute osseous abnormality. Review of the MIP images confirms the above findings. IMPRESSION: 1. No evidence of pulmonary embolism. 2. Ground-glass attenuation in the lungs bilaterally, possible air trapping or pneumonitis. 3. Cardiomegaly with coronary artery calcifications. 4. Aortic atherosclerosis. Electronically Signed   By: Leita Birmingham M.D.   On: 03/18/2024 15:42   ECHOCARDIOGRAM COMPLETE Result Date: 03/18/2024    ECHOCARDIOGRAM REPORT   Patient Name:   Marie Pham Date of Exam: 03/18/2024 Medical Rec #:  [id number]     Height:       65.0 in Accession #:    [id number]    Weight:       184.1 lb Date of Birth:  [birth year]    BSA:          1.910 m Patient Age:    77 years      BP:           140/57 mmHg Patient Gender: F             HR:           119 bpm. Exam Location:  [facility] Procedure: 2D Echo, Cardiac Doppler and Color Doppler (Both Spectral and Color            Flow Doppler were utilized during procedure). Indications:     Stroke I63.9  History:         Patient has no prior history of Echocardiogram examinations.                  Risk Factors:Hypertension.  Sonographer:     Bari Roar Referring Phys:  [id number] DELAYNE LULLA SOLIAN Diagnosing Phys: Denyse Bathe  Sonographer Comments: Technically difficult study due to poor echo windows and patient is obese. Image acquisition challenging due to patient  body habitus and Image acquisition challenging due to respiratory motion. IMPRESSIONS  1. Left ventricular ejection fraction, by estimation, is 60 to 65%. The left ventricle has normal function. The left ventricle has no regional wall motion abnormalities. Left ventricular diastolic parameters are consistent with Grade III diastolic dysfunction (restrictive).  2. Right ventricular systolic function is normal. The right ventricular size is normal.  3. Left atrial size was mildly dilated.  4. Right atrial size was mildly dilated.  5. The mitral valve is normal in structure. Trivial mitral valve regurgitation. No evidence of mitral stenosis.  6. The aortic valve is calcified. Aortic valve regurgitation is trivial. Severe aortic valve stenosis.  7. The inferior vena cava is normal in size with greater than 50% respiratory variability, suggesting right atrial pressure of 3 mmHg. FINDINGS  Left Ventricle: Left ventricular ejection fraction, by estimation, is 60 to 65%. The left ventricle has normal function. The left ventricle has no regional wall motion abnormalities. Strain was performed and the global longitudinal strain is indeterminate. The left  ventricular internal cavity size was normal in size. There is no left ventricular hypertrophy. Left ventricular diastolic parameters are consistent with Grade III diastolic dysfunction (restrictive). Right Ventricle: The right ventricular size is normal. No increase in right ventricular wall thickness. Right ventricular systolic function is normal. Left Atrium: Left atrial size was mildly dilated. Right Atrium: Right atrial size was mildly dilated. Pericardium: There is no evidence of pericardial effusion. Mitral Valve: The mitral valve is normal in structure. Trivial mitral valve regurgitation. No evidence of mitral valve stenosis. Tricuspid Valve: The tricuspid valve is normal in structure. Tricuspid valve regurgitation is trivial. No evidence of tricuspid stenosis. Aortic  Valve: The aortic valve is calcified. Aortic valve regurgitation is trivial. Severe aortic stenosis is present. Aortic valve mean gradient measures 32.5 mmHg. Aortic valve peak gradient measures 57.3 mmHg. Aortic valve area, by VTI measures 0.53 cm. Pulmonic Valve: The pulmonic valve was normal in structure. Pulmonic valve regurgitation is not visualized. No evidence of pulmonic stenosis. Aorta: The aortic root is normal in size and structure. Venous: The inferior vena cava is normal in size with greater than 50% respiratory variability, suggesting right atrial pressure of 3 mmHg. IAS/Shunts: No atrial level shunt detected by color flow Doppler. Additional Comments: 3D was performed not requiring image post processing on an independent workstation and was indeterminate.  LEFT VENTRICLE PLAX 2D LVIDd:         3.60 cm   Diastology LVIDs:         2.40 cm   LV e' medial:    7.15 cm/s LV PW:         1.20 cm   LV E/e' medial:  24.2 LV IVS:        1.70 cm   LV e' lateral:   7.93 cm/s LVOT diam:     1.70 cm   LV E/e' lateral: 21.8 LV SV:         32 LV SV Index:   17 LVOT Area:     2.27 cm  RIGHT VENTRICLE RV S prime:     10.70 cm/s LEFT ATRIUM           Index LA diam:      3.20 cm 1.68 cm/m LA Vol (A4C): 33.6 ml 17.60 ml/m  AORTIC VALVE                     PULMONIC VALVE AV Area (Vmax):    0.76 cm      PV Vmax:        1.56 m/s AV Area (Vmean):   0.62 cm      PV Peak grad:   9.7 mmHg AV Area (VTI):     0.53 cm      RVOT Peak grad: 6 mmHg AV Vmax:           378.50 cm/s AV Vmean:          258.000 cm/s AV VTI:            0.591 m AV Peak Grad:      57.3 mmHg AV Mean Grad:      32.5 mmHg LVOT Vmax:         127.00 cm/s LVOT Vmean:        70.400 cm/s LVOT VTI:          0.139 m LVOT/AV VTI ratio: 0.24  AORTA Ao Root diam: 2.30 cm Ao Asc diam:  2.80 cm MITRAL VALVE  TRICUSPID VALVE MV Area (PHT): 7.59 cm     TR Peak grad:   18.8 mmHg MV Decel Time: 100 msec     TR Vmax:        217.00 cm/s MV E velocity: 173.00  cm/s MV A Prime:    13.7 cm/s    SHUNTS                             Systemic VTI:  0.14 m                             Systemic Diam: 1.70 cm Denyse Bathe Electronically signed by Denyse Bathe Signature Date/Time: 03/18/2024/2:42:42 PM    Final    MR BRAIN WO CONTRAST Result Date: 03/18/2024 EXAM: MRI BRAIN WITHOUT CONTRAST 03/18/2024 12:53:00 PM TECHNIQUE: Multiplanar multisequence MRI of the head/brain was performed without the administration of intravenous contrast. COMPARISON: CT head and CTA head and neck 03/17/2024. Brain MRI 07/17/2023. CLINICAL HISTORY: 78 year old female with acute neuro deficit, stroke suspected. FINDINGS: BRAIN: Confluent restricted diffusion throughout much of the left ACA territory (series 5 image 36, series 7 image 28) with T2 and FLAIR hyperintense cytotoxic edema. Similar intense restricted diffusion also in the anterior and inferior caudate nucleus, corresponding to head CT finding yesterday. Caudate is mildly expanded by cytotoxic edema. No hemorrhagic transformation. There is also subtle contralateral proximal right ACA territory infarct with restricted diffusion and cytotoxic edema affecting the right inferior frontal gyrus (series 5 image 24, series 9 image 24). And Faint right superior frontal gyrus white matter subcentimeter restricted diffusion (series 5 image 39) is noted in the right MCA / ACA watershed territory. Chronic periventricular and scattered cerebral white matter T2 and FLAIR hyperintensity is moderate for age and stable outside of the acute findings. Minimal chronic microhemorrhage suspected in the brain such as left occipital lobe series 12 image 27. Chronic lacunar infarct in the ventral right thalamus. No significant intracranial mass effect. No midline shift. No hydrocephalus. The sella is unremarkable. Normal flow voids. Mediovascular phleboliths at the skull base are preserved. No other diffusion restriction. Brainstem and cerebellum are stable. ORBITS:  No acute abnormality. SINUSES AND MASTOIDS: Stable mild left mastoid effusion. Negative visible nasopharynx. BONES AND SOFT TISSUES: Hyperostosis of the calvarium, normal variant. Normal marrow signal. No acute soft tissue abnormality. Negative visible cervical spine. IMPRESSION: 1. Confluent Left ACA territory acute infarct, including involvement of the inferomedial left caudate nucleus. Additionally, small area of acute infarct in the proximal right ACA territory, and several sub centimeter foci of acute ischemia in the distal right MCA/ACA watershed. 2. Cytotoxic edema with no hemorrhagic transformation or significant mass effect. 3. Elsewhere stable chronic small vessel disease. Electronically signed by: Helayne Hurst MD 03/18/2024 01:04 PM EST RP Workstation: [id number]   DG Chest Port 1 View Result Date: 03/18/2024 EXAM: 1 VIEW(S) XRAY OF THE CHEST 03/18/2024 10:29:00 AM COMPARISON: Radiographs from 07/17/2023. CTA head and neck from 03/17/2024. Radiographs from 03/18/2024 (earlier today). CLINICAL HISTORY: 78 year old female with pneumonia. FINDINGS: LUNGS AND PLEURA: Low lung volumes. Stable linear scarring or subsegmental atelectasis at left base. The right suprahilar opacity appears chronic but increased when compared to radiographs from 07/17/2023. Appearance unchanged from earlier today. CTA neck yesterday negative aside from gas trapping. No pleural effusion. No pneumothorax. HEART AND MEDIASTINUM: Aortic atherosclerosis. No acute abnormality of the cardiac and mediastinal silhouettes. BONES AND SOFT TISSUES: No  acute osseous abnormality. IMPRESSION: 1. Streaky right suprahilar opacity persists, CTA neck yesterday negative aside from gas trapping. Favor atelectasis rather than developing infection. 2. No new cardiopulmonary abnormality. Electronically signed by: Helayne Hurst MD 03/18/2024 10:37 AM EST RP Workstation: [id number]   DG Chest Port 1 View Result Date: 03/18/2024 EXAM: 1 VIEW(S) XRAY OF  THE CHEST 03/18/2024 01:45:55 AM COMPARISON: 07/17/2023 CLINICAL HISTORY: Shortness of breath. FINDINGS: LIMITATIONS/ARTIFACTS: Shallow inspiration. LUNGS AND PLEURA: Linear atelectasis in the left base and right mid lung. No focal consolidation. No pleural effusion. No pneumothorax. HEART AND MEDIASTINUM: Calcification of the aorta. BONES AND SOFT TISSUES: Degenerative changes in the spine and shoulders. IMPRESSION: 1. Linear atelectasis in the left base and right mid lung, which may contribute to shortness of breath. 2. No acute cardiopulmonary process identified. Electronically signed by: Elsie Gravely MD 03/18/2024 01:52 AM EST RP Workstation: [id number]   CT ANGIO HEAD NECK W WO CM Result Date: 03/17/2024 EXAM: CTA Head and Neck with Intravenous Contrast. CT Head without Contrast. CLINICAL HISTORY: Neuro deficit, acute, stroke suspected Neuro deficit, acute, stroke suspected TECHNIQUE: Axial CTA images of the head and neck performed with intravenous contrast. MIP reconstructed images were created and reviewed. Axial computed tomography images of the head/brain performed without intravenous contrast. Note: Per PQRS, the description of internal carotid artery percent stenosis, including 0 percent or normal exam, is based on [employer] Symptomatic Carotid Endarterectomy Trial (NASCET) criteria. Dose reduction technique was used including one or more of the following: automated exposure control, adjustment of mA and kV according to patient size, and/or iterative reconstruction. CONTRAST: With; COMPARISON: CT head November 09, 2023 FINDINGS: CT HEAD: BRAIN: Limited CT head with suggestion of hypodensity in the left basal ganglia (series 2 image 14). No acute intraparenchymal hemorrhage. No mass lesion. No midline shift or extra-axial collection. VENTRICLES: No hydrocephalus. ORBITS: The orbits are unremarkable. SINUSES AND MASTOIDS: The paranasal sinuses and mastoid air cells are clear. CTA NECK: COMMON  CAROTID ARTERIES: No significant stenosis. No dissection or occlusion. INTERNAL CAROTID ARTERIES: No stenosis by NASCET criteria. No dissection or occlusion. VERTEBRAL ARTERIES: Moderate stenosis of the left vertebral artery origin. Occlusion versus severe stenosis of the nondominant distal right intradural vertebral artery. Dominant left vertebral artery is patent without significant stenosis. CTA HEAD: ANTERIOR CEREBRAL ARTERIES: No significant stenosis. No occlusion. No aneurysm. Hypoplastic right A1 ACA. MIDDLE CEREBRAL ARTERIES: Mild to moderate left M1 and proximal M2 MCA stenoses. No occlusion. No aneurysm. POSTERIOR CEREBRAL ARTERIES: Severe bilateral P2 PCA stenoses. No occlusion. No aneurysm. BASILAR ARTERY: No significant stenosis. No occlusion. No aneurysm. OTHER: SOFT TISSUES: No acute finding. No masses or lymphadenopathy. BONES: No acute osseous abnormality. IMPRESSION: 1. Limited CT head with hypodensity in the left basal ganglia that is suspicious for acute/recent infarct. Recommend MRI to assess for acute infarct. 2. 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 3. Severe bilateral P2 PCA stenoses. 4. Moderate left vertebral artery origin stenosis. 5. Mild to moderate left M1 and M2 MCA stenoses. Electronically signed by: Gilmore Molt MD 03/17/2024 09:54 PM EST RP Workstation: [id number]   CT ABDOMEN PELVIS W CONTRAST Result Date: 03/17/2024 EXAM: CT ABDOMEN AND PELVIS WITH CONTRAST 03/17/2024 05:47:17 PM TECHNIQUE: CT of the abdomen and pelvis was performed with the administration of intravenous contrast. 100 mL of iohexol  (OMNIPAQUE ) 300 MG/ML solution was administered. Multiplanar reformatted images are provided for review. Automated exposure control, iterative reconstruction, and/or weight-based adjustment of the mA/kV was utilized to reduce the radiation dose  to as low as reasonably achievable. COMPARISON: None available. CLINICAL HISTORY: LLQ abdominal  pain. FINDINGS: LOWER CHEST: Treatment artifact in the lung bases. 4 mm nodule in the right costophrenic angle. LIVER: Fatty infiltration of the liver. GALLBLADDER AND BILE DUCTS: Gallbladder is unremarkable. No biliary ductal dilatation. SPLEEN: Circumscribed low attenuation lesion in the spleen measuring 3 cm diameter. This is likely a cyst or hemangioma. PANCREAS: No acute abnormality. ADRENAL GLANDS: No acute abnormality. KIDNEYS, URETERS AND BLADDER: Bilateral renal cysts. No stones in the kidneys or ureters. No hydronephrosis. No perinephric or periureteral stranding. Urinary bladder is unremarkable. Per consensus, no follow-up is needed for simple Bosniak type 1 and 2 renal cysts, unless the patient has a malignancy history or risk factors. GI AND BOWEL: Stomach demonstrates no acute abnormality. Diverticulosis of the sigmoid colon. No evidence of acute diverticulitis. There is no bowel obstruction. PERITONEUM AND RETROPERITONEUM: No ascites. No free air. VASCULATURE: Calcification of the aorta. No aneurysm. Aorta is normal in caliber. LYMPH NODES: No lymphadenopathy. REPRODUCTIVE ORGANS: No acute abnormality. BONES AND SOFT TISSUES: Previous left hip arthroplasty. Degenerative changes in the spine. Degenerative changes in the right hip. Port. No acute osseous abnormality. No focal soft tissue abnormality. IMPRESSION: 1. No acute findings. 2. 4 mm right costophrenic angle pulmonary nodule; given incomplete chest coverage, per Fleischner Society Guidelines for incidental nodules 5 mm, no routine follow-up is recommended for low-risk or unknown-risk patients without high-risk nodule features; if high-risk for malignancy or if high-risk nodule features are present, an optional non-contrast chest CT at 12 months may be considered; if performed and stable at 12 months, no further follow-up is needed. Electronically signed by: Elsie Gravely MD 03/17/2024 05:58 PM EST RP Workstation: [id number]       Labs: BNP (last 3 results) No results for input(s): BNP in the last 8760 hours. Basic Metabolic Panel: Recent Labs  Lab 03/29/24 0449 03/30/24 0425 03/31/24 0434 04/01/24 0621 04/01/24 1225 04/02/24 0557 04/03/24 0454 04/04/24 0358  NA 147* 148*  148*   < > 147* 148* 147* 143 140  K 4.5 4.9  4.8   < > 5.2* 4.9 5.1 5.3* 5.2*  CL 107 110  109   < > 109 108 106 101 96*  CO2 32 31  31   < > 34* 35* 37* 38* 39*  GLUCOSE 111* 162*  164*   < > 168* 155* 180* 142* 126*  BUN 22 29*  29*   < > 39* 40* 34* 32* 34*  CREATININE 0.65 0.74  0.74   < > 0.62 0.51 0.52 0.47 0.39*  CALCIUM  9.1 9.1  9.1   < > 9.0 9.2 9.3 10.4* 9.2  MG  --   --   --   --   --  2.8*  --  2.6*  PHOS 4.3 3.3  --   --   --  3.1  --   --    < > = values in this interval not displayed.   Liver Function Tests: Recent Labs  Lab 03/29/24 0449 03/30/24 0425  ALBUMIN 3.2* 3.3*   No results for input(s): LIPASE, AMYLASE in the last 168 hours. No results for input(s): AMMONIA in the last 168 hours. CBC: Recent Labs  Lab 03/29/24 0449 03/30/24 0425 03/31/24 0434 04/01/24 0621 04/02/24 0557 04/03/24 0454  WBC 17.6* 18.1* 15.5* 13.9* 11.8* 10.9*  NEUTROABS 13.5*  --   --   --   --   --   HGB 12.3 12.6 11.8*  11.1* 11.0* 10.6*  HCT 38.5 39.8 38.4 36.2 35.5* 35.2*  MCV 90.4 90.7 93.2 94.3 93.2 93.4  PLT 296 322 314 305 323 337   Cardiac Enzymes: Recent Labs  Lab 03/29/24 0449  CKTOTAL 139   BNP: Invalid input(s): POCBNP CBG: Recent Labs  Lab 04/03/24 2057 04/03/24 2353 04/04/24 0420 04/04/24 0735 04/04/24 1139  GLUCAP 197* 171* 147* 156* 100*   D-Dimer No results for input(s): DDIMER in the last 72 hours. Hgb A1c No results for input(s): HGBA1C in the last 72 hours. Lipid Profile No results for input(s): CHOL, HDL, LDLCALC, TRIG, CHOLHDL, LDLDIRECT in the last 72 hours. Thyroid  function studies No results for input(s): TSH, T4TOTAL, T3FREE,  THYROIDAB in the last 72 hours.  Invalid input(s): FREET3 Anemia work up No results for input(s): VITAMINB12, FOLATE, FERRITIN, TIBC, IRON, RETICCTPCT in the last 72 hours. Urinalysis    Component Value Date/Time   COLORURINE YELLOW (A) 03/30/2024 0345   APPEARANCEUR CLEAR (A) 03/30/2024 0345   APPEARANCEUR Hazy 05/15/2011 1627   LABSPEC 1.019 03/30/2024 0345   LABSPEC 1.011 05/15/2011 1627   PHURINE 5.0 03/30/2024 0345   GLUCOSEU NEGATIVE 03/30/2024 0345   GLUCOSEU Negative 05/15/2011 1627   HGBUR NEGATIVE 03/30/2024 0345   BILIRUBINUR NEGATIVE 03/30/2024 0345   BILIRUBINUR Negative 05/15/2011 1627   KETONESUR NEGATIVE 03/30/2024 0345   PROTEINUR NEGATIVE 03/30/2024 0345   UROBILINOGEN 1.0 06/09/2008 1748   NITRITE NEGATIVE 03/30/2024 0345   LEUKOCYTESUR NEGATIVE 03/30/2024 0345   LEUKOCYTESUR Negative 05/15/2011 1627   Sepsis Labs Recent Labs  Lab 03/31/24 0434 04/01/24 0621 04/02/24 0557 04/03/24 0454  WBC 15.5* 13.9* 11.8* 10.9*   Microbiology Recent Results (from the past 240 hours)  Culture, blood (Routine X 2) w Reflex to ID Panel     Status: None   Collection Time: 03/27/24 12:10 PM   Specimen: BLOOD  Result Value Ref Range Status   Specimen Description BLOOD BLOOD RIGHT ARM  Final   Special Requests   Final    BOTTLES DRAWN AEROBIC AND ANAEROBIC Blood Culture adequate volume   Culture   Final    NO GROWTH 5 DAYS Performed at [facility], [address], [place], [place] [address]    Report Status 04/01/2024 FINAL  Final  Culture, blood (Routine X 2) w Reflex to ID Panel     Status: Abnormal   Collection Time: 03/27/24 12:10 PM   Specimen: BLOOD LEFT ARM  Result Value Ref Range Status   Specimen Description   Final    BLOOD LEFT ARM Performed at [facility], [address] [place], [place] [address]    Special Requests   Final    BOTTLES DRAWN AEROBIC AND ANAEROBIC Blood Culture adequate volume Performed at [facility], [address] [place], [place] [address]    Culture  Setup Time   Final    GRAM POSITIVE COCCI IN BOTH AEROBIC AND ANAEROBIC BOTTLES CRITICAL RESULT CALLED TO, READ BACK BY AND VERIFIED WITH: TREY GREENWOOD 03/28/24 0928 MW    Culture (A)  Final    STAPHYLOCOCCUS EPIDERMIDIS THE SIGNIFICANCE OF ISOLATING THIS ORGANISM FROM A SINGLE SET OF BLOOD CULTURES WHEN MULTIPLE SETS ARE DRAWN IS UNCERTAIN. PLEASE NOTIFY THE MICROBIOLOGY DEPARTMENT WITHIN ONE WEEK IF SPECIATION AND SENSITIVITIES ARE REQUIRED. Performed at [facility], [address], [place], [place] [address]    Report Status 03/30/2024 FINAL  Final  Blood Culture ID Panel (Reflexed)     Status:  Abnormal   Collection Time: 03/27/24 12:10 PM  Result Value Ref Range Status   Enterococcus faecalis NOT DETECTED NOT DETECTED Final   Enterococcus Faecium NOT DETECTED NOT DETECTED Final   Listeria monocytogenes NOT DETECTED NOT DETECTED Final   Staphylococcus species DETECTED (A) NOT DETECTED Final    Comment: CRITICAL RESULT CALLED TO, READ BACK BY AND VERIFIED WITH: TREY GREENWOOD 03/28/24 0928 MW    Staphylococcus aureus (BCID) NOT DETECTED NOT DETECTED Final   Staphylococcus epidermidis DETECTED (A) NOT DETECTED Final    Comment: Methicillin (oxacillin) resistant coagulase negative staphylococcus. Possible blood culture contaminant (unless isolated from more than one blood culture draw or clinical case suggests pathogenicity). No antibiotic treatment is indicated for blood  culture contaminants. CRITICAL RESULT CALLED TO, READ BACK BY AND VERIFIED WITH: TREY GREENWOOD 03/28/24 0928 MW    Staphylococcus lugdunensis NOT DETECTED NOT DETECTED Final   Streptococcus species NOT DETECTED NOT DETECTED Final   Streptococcus agalactiae NOT DETECTED NOT DETECTED Final   Streptococcus pneumoniae NOT DETECTED NOT DETECTED Final   Streptococcus pyogenes NOT DETECTED NOT DETECTED Final   A.calcoaceticus-baumannii NOT  DETECTED NOT DETECTED Final   Bacteroides fragilis NOT DETECTED NOT DETECTED Final   Enterobacterales NOT DETECTED NOT DETECTED Final   Enterobacter cloacae complex NOT DETECTED NOT DETECTED Final   Escherichia coli NOT DETECTED NOT DETECTED Final   Klebsiella aerogenes NOT DETECTED NOT DETECTED Final   Klebsiella oxytoca NOT DETECTED NOT DETECTED Final   Klebsiella pneumoniae NOT DETECTED NOT DETECTED Final   Proteus species NOT DETECTED NOT DETECTED Final   Salmonella species NOT DETECTED NOT DETECTED Final   Serratia marcescens NOT DETECTED NOT DETECTED Final   Haemophilus influenzae NOT DETECTED NOT DETECTED Final   Neisseria meningitidis NOT DETECTED NOT DETECTED Final   Pseudomonas aeruginosa NOT DETECTED NOT DETECTED Final   Stenotrophomonas maltophilia NOT DETECTED NOT DETECTED Final   Candida albicans NOT DETECTED NOT DETECTED Final   Candida auris NOT DETECTED NOT DETECTED Final   Candida glabrata NOT DETECTED NOT DETECTED Final   Candida krusei NOT DETECTED NOT DETECTED Final   Candida parapsilosis NOT DETECTED NOT DETECTED Final   Candida tropicalis NOT DETECTED NOT DETECTED Final   Cryptococcus neoformans/gattii NOT DETECTED NOT DETECTED Final   Methicillin resistance mecA/C DETECTED (A) NOT DETECTED Final    Comment: CRITICAL RESULT CALLED TO, READ BACK BY AND VERIFIED WITH: MOSE BLEW 03/28/24 9071 MW Performed at [facility], [address] [place], [place] [address]   C Difficile Quick Screen (NO PCR Reflex)     Status: None   Collection Time: 03/28/24  9:15 AM   Specimen: STOOL  Result Value Ref Range Status   C Diff antigen NEGATIVE NEGATIVE Final   C Diff toxin NEGATIVE NEGATIVE Final   C Diff interpretation No C. difficile detected.  Final    Comment: Performed at [facility], [address] [place], [place] [address]  Culture, blood (Routine X 2) w Reflex to ID Panel     Status: None   Collection Time: 03/28/24 10:46 AM   Specimen:  BLOOD RIGHT HAND  Result Value Ref Range Status   Specimen Description BLOOD RIGHT HAND  Final   Special Requests   Final    BOTTLES DRAWN AEROBIC AND ANAEROBIC Blood Culture adequate volume   Culture   Final    NO GROWTH 5 DAYS Performed at [facility], [address] [place], [place] [address]    Report Status 04/02/2024 FINAL  Final  Culture, blood (Routine X 2) w Reflex to ID Panel     Status: None   Collection Time: 03/28/24 10:46 AM   Specimen: BLOOD RIGHT ARM  Result Value Ref Range Status   Specimen Description BLOOD RIGHT ARM  Final   Special Requests   Final    BOTTLES DRAWN AEROBIC AND ANAEROBIC Blood Culture adequate volume   Culture   Final    NO GROWTH 5 DAYS Performed at [facility], [address], [place], [place] [address]    Report Status 04/02/2024 FINAL  Final     Total time spend on discharging this patient, including the last patient exam, discussing the hospital stay, instructions for ongoing care as it relates to all pertinent caregivers, as well as preparing the medical discharge records, prescriptions, and/or referrals as applicable, is 45 minutes.    Ellouise Haber, MD  [facility] 04/04/2024, 1:37 PM

## 2024-04-04 NOTE — Progress Notes (Signed)
 Patient being Dc'd to [facility]. [facility] for transport. AM Nurse called report to [facility]. Granddaughter at bedside, no questions or concerns at this time.

## 2024-04-04 NOTE — Progress Notes (Addendum)
 Report called to [place] at [facility]. All questions answered.   Family updated on room number and transport information.

## 2024-04-04 NOTE — Progress Notes (Addendum)
 Patient repositioned with VS. Patient is dry, 400 in pure wick canister

## 2024-04-04 NOTE — Plan of Care (Signed)
  Problem: Education: Goal: Knowledge of General Education information will improve Description: Including pain rating scale, medication(s)/side effects and non-pharmacologic comfort measures Outcome: Adequate for Discharge   Problem: Health Behavior/Discharge Planning: Goal: Ability to manage health-related needs will improve Outcome: Adequate for Discharge   Problem: Clinical Measurements: Goal: Ability to maintain clinical measurements within normal limits will improve Outcome: Adequate for Discharge Goal: Will remain free from infection Outcome: Adequate for Discharge Goal: Diagnostic test results will improve Outcome: Adequate for Discharge Goal: Respiratory complications will improve Outcome: Adequate for Discharge Goal: Cardiovascular complication will be avoided Outcome: Adequate for Discharge   Problem: Activity: Goal: Risk for activity intolerance will decrease Outcome: Adequate for Discharge   Problem: Nutrition: Goal: Adequate nutrition will be maintained Outcome: Adequate for Discharge   Problem: Coping: Goal: Level of anxiety will decrease Outcome: Adequate for Discharge   Problem: Elimination: Goal: Will not experience complications related to bowel motility Outcome: Adequate for Discharge Goal: Will not experience complications related to urinary retention Outcome: Adequate for Discharge   Problem: Pain Managment: Goal: General experience of comfort will improve and/or be controlled Outcome: Adequate for Discharge   Problem: Safety: Goal: Ability to remain free from injury will improve Outcome: Adequate for Discharge   Problem: Skin Integrity: Goal: Risk for impaired skin integrity will decrease Outcome: Adequate for Discharge   Problem: Education: Goal: Knowledge of disease or condition will improve Outcome: Adequate for Discharge Goal: Knowledge of secondary prevention will improve (MUST DOCUMENT ALL) Outcome: Adequate for Discharge Goal:  Knowledge of patient specific risk factors will improve (DELETE if not current risk factor) Outcome: Adequate for Discharge   Problem: Ischemic Stroke/TIA Tissue Perfusion: Goal: Complications of ischemic stroke/TIA will be minimized Outcome: Adequate for Discharge   Problem: Coping: Goal: Will verbalize positive feelings about self Outcome: Adequate for Discharge Goal: Will identify appropriate support needs Outcome: Adequate for Discharge   Problem: Health Behavior/Discharge Planning: Goal: Ability to manage health-related needs will improve Outcome: Adequate for Discharge Goal: Goals will be collaboratively established with patient/family Outcome: Adequate for Discharge   Problem: Self-Care: Goal: Ability to participate in self-care as condition permits will improve Outcome: Adequate for Discharge Goal: Verbalization of feelings and concerns over difficulty with self-care will improve Outcome: Adequate for Discharge Goal: Ability to communicate needs accurately will improve Outcome: Adequate for Discharge   Problem: Nutrition: Goal: Risk of aspiration will decrease Outcome: Adequate for Discharge Goal: Dietary intake will improve Outcome: Adequate for Discharge   Problem: Education: Goal: Ability to describe self-care measures that may prevent or decrease complications (Diabetes Survival Skills Education) will improve Outcome: Adequate for Discharge Goal: Individualized Educational Video(s) Outcome: Adequate for Discharge   Problem: Coping: Goal: Ability to adjust to condition or change in health will improve Outcome: Adequate for Discharge   Problem: Fluid Volume: Goal: Ability to maintain a balanced intake and output will improve Outcome: Adequate for Discharge   Problem: Health Behavior/Discharge Planning: Goal: Ability to identify and utilize available resources and services will improve Outcome: Adequate for Discharge Goal: Ability to manage health-related  needs will improve Outcome: Adequate for Discharge   Problem: Metabolic: Goal: Ability to maintain appropriate glucose levels will improve Outcome: Adequate for Discharge   Problem: Nutritional: Goal: Maintenance of adequate nutrition will improve Outcome: Adequate for Discharge Goal: Progress toward achieving an optimal weight will improve Outcome: Adequate for Discharge   Problem: Skin Integrity: Goal: Risk for impaired skin integrity will decrease Outcome: Adequate for Discharge

## 2024-04-06 ENCOUNTER — Emergency Department (HOSPITAL_COMMUNITY)

## 2024-04-06 ENCOUNTER — Other Ambulatory Visit: Payer: Self-pay

## 2024-04-06 ENCOUNTER — Inpatient Hospital Stay (HOSPITAL_COMMUNITY)
Admission: EM | Admit: 2024-04-06 | Discharge: 2024-04-08 | Disposition: A | Discharge status: Skilled Nursing Facility | Source: Home / Self Care | Attending: Internal Medicine | Admitting: Internal Medicine

## 2024-04-06 ENCOUNTER — Encounter (HOSPITAL_COMMUNITY): Payer: Self-pay

## 2024-04-06 DIAGNOSIS — I69398 Other sequelae of cerebral infarction: Secondary | ICD-10-CM

## 2024-04-06 DIAGNOSIS — Z8709 Personal history of other diseases of the respiratory system: Secondary | ICD-10-CM

## 2024-04-06 DIAGNOSIS — Z931 Gastrostomy status: Secondary | ICD-10-CM

## 2024-04-06 DIAGNOSIS — R0682 Tachypnea, not elsewhere classified: Secondary | ICD-10-CM | POA: Insufficient documentation

## 2024-04-06 DIAGNOSIS — R569 Unspecified convulsions: Principal | ICD-10-CM

## 2024-04-06 DIAGNOSIS — I10 Essential (primary) hypertension: Secondary | ICD-10-CM | POA: Diagnosis present

## 2024-04-06 DIAGNOSIS — R509 Fever, unspecified: Secondary | ICD-10-CM | POA: Diagnosis present

## 2024-04-06 DIAGNOSIS — Z8673 Personal history of transient ischemic attack (TIA), and cerebral infarction without residual deficits: Secondary | ICD-10-CM

## 2024-04-06 DIAGNOSIS — F05 Delirium due to known physiological condition: Secondary | ICD-10-CM | POA: Diagnosis not present

## 2024-04-06 DIAGNOSIS — R000 Tachycardia, unspecified: Secondary | ICD-10-CM | POA: Insufficient documentation

## 2024-04-06 DIAGNOSIS — E875 Hyperkalemia: Secondary | ICD-10-CM

## 2024-04-06 DIAGNOSIS — L98429 Non-pressure chronic ulcer of back with unspecified severity: Secondary | ICD-10-CM | POA: Insufficient documentation

## 2024-04-06 LAB — CBC WITH DIFFERENTIAL/PLATELET
Abs Immature Granulocytes: 0.06 K/uL (ref 0.00–0.07)
Basophils Absolute: 0.1 K/uL (ref 0.0–0.1)
Basophils Relative: 1 %
Eosinophils Absolute: 0.3 K/uL (ref 0.0–0.5)
Eosinophils Relative: 2 %
HCT: 38.3 % (ref 36.0–46.0)
Hemoglobin: 12 g/dL (ref 12.0–15.0)
Immature Granulocytes: 1 %
Lymphocytes Relative: 15 %
Lymphs Abs: 1.9 K/uL (ref 0.7–4.0)
MCH: 28.5 pg (ref 26.0–34.0)
MCHC: 31.3 g/dL (ref 30.0–36.0)
MCV: 91 fL (ref 80.0–100.0)
Monocytes Absolute: 0.9 K/uL (ref 0.1–1.0)
Monocytes Relative: 7 %
Neutro Abs: 9.4 K/uL — ABNORMAL HIGH (ref 1.7–7.7)
Neutrophils Relative %: 74 %
Platelets: 355 K/uL (ref 150–400)
RBC: 4.21 MIL/uL (ref 3.87–5.11)
RDW: 13.3 % (ref 11.5–15.5)
WBC: 12.5 K/uL — ABNORMAL HIGH (ref 4.0–10.5)
nRBC: 0 % (ref 0.0–0.2)

## 2024-04-06 LAB — COMPREHENSIVE METABOLIC PANEL WITH GFR
ALT: 34 U/L (ref 0–44)
AST: 34 U/L (ref 15–41)
Albumin: 2.5 g/dL — ABNORMAL LOW (ref 3.5–5.0)
Alkaline Phosphatase: 82 U/L (ref 38–126)
Anion gap: 7 (ref 5–15)
BUN: 20 mg/dL (ref 8–23)
CO2: 40 mmol/L — ABNORMAL HIGH (ref 22–32)
Calcium: 8.7 mg/dL — ABNORMAL LOW (ref 8.9–10.3)
Chloride: 91 mmol/L — ABNORMAL LOW (ref 98–111)
Creatinine, Ser: 0.51 mg/dL (ref 0.44–1.00)
GFR, Estimated: >60 mL/min (ref >60)
Glucose, Bld: 154 mg/dL — ABNORMAL HIGH (ref 70–99)
Potassium: 5.2 mmol/L — ABNORMAL HIGH (ref 3.5–5.1)
Sodium: 138 mmol/L (ref 135–145)
Total Bilirubin: 0.7 mg/dL (ref 0.0–1.2)
Total Protein: 6.6 g/dL (ref 6.5–8.1)

## 2024-04-06 LAB — URINALYSIS, ROUTINE W REFLEX MICROSCOPIC
Bilirubin Urine: NEGATIVE
Glucose, UA: NEGATIVE mg/dL
Hgb urine dipstick: NEGATIVE
Ketones, ur: NEGATIVE mg/dL
Leukocytes,Ua: NEGATIVE
Nitrite: NEGATIVE
Protein, ur: NEGATIVE mg/dL
Specific Gravity, Urine: 1.01 (ref 1.005–1.030)
pH: 8 (ref 5.0–8.0)

## 2024-04-06 LAB — CBG MONITORING, ED: Glucose-Capillary: 150 mg/dL — ABNORMAL HIGH (ref 70–99)

## 2024-04-06 LAB — MAGNESIUM: Magnesium: 2.3 mg/dL (ref 1.7–2.4)

## 2024-04-06 LAB — PHOSPHORUS: Phosphorus: 3.9 mg/dL (ref 2.5–4.6)

## 2024-04-06 MED ORDER — ADULT MULTIVITAMIN W/MINERALS CH
1.0000 | ORAL_TABLET | Freq: Every day | ORAL | Status: DC
  Administered 2024-04-07 – 2024-04-08 (×2): 1
  Filled 2024-04-06 (×2): qty 1

## 2024-04-06 MED ORDER — FREE WATER
300.0000 mL | Status: DC
  Administered 2024-04-07 – 2024-04-08 (×9): 300 mL

## 2024-04-06 MED ORDER — SODIUM CHLORIDE 0.9 % IV SOLN: 250.0000 mL | INTRAVENOUS | Status: AC | PRN

## 2024-04-06 MED ORDER — LORAZEPAM 2 MG/ML IJ SOLN: 2.0000 mg | Freq: Four times a day (QID) | INTRAMUSCULAR | Status: DC | PRN

## 2024-04-06 MED ORDER — LEVALBUTEROL HCL 0.63 MG/3ML IN NEBU: 0.6300 mg | INHALATION_SOLUTION | Freq: Four times a day (QID) | RESPIRATORY_TRACT | Status: DC | PRN

## 2024-04-06 MED ORDER — SODIUM CHLORIDE 0.9% FLUSH: 3.0000 mL | INTRAVENOUS | Status: DC | PRN

## 2024-04-06 MED ORDER — ENOXAPARIN SODIUM 40 MG/0.4ML IJ SOSY
40.0000 mg | PREFILLED_SYRINGE | INTRAMUSCULAR | Status: DC
  Administered 2024-04-07 – 2024-04-08 (×2): 40 mg via SUBCUTANEOUS
  Filled 2024-04-06 (×2): qty 0.4

## 2024-04-06 MED ORDER — LEVETIRACETAM (KEPPRA) 500 MG/5 ML ADULT IV PUSH: 1500.0000 mg | Freq: Once | INTRAVENOUS | Status: DC

## 2024-04-06 MED ORDER — ASPIRIN 81 MG PO CHEW
81.0000 mg | CHEWABLE_TABLET | Freq: Every day | ORAL | Status: DC
  Administered 2024-04-07 – 2024-04-08 (×2): 81 mg
  Filled 2024-04-06 (×2): qty 1

## 2024-04-06 MED ORDER — VITAMIN D (ERGOCALCIFEROL) 1.25 MG (50000 UNIT) PO CAPS
50000.0000 [IU] | ORAL_CAPSULE | ORAL | Status: DC
  Filled 2024-04-06: qty 1

## 2024-04-06 MED ORDER — ACETAMINOPHEN 325 MG PO TABS
650.0000 mg | ORAL_TABLET | Freq: Four times a day (QID) | ORAL | Status: DC | PRN
  Administered 2024-04-07 (×2): 650 mg
  Filled 2024-04-06 (×2): qty 2

## 2024-04-06 MED ORDER — ACETAMINOPHEN 650 MG RE SUPP: 650.0000 mg | Freq: Four times a day (QID) | RECTAL | Status: DC | PRN

## 2024-04-06 MED ORDER — JUVEN PO PACK
1.0000 | PACK | Freq: Two times a day (BID) | ORAL | Status: DC
  Administered 2024-04-07 – 2024-04-08 (×3): 1
  Filled 2024-04-06 (×5): qty 1

## 2024-04-06 MED ORDER — DEXTROSE 50 % IV SOLN: 1.0000 | INTRAVENOUS | Status: DC | PRN

## 2024-04-06 MED ORDER — LEVETIRACETAM (KEPPRA) 500 MG/5 ML ADULT IV PUSH
3000.0000 mg | Freq: Once | INTRAVENOUS | Status: AC
  Administered 2024-04-06: 3000 mg via INTRAVENOUS
  Filled 2024-04-06: qty 30

## 2024-04-06 MED ORDER — VITAMIN C 500 MG PO TABS
500.0000 mg | ORAL_TABLET | Freq: Two times a day (BID) | ORAL | Status: DC
  Administered 2024-04-07 – 2024-04-08 (×3): 500 mg
  Filled 2024-04-06 (×3): qty 1

## 2024-04-06 MED ORDER — ONDANSETRON HCL 4 MG/2ML IJ SOLN: 4.0000 mg | Freq: Four times a day (QID) | INTRAMUSCULAR | Status: DC | PRN

## 2024-04-06 MED ORDER — PROPRANOLOL HCL 10 MG PO TABS
20.0000 mg | ORAL_TABLET | Freq: Three times a day (TID) | ORAL | Status: DC
  Administered 2024-04-06 – 2024-04-08 (×5): 20 mg
  Filled 2024-04-06 (×5): qty 2

## 2024-04-06 MED ORDER — ZINC SULFATE 220 (50 ZN) MG PO CAPS
220.0000 mg | ORAL_CAPSULE | Freq: Every day | ORAL | Status: DC
  Administered 2024-04-07 – 2024-04-08 (×2): 220 mg
  Filled 2024-04-06 (×2): qty 1

## 2024-04-06 MED ORDER — SODIUM ZIRCONIUM CYCLOSILICATE 10 G PO PACK
10.0000 g | PACK | Freq: Once | ORAL | Status: AC
  Administered 2024-04-06: 10 g
  Filled 2024-04-06: qty 1

## 2024-04-06 MED ORDER — OSMOLITE 1.2 CAL PO LIQD
1000.0000 mL | ORAL | Status: DC
  Administered 2024-04-07: 1000 mL
  Filled 2024-04-06 (×3): qty 1000

## 2024-04-06 MED ORDER — PROSOURCE TF20 ENFIT COMPATIBL EN LIQD
60.0000 mL | Freq: Every day | ENTERAL | Status: DC
  Administered 2024-04-07: 60 mL
  Filled 2024-04-06 (×3): qty 60

## 2024-04-06 MED ORDER — MEDIHONEY WOUND/BURN DRESSING EX PSTE
1.0000 | PASTE | Freq: Every day | CUTANEOUS | Status: DC
  Administered 2024-04-07 – 2024-04-08 (×2): 1 via TOPICAL
  Filled 2024-04-06: qty 44

## 2024-04-06 MED ORDER — SODIUM CHLORIDE 0.9% FLUSH
3.0000 mL | Freq: Two times a day (BID) | INTRAVENOUS | Status: DC
  Administered 2024-04-07 – 2024-04-08 (×3): 3 mL via INTRAVENOUS

## 2024-04-06 MED ORDER — ONDANSETRON HCL 4 MG PO TABS: 4.0000 mg | ORAL_TABLET | Freq: Four times a day (QID) | ORAL | Status: DC | PRN

## 2024-04-06 MED ORDER — SODIUM CHLORIDE 0.9% FLUSH
3.0000 mL | Freq: Two times a day (BID) | INTRAVENOUS | Status: DC
  Administered 2024-04-07: 3 mL via INTRAVENOUS

## 2024-04-06 MED ORDER — CLOPIDOGREL BISULFATE 75 MG PO TABS
75.0000 mg | ORAL_TABLET | Freq: Every day | ORAL | Status: DC
  Administered 2024-04-07 – 2024-04-08 (×2): 75 mg
  Filled 2024-04-06 (×2): qty 1

## 2024-04-06 MED ORDER — ATORVASTATIN CALCIUM 80 MG PO TABS
80.0000 mg | ORAL_TABLET | Freq: Every day | ORAL | Status: DC
  Administered 2024-04-07 – 2024-04-08 (×2): 80 mg
  Filled 2024-04-06 (×2): qty 1

## 2024-04-06 MED FILL — Levetiracetam Inj 500 MG/5ML (100 MG/ML): 500.0000 mg | INTRAVENOUS | Qty: 5 | Status: AC

## 2024-04-06 NOTE — ED Provider Notes (Signed)
 [place] EMERGENCY DEPARTMENT AT [facility] Provider Note   CSN: [id number] Arrival date & time: 04/06/24  1521     Patient presents with: Seizures   Marie Pham is a 78 y.o. female.   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  The patient is here with her daughter who helps provide history.  Her daughter works at her nursing home facility and states that about 45 minutes prior to arrival she was at her normal baseline where she is awake and alert, paralyzed on the right with significant weakness on the left.  She states that the staff was moving her using a Hoyer lift from a smaller bed to a bigger bed and after they transferred her she started to develop shaking in the right upper extremity with gaze deviation.  Her daughter thinks that the episode may have lasted 15 to 20 minutes and that her mom has been minimally responsive since.  She did receive 2.5 mg of Versed by EMS with resolution of the shaking.  The history is provided by a relative and the EMS personnel. The history is limited by the condition of the patient.  Seizures      Prior to Admission medications  Medication Sig Start Date End Date Taking? Authorizing Provider  albuterol  (PROVENTIL  HFA;VENTOLIN  HFA) 108 (90 BASE) MCG/ACT inhaler Inhale into the lungs.    [provider]  ascorbic acid  (VITAMIN C ) 500 MG tablet Place 1 tablet (500 mg total) into feeding tube 2 (two) times daily. 04/04/24   Awanda City, MD  aspirin  81 MG chewable tablet Place 1 tablet (81 mg total) into feeding tube daily. 04/05/24   Awanda City, MD  atorvastatin  (LIPITOR ) 80 MG tablet Place 1 tablet (80 mg total) into feeding tube daily. 04/05/24   Awanda City, MD  brimonidine-timolol (COMBIGAN)  0.2-0.5 % ophthalmic solution Apply 1 drop to eye every 12 (twelve) hours. 12/16/17   [provider]  Cholecalciferol (VITAMIN D3) 1.25 MG (50000 UT) CAPS Take 1 capsule by mouth once a week. 01/04/24   [provider]  clopidogrel  (PLAVIX ) 75 MG tablet Place 1 tablet (75 mg total) into feeding tube daily. 04/05/24   Awanda City, MD  collagenase  (SANTYL ) 250 UNIT/GM ointment Apply topically daily. 04/05/24   Awanda City, MD  famotidine  (PEPCID ) 40 MG tablet Take 1 tablet (40 mg total) by mouth every evening. 06/08/21 03/20/24  Arlander Charleston, MD  latanoprost  (XALATAN ) 0.005 % ophthalmic solution Place 1 drop into both eyes at bedtime. 12/23/23   [provider]  Menthol-Methyl Salicylate (MUSCLE RUB) 10-15 % CREA Apply 1 Application topically as needed. 11/09/22   Cyrena Mylar, MD  Multiple Vitamin (MULTIVITAMIN WITH MINERALS) TABS tablet Place 1 tablet into feeding tube daily. 04/05/24   Awanda City, MD  nutrition supplement, JUVEN, RAWLEIGH) PACK Place 1 packet into feeding tube 2 (two) times daily between meals. 04/04/24   Awanda City, MD  Nutritional Supplements (FEEDING SUPPLEMENT, OSMOLITE 1.2 CAL,) LIQD Place 1,000 mLs into feeding tube continuous. 04/04/24   Awanda City, MD  propranolol  (INDERAL ) 20 MG tablet Place 1 tablet (20 mg total) into feeding tube 3 (three) times daily. 04/04/24   Awanda City, MD  Protein (FEEDING SUPPLEMENT, PROSOURCE TF20,) liquid Place 60 mLs into feeding tube daily. 04/05/24   Awanda City,  MD  Water  For Irrigation, Sterile (FREE WATER ) SOLN Place 300 mLs into feeding tube every 4 (four) hours. 04/04/24   Awanda City, MD  zinc  sulfate, 50mg  elemental zinc , 220 (50 Zn) MG capsule Place 1 capsule (220 mg total) into feeding tube daily. 04/05/24   Awanda City, MD    Allergies: Lansoprazole, Penicillins, Prozac [fluoxetine], and Bupropion    Review of Systems  Neurological:  Positive for seizures.    Updated Vital Signs BP (!) 163/80   Pulse 95   Temp (!) 97.1 F  (36.2 C) (Oral)   Resp (!) 37   Ht 5' 5 (1.651 m)   Wt 85.3 kg   SpO2 100%   BMI 31.28 kg/m   Physical Exam Vitals and nursing note reviewed.  Constitutional:      General: She is not in acute distress.    Appearance: She is ill-appearing (chronically).     Comments: Drowsy, post-ictal appearing  HENT:     Head: Normocephalic and atraumatic.     Nose: Nose normal.     Mouth/Throat:     Mouth: Mucous membranes are moist.     Pharynx: Oropharynx is clear.  Eyes:     Conjunctiva/sclera: Conjunctivae normal.     Pupils: Pupils are equal, round, and reactive to light.  Cardiovascular:     Rate and Rhythm: Normal rate and regular rhythm.     Heart sounds: Normal heart sounds.  Pulmonary:     Effort: Pulmonary effort is normal.     Breath sounds: Normal breath sounds.  Abdominal:     General: Abdomen is flat.     Palpations: Abdomen is soft.     Tenderness: There is no abdominal tenderness.  Musculoskeletal:        General: No deformity.     Cervical back: Normal range of motion.     Right lower leg: No edema.     Left lower leg: No edema.  Skin:    General: Skin is warm and dry.  Neurological:     Comments: Non-arousable to noxious stimuli, not following any commands     (all labs ordered are listed, but only abnormal results are displayed) Labs Reviewed  COMPREHENSIVE METABOLIC PANEL WITH GFR - Abnormal; Notable for the following components:      Result Value   Potassium 5.2 (*)    Chloride 91 (*)    CO2 40 (*)    Glucose, Bld 154 (*)    Calcium  8.7 (*)    Albumin 2.5 (*)    All other components within normal limits  CBC WITH DIFFERENTIAL/PLATELET - Abnormal; Notable for the following components:   WBC 12.5 (*)    Neutro Abs 9.4 (*)    All other components within normal limits  CBG MONITORING, ED - Abnormal; Notable for the following components:   Glucose-Capillary 150 (*)    All other components within normal limits  MAGNESIUM  PHOSPHORUS  URINALYSIS,  ROUTINE W REFLEX MICROSCOPIC    EKG: EKG Interpretation Date/Time:  Thursday April 06 2024 17:27:22 EST Ventricular Rate:  94 PR Interval:  160 QRS Duration:  133 QT Interval:  362 QTC Calculation: 453 R Axis:   92  Text Interpretation: Sinus rhythm Prominent P waves, nondiagnostic RBBB and LPFB Consider anterolateral infarct No significant change since last tracing Confirmed by Ellouise Fine ([id number]) on 04/06/2024 5:48:18 PM  Radiology: CT HEAD WO CONTRAST Result Date: 04/06/2024 CLINICAL DATA:  Seizure EXAM: CT HEAD WITHOUT CONTRAST TECHNIQUE: Contiguous axial images  were obtained from the base of the skull through the vertex without intravenous contrast. RADIATION DOSE REDUCTION: This exam was performed according to the departmental dose-optimization program which includes automated exposure control, adjustment of the mA and/or kV according to patient size and/or use of iterative reconstruction technique. COMPARISON:  03/30/2024 FINDINGS: Brain: Grossly stable appearance of the bilateral frontal lobe infarcts noted on prior study. No evidence of hemorrhagic transformation. No new areas of infarct. Lateral ventricles and midline structures are otherwise unremarkable. No acute extra-axial fluid collections. No mass effect. Vascular: No hyperdense vessel or unexpected calcification. Skull: Normal. Negative for fracture or focal lesion. Sinuses/Orbits: No acute finding. Other: None. IMPRESSION: 1. Stable appearance of the bilateral anterior cerebral artery territory infarcts. No evidence of hemorrhagic transformation. 2. No acute intracranial process. Electronically Signed   By: Ozell Daring M.D.   On: 04/06/2024 17:20   DG Chest Port 1 View Result Date: 04/06/2024 CLINICAL DATA:  Altered mental status. EXAM: PORTABLE CHEST 1 VIEW COMPARISON:  Chest radiograph dated 03/29/2024. FINDINGS: No focal consolidation, pleural effusion or pneumothorax. Stable cardiac silhouette. No acute osseous  pathology. IMPRESSION: No active disease. Electronically Signed   By: Vanetta Chou M.D.   On: 04/06/2024 17:15     Procedures   Medications Ordered in the ED  sodium zirconium cyclosilicate  (LOKELMA ) packet 10 g (has no administration in time range)  levETIRAcetam (KEPPRA) undiluted injection 3,000 mg (has no administration in time range)    Clinical Course as of 04/06/24 1907  Thu Apr 06, 2024  1748 No acute abnormality on [facility]. Labs pending.  [VK]  1809 I spoke with Dr. Voncile with neurology - if patient is back to baseline and labs within normal range can start on keppra and be discharged back to facility. If prolonged post-ictal period and does not return to baseline may need admission for Obs and EEG. [VK]  1848 Mild hyperkalemia, unchanged from last labs. Will be given lokelma . No hyperkalemic changes on EKG. UA negative, no signs of infection.  [VK]  1902 Patient now more awake, but still not tracking with her eyes which her baseline. Will plan for admission for prolonged post ictal. Keppra and lokelma  ordered.  [VK]    Clinical Course User Index [VK] Kingsley, Joby Richart K, DO                                 Medical Decision Making This patient presents to the ED with chief complaint(s) of shaking, AMS with pertinent past medical history of recent CVA, HTN, G-tube dependent which further complicates the presenting complaint. The complaint involves an extensive differential diagnosis and also carries with it a high risk of complications and morbidity.    The differential diagnosis includes ICH, mass effect, CVA, seizure, infection, electrolyte abnormality, encephalopathy, medication side effect  Additional history obtained: Additional history obtained from family Records reviewed previous admission documents  ED Course and Reassessment: On patient's arrival she is hemodynamically stable though unresponsive and postictal appearing though did receive Versed and route which may  be contributing to her drowsy state.  The patient will have labs and CT imaging to evaluate for etiology of her shaking and mental status change, high suspicion for seizure with her recent stroke.  She was placed on seizure precautions and will likely need neurology consult.  Independent labs interpretation:  The following labs were independently interpreted: slight leukocytosis and mild hyperkalemia unchanged, otherwise within normal range  Independent visualization of imaging: - I independently visualized the following imaging with scope of interpretation limited to determining acute life threatening conditions related to emergency care: CTH, which revealed no acute abnormality  Consultation: - Consulted or discussed management/test interpretation w/ external professional: neurology  Consideration for admission or further workup: patient requires admission for new onset seizure and prolonged post ictal period Social Determinants of health: N/A    Amount and/or Complexity of Data Reviewed Labs: ordered. Radiology: ordered.  Risk Prescription drug management. Decision regarding hospitalization.       Final diagnoses:  Seizure [facility])    ED Discharge Orders     None          Kingsley, Tyler Robidoux K, DO 04/06/24 1907

## 2024-04-06 NOTE — H&P (Addendum)
 History and Physical    Marie Pham FMW:[id number] DOB: 17-Oct-1945 DOA: 04/06/2024  PCP: Tobie Domino, MD   Patient coming from: Home   Chief Complaint:  Chief Complaint  Patient presents with   Seizures   ED TRIAGE note:Pt brought in by [facility] from [facility]. EMS states facility said they were transferring pt to a new bed when she started having twitching movement to left arm and should. Head was turned to the right side.Twitching movements lasted for approx 7-8 minutes. Eyes deviated to the right. Pt was recently admitted to facility 2 days ago from recent stroke. Reports no movement baseline except with her eyes. EMS arrived and movements had stopped before they arrive. In route to ED EMS states movement started again with mild twitching of the right  shoulder and arm. Repots vitals remained stable during the is event.  2.5mg  of Versed was given in route eports twitching movement stopped within 1-2 minutes. 18G to left AC.   EMS vitals BP 138/92 P 92 R 20 O2 100 2 liter  HPI:  Marie Pham is a 78 y.o. 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  Her daughter works at her nursing home facility and states that about 45 minutes prior to arrival she was at her normal baseline where she is awake and alert, paralyzed on the right with significant weakness on  the left.  She states that the staff was moving her using a Hoyer lift from a smaller bed to a bigger bed and after they transferred her she started to develop shaking in the right upper extremity with gaze deviation.  Her daughter thinks that the episode may have lasted 10 to 15 minutes and that her mom has been minimally responsive since.  She did receive 2.5 mg of Versed by EMS with resolution of the shaking.  In the ED neurology being consulted recommended head CT to rule out acute process,.  Patient's labs is unremarkable.  Hemodynamically stable.  Per neurology recommendation if patient comes back to baseline loaded with Keppra 3 g followed by initiate Keppra 500 mg twice daily, seizure precaution and if patient does not comes back to her baseline neurology will do formal consultation.  As per patient's family she is still confused and has not been her baseline (per patient's daughter she is alert oriented at baseline even though she is paralyzed on the right side with significant weakness on the left).  Inpatient neurology has been consulted for formal evaluation and pending recommendation.  During my evaluation at bedside patient is drowsy and sleepy as she received Versed earlier.  Per patient's daughter she is able to communicate with her eyes however she will not understand all voice, and only few verbal cues she understand.   ED Course: At presentation to ED patient is hemodynamically stable except blood pressure is borderline elevated and history of chronic tachypnea respiratory between 37-39.  2 L oxygen O2 sat 98 to 100%. CBC showing chronic leukocytosis 12.5 otherwise unremarkable.  CMP showing slightly elevated potassium 5.2, low  chloride 91, elevated bicarb 40 and low albumin 2.5.  Normal mag and Phos level.  UA unremarkable.  Chest x-ray no active disease process. CT head no acute intracranial abnormality.Stable appearance of the bilateral anterior cerebral artery territory  infarcts. No evidence of hemorrhagic transformation.  In the ED patient has been loaded with Keppra and received Lokelma  10 g. Per patient's daughter at baseline patient is nonverbal however she used eye for communication/contacts and right now patient is gradually improving. EDP discussed with on-call neurology given patient still has not came back to baseline and postictal state recommended inpatient admission.  Hospitalist consulted for further evaluation management of postictal state, acute metabolic encephalopathy in the setting of seizure, new onset of seizure and hyperkalemia.  Significant labs in the ED: Lab Orders         Comprehensive metabolic panel         CBC with Differential/Platelet         Magnesium         Phosphorus         Urinalysis, Routine w reflex microscopic -Urine, Clean Catch         Comprehensive metabolic panel         CBC         CBG monitoring, ED       Review of Systems:  Review of Systems  Unable to perform ROS: Mental status change    Past Medical History:  Diagnosis Date   Asthma    Depression    Hypertension    Sleep apnea     Past Surgical History:  Procedure Laterality Date   ABDOMINAL HYSTERECTOMY     COLON SURGERY     EYE SURGERY Left    HERNIA REPAIR     IR GASTROSTOMY TUBE MOD SED  03/29/2024     reports that she quit smoking about 29 years ago. Her smoking use included cigarettes. She has never used smokeless tobacco. She reports that she does not drink alcohol and does not use drugs.  Allergies[1]  Family History  Problem Relation Age of Onset   Hypertension Sister    Alcohol abuse Brother    Hypertension Brother    Diabetes Brother     Prior to Admission medications  Medication Sig Start Date End Date Taking? Authorizing Provider  albuterol  (PROVENTIL  HFA;VENTOLIN  HFA) 108 (90 BASE) MCG/ACT inhaler Inhale into the lungs.    [provider]  ascorbic acid  (VITAMIN C ) 500 MG tablet Place 1 tablet (500 mg  total) into feeding tube 2 (two) times daily. 04/04/24   Awanda City, MD  aspirin  81 MG chewable tablet Place 1 tablet (81 mg total) into feeding tube daily. 04/05/24   Awanda City, MD  atorvastatin  (LIPITOR ) 80 MG tablet Place 1 tablet (80 mg total) into feeding tube daily. 04/05/24   Awanda City, MD  brimonidine-timolol (COMBIGAN) 0.2-0.5 % ophthalmic solution Apply 1 drop to eye every 12 (twelve) hours. 12/16/17   [provider]  Cholecalciferol (VITAMIN D3) 1.25 MG (50000 UT) CAPS Take 1 capsule by mouth once a week. 01/04/24   [provider]  clopidogrel  (PLAVIX ) 75 MG tablet Place 1 tablet (75 mg total) into feeding tube daily. 04/05/24   Awanda City, MD  collagenase  (SANTYL ) 250 UNIT/GM ointment Apply topically daily. 04/05/24   Awanda City, MD  famotidine  (PEPCID ) 40 MG tablet Take 1 tablet (40 mg total) by mouth every evening. 06/08/21 03/20/24  Arlander Charleston, MD  latanoprost  (XALATAN ) 0.005 % ophthalmic solution  Place 1 drop into both eyes at bedtime. 12/23/23   [provider]  Menthol-Methyl Salicylate (MUSCLE RUB) 10-15 % CREA Apply 1 Application topically as needed. 11/09/22   Cyrena Mylar, MD  Multiple Vitamin (MULTIVITAMIN WITH MINERALS) TABS tablet Place 1 tablet into feeding tube daily. 04/05/24   Awanda City, MD  nutrition supplement, JUVEN, RAWLEIGH) PACK Place 1 packet into feeding tube 2 (two) times daily between meals. 04/04/24   Awanda City, MD  Nutritional Supplements (FEEDING SUPPLEMENT, OSMOLITE 1.2 CAL,) LIQD Place 1,000 mLs into feeding tube continuous. 04/04/24   Awanda City, MD  propranolol  (INDERAL ) 20 MG tablet Place 1 tablet (20 mg total) into feeding tube 3 (three) times daily. 04/04/24   Awanda City, MD  Protein (FEEDING SUPPLEMENT, PROSOURCE TF20,) liquid Place 60 mLs into feeding tube daily. 04/05/24   Awanda City, MD  Water  For Irrigation, Sterile (FREE WATER ) SOLN Place 300 mLs into feeding tube every 4 (four) hours. 04/04/24   Awanda City, MD  zinc  sulfate, 50mg   elemental zinc , 220 (50 Zn) MG capsule Place 1 capsule (220 mg total) into feeding tube daily. 04/05/24   Awanda City, MD     Physical Exam: Vitals:   04/06/24 1930 04/06/24 1941 04/06/24 1945 04/06/24 2000  BP: (!) 154/82  (!) 153/77 (!) 162/70  Pulse: 97  97 95  Resp: (!) 29  (!) 32 (!) 32  Temp:  99.7 F (37.6 C)    TempSrc:  Axillary    SpO2: 100%  100% 100%  Weight:      Height:        Physical Exam Constitutional:      General: She is not in acute distress.    Appearance: She is obese. She is not ill-appearing, toxic-appearing or diaphoretic.     Comments: Nonverbal at baseline.  Sleepy due to side effect of Versed  HENT:     Head: Normocephalic and atraumatic.     Mouth/Throat:     Mouth: Mucous membranes are moist.  Eyes:     Pupils: Pupils are equal, round, and reactive to light.  Cardiovascular:     Rate and Rhythm: Regular rhythm. Tachycardia present.     Pulses: Normal pulses.  Pulmonary:     Effort: Pulmonary effort is normal.     Breath sounds: No wheezing, rhonchi or rales.  Abdominal:     Palpations: Abdomen is soft.  Musculoskeletal:        General: No swelling.     Cervical back: Neck supple.     Right lower leg: No edema.     Left lower leg: No edema.  Skin:    General: Skin is warm.     Capillary Refill: Capillary refill takes less than 2 seconds.  Neurological:     Comments: Nonverbal at baseline.  More drowsy/sleepy  Psychiatric:     Comments: Unable to assess      Labs on Admission: I have personally reviewed following labs and imaging studies  CBC: Recent Labs  Lab 03/31/24 0434 04/01/24 0621 04/02/24 0557 04/03/24 0454 04/06/24 1706  WBC 15.5* 13.9* 11.8* 10.9* 12.5*  NEUTROABS  --   --   --   --  9.4*  HGB 11.8* 11.1* 11.0* 10.6* 12.0  HCT 38.4 36.2 35.5* 35.2* 38.3  MCV 93.2 94.3 93.2 93.4 91.0  PLT 314 305 323 337 355   Basic Metabolic Panel: Recent Labs  Lab 04/01/24 1225 04/02/24 0557 04/03/24 0454 04/04/24 0358  04/06/24 1706  NA 148*  147* 143 140 138  K 4.9 5.1 5.3* 5.2* 5.2*  CL 108 106 101 96* 91*  CO2 35* 37* 38* 39* 40*  GLUCOSE 155* 180* 142* 126* 154*  BUN 40* 34* 32* 34* 20  CREATININE 0.51 0.52 0.47 0.39* 0.51  CALCIUM  9.2 9.3 10.4* 9.2 8.7*  MG  --  2.8*  --  2.6* 2.3  PHOS  --  3.1  --   --  3.9   GFR: Estimated Creatinine Clearance: 65.5 mL/min (by C-G formula based on SCr of 0.51 mg/dL). Liver Function Tests: Recent Labs  Lab 04/06/24 1706  AST 34  ALT 34  ALKPHOS 82  BILITOT 0.7  PROT 6.6  ALBUMIN 2.5*   No results for input(s): LIPASE, AMYLASE in the last 168 hours. No results for input(s): AMMONIA in the last 168 hours. Coagulation Profile: No results for input(s): INR, PROTIME in the last 168 hours. Cardiac Enzymes: No results for input(s): CKTOTAL, CKMB, CKMBINDEX, TROPONINI, TROPONINIHS in the last 168 hours. BNP (last 3 results) No results for input(s): BNP in the last 8760 hours. HbA1C: No results for input(s): HGBA1C in the last 72 hours. CBG: Recent Labs  Lab 04/04/24 0420 04/04/24 0735 04/04/24 1139 04/04/24 1601 04/06/24 1712  GLUCAP 147* 156* 100* 179* 150*   Lipid Profile: No results for input(s): CHOL, HDL, LDLCALC, TRIG, CHOLHDL, LDLDIRECT in the last 72 hours. Thyroid  Function Tests: No results for input(s): TSH, T4TOTAL, FREET4, T3FREE, THYROIDAB in the last 72 hours. Anemia Panel: No results for input(s): VITAMINB12, FOLATE, FERRITIN, TIBC, IRON, RETICCTPCT in the last 72 hours. Urine analysis:    Component Value Date/Time   COLORURINE YELLOW 04/06/2024 1725   APPEARANCEUR CLEAR 04/06/2024 1725   APPEARANCEUR Hazy 05/15/2011 1627   LABSPEC 1.010 04/06/2024 1725   LABSPEC 1.011 05/15/2011 1627   PHURINE 8.0 04/06/2024 1725   GLUCOSEU NEGATIVE 04/06/2024 1725   GLUCOSEU Negative 05/15/2011 1627   HGBUR NEGATIVE 04/06/2024 1725   BILIRUBINUR NEGATIVE 04/06/2024 1725    BILIRUBINUR Negative 05/15/2011 1627   KETONESUR NEGATIVE 04/06/2024 1725   PROTEINUR NEGATIVE 04/06/2024 1725   UROBILINOGEN 1.0 06/09/2008 1748   NITRITE NEGATIVE 04/06/2024 1725   LEUKOCYTESUR NEGATIVE 04/06/2024 1725   LEUKOCYTESUR Negative 05/15/2011 1627    Radiological Exams on Admission: I have personally reviewed images CT HEAD WO CONTRAST Result Date: 04/06/2024 CLINICAL DATA:  Seizure EXAM: CT HEAD WITHOUT CONTRAST TECHNIQUE: Contiguous axial images were obtained from the base of the skull through the vertex without intravenous contrast. RADIATION DOSE REDUCTION: This exam was performed according to the departmental dose-optimization program which includes automated exposure control, adjustment of the mA and/or kV according to patient size and/or use of iterative reconstruction technique. COMPARISON:  03/30/2024 FINDINGS: Brain: Grossly stable appearance of the bilateral frontal lobe infarcts noted on prior study. No evidence of hemorrhagic transformation. No new areas of infarct. Lateral ventricles and midline structures are otherwise unremarkable. No acute extra-axial fluid collections. No mass effect. Vascular: No hyperdense vessel or unexpected calcification. Skull: Normal. Negative for fracture or focal lesion. Sinuses/Orbits: No acute finding. Other: None. IMPRESSION: 1. Stable appearance of the bilateral anterior cerebral artery territory infarcts. No evidence of hemorrhagic transformation. 2. No acute intracranial process. Electronically Signed   By: Ozell Daring M.D.   On: 04/06/2024 17:20   DG Chest Port 1 View Result Date: 04/06/2024 CLINICAL DATA:  Altered mental status. EXAM: PORTABLE CHEST 1 VIEW COMPARISON:  Chest radiograph dated 03/29/2024. FINDINGS: No focal consolidation, pleural effusion or pneumothorax.  Stable cardiac silhouette. No acute osseous pathology. IMPRESSION: No active disease. Electronically Signed   By: Vanetta Chou M.D.   On: 04/06/2024 17:15      EKG: My personal interpretation of EKG shows: Normal sinus rhythm heart rate 94.  Right bundle branch block and left posterior fascicular block.    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  Per patient oral seizure lasted for 10 to 15 minutes which shakiness of the right upper extremity with gaze deviation while using her alert and moving the patient  Seizure subsided with Versed 2.5 mg by EMS.  At baseline patient communicate with eye movement and per patient's daughter report initially she was confused was not communicating with her eyes and currently she is gradually improving. At presentation to ED patient for borderline hypertensive, tachypneic 32 otherwise hemodynamically stable.  (History of chronic tachypnea and tachycardia secondary to central fever). - Recent hospital admission 2 weeks ago for acute CVA discharged to rehab recently. - EDP discussed case with on-call neurology recommended to loaded with Keppra 3 g followed by continue Keppra 500 mg twice daily.  Pending neurology formal recommendation for further treatment plan. -Lab work showed chronic leukocytosis 12.5 and chronic hyperkalemia 5.2 otherwise unremarkable. - CT head no acute endocrine abnormality evidence of previous stroke.  Chest x-ray unremarkable. -The ED patient has been loaded with IV Keppra 3 g.  Plan to continue IV Keppra 500 mg twice daily, IV Ativan as needed, seizure  precaution, aspiration precaution.  Continue cardiac monitoring. -Continue monitor improvement of mental status and coming back to baseline. -Appreciate neurology involvement in this patient case and will follow-up with recommendations.   History of acute CVA with residual bilateral weakness right > left Nonverbal at baseline Wheelchair and bedbound status -Continue aspirin  and Plavix , continue Lipitor .  Continue fall precaution.  History of chronic tachycardia and tachypnea -Continue propranolol  20 mg 3 times daily.  History of depression and anxiety -Currently not on medication at home.  Chronic hyperkalemia -Slightly better potassium 5.3.  Treated with Lokelma  10 g.  No EKG change.  Continue to monitor electrolytes.  History of sacral ulcer -Continue air bag mattress, turn patient every 2 hours, apply medhoney daily and consulted wound care for further management.   History of dysphagia secondary to CVA G-tube feeding dependent -Continue G-tube feeding and monitor for aspiration given patient has new onset of seizure.  Consulted dietitian.  History of obstructive sleep apnea not on CPAP - Continue check pulse ox supplemental oxygen as needed to keep O2 sat above 96% at bedtime.  History of prediabetes - A1c 6.1.  Currently debated on PEG tube feeding-Osmolite. -Continue check POC blood glucose every 6 hours.    DVT prophylaxis:  Lovenox  Code Status:  Full Code Diet: G-tube feeding dependent Family Communication:   Family was present at bedside, at the time of interview. Opportunity was given to ask question and all questions were answered satisfactorily.  Disposition Plan: Continue monitor development of any seizure. Consults: Neurology, dietitian  and wound care Admission status:   Observation, Telemetry bed  Severity of Illness: The appropriate patient status for this patient is OBSERVATION. Observation status is judged to be reasonable and necessary in order to  provide the required intensity of service to ensure the patient's safety. The patient's presenting symptoms, physical exam findings, and initial radiographic and laboratory data in the context of their medical condition is felt to place them at decreased risk for further clinical deterioration. Furthermore, it is anticipated that the patient will be medically stable for discharge from the hospital within 2 midnights of admission.     Jaxzen Vanhorn, MD [facility]  How to contact the [facility] Attending or Consulting provider 7A - 7P or covering provider during after hours 7P -7A, for this patient.  Check the care team in [facility] and look for a) attending/consulting [facility] provider listed and b) the [facility] team listed Log into [email or web address] and use [place]'s universal password to access. If you do not have the password, please contact the hospital operator. Locate the [facility] provider you are looking for under [facility] and page to a number that you can be directly reached. If you still have difficulty reaching the provider, please page the [facility] (Director on Call) for the Hospitalists listed on amion for assistance.  04/06/2024, 8:32 PM           [1]  Allergies Allergen Reactions   Lansoprazole Nausea And Vomiting   Penicillins Hives   Prozac [Fluoxetine] Hives   Bupropion Rash

## 2024-04-06 NOTE — ED Notes (Signed)
 Pt making grunting noises but still does not respond to anything and unable to follow commands.

## 2024-04-06 NOTE — Plan of Care (Signed)
 On-call neurology note Called by Dr. Ellouise in the ER Patient with known history of left ACA stroke now coming with first-time seizure. Recommend CT head-negative for acute process Labs unremarkable If the patient is back to baseline, can load her with Keppra IV 3 g x 1 and start Keppra 500 twice daily Maintain seizure precautions Outpatient follow-up with neurology If she does not come around and has more seizure activity, please reengage neurology for formal inpatient consultation  Eligio Lav, MD Neurology

## 2024-04-06 NOTE — Consult Note (Signed)
 NEUROLOGY CONSULT NOTE   Date of service: April 06, 2024 Patient Name: Marie Pham MRN:  [id number] DOB:  1946/04/18 Chief Complaint: New onset seizure Requesting Provider: Ellouise Richerd POUR, DO  History of Present Illness  Marie Pham is a 78 y.o. female with a PMHx of recent bilateral ACA strokes (left worse than right), now bedridden and nonverbal with right sided paralysis and significant weakness on the left, recent central fever and sympathetic storming secondary to her strokes during her stroke admission (abated with propranolol ), PEG placement, recent asthma, HTN, sleep apnea and depression who was brought in to the ED today via [facility] from [facility] for new onset of seizure activity. Staff at the facility was transferring pt to a new bed when she started having twitching movement to left arm and shoulder; her head was turned to the right side. Twitching movements lasted for approx 7-8 minutes. Eyes were deviated to the right. Movements had stopped before EMS arrived.  En route to the ED her abnormal seizure-like movements started again with mild twitching of the right  shoulder and arm. Per EMS, her vitals remained stable during the event. 2.5mg  of Versed was given en route, after which twitching movement stopped within 1-2 minutes. EMS vitals: BP 138/92; P 92; R 20; O2 100 2 liters; CBG 168.  Additional history was provided by her daughter, who works at her nursing home facility and states that about 45 minutes prior to arrival she was at her normal baseline where she is awake and alert, paralyzed on the right with significant weakness on the left. She states that the staff was moving her using a Hoyer lift from a smaller bed to a bigger bed and after they transferred her she started to develop shaking in the right upper extremity with gaze deviation. Her daughter thinks that the episode may have lasted 15 to 20 minutes and that her mom has been minimally responsive since.     She was recently admitted to her facility 2 days ago after her admission to [facility] for recent bilateral ACA territory strokes. Since her strokes, she has been abulic with no movement of her legs at her new baseline, will wake up when a family member enters the room but does not speak.   Dr. Allie recent progress note from 12/4 has been reviewed: Marie Pham is a 78 y.o. female with bilateral ACA territory infarcts, the left was much more involved than the right.  There is an irregularity right where the ACAs come off of the ACOM, and I am wondering if this finally occluded.  Bifrontal strokes can often result in severely diminished motivation and can result in akinetic mutism, and she does have significant bifrontal strokes. The strips listed as afib were reviewed and are not consistent with afib. There is a chance that she could have some gradual improvement over time, though this is not guaranteed. 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 Palliative has been engaging in extensive conversations with family about GOC. She is now partial DNR with intubation allowed. She will receive palliative care at hospital discharge but is not on hospice.    ROS  Unable to obtain due to obtundation.  Past History   Past Medical History:  Diagnosis Date   Asthma    Depression    Hypertension    Sleep apnea     Past Surgical History:  Procedure Laterality Date  ABDOMINAL HYSTERECTOMY     COLON SURGERY     EYE SURGERY Left    HERNIA REPAIR     IR GASTROSTOMY TUBE MOD SED  03/29/2024    Family History: Family History  Problem Relation Age of Onset   Hypertension Sister    Alcohol abuse Brother    Hypertension Brother    Diabetes Brother     Social History  reports that she quit smoking about 29 years ago. Her smoking use included cigarettes. She has never used smokeless tobacco. She reports that she does not drink alcohol and does not  use drugs.  Allergies[1]  Medications  Current Medications[2]  Vitals   Vitals:   04/06/24 1645 04/06/24 1800 04/06/24 1830 04/06/24 1845  BP: (!) 167/79 (!) 158/72 (!) 167/79 (!) 163/80  Pulse: 95 95 96 95  Resp: (!) 39 (!) 37 (!) 34 (!) 37  Temp:      TempSrc:      SpO2: 99% 100% 98% 100%  Weight:      Height:        Body mass index is 31.28 kg/m.   Physical Exam   Constitutional: Appears well-developed and well-nourished.  Psych: Obtunded Eyes: No scleral injection.  HENT: No OP obstruction.  Head: Normocephalic. No neck stiffness Respiratory: Effort normal, non-labored breathing.  Skin: No rash to face or arms.    Neurologic Examination   Mental Status: Obtunded. After 10 seconds of sustained sternal rub she opens eyes slightly, opens mouth and murmurs/moans unintelligibly. No movement of uppers to command, but will move semipurposefully to arm pinch and localizes to sternal rub bilaterally. No movement of lower extremities to any stimuli. Does not fixate visually or track. Does not follow any commands. Does not alert to her name and makes no attempts to communicate.  Cranial Nerves: II: No blink to threat bilaterally. Blank stare is noted after she opens eyes to sternal rub. PERRL  III,IV, VI: Eyes are conjugate and at the midline, without forced deviation or nystagmus. Partially suppressed doll's eye reflex.  V: Reacts to eyelid stimulation bilaterally  VII: Decreased NL fold on the right.  VIII: Unable to assess. Does not respond to voice. IX,X: Gag reflex deferred XI: Head is midline XII: Unable to assess. Does not follow commands.  Motor/Sensory: BUE will move slowly towards chest after 10 seconds of light sternal rub. Will also flex bilaterally to pinching of forearm and dorsal aspect of upper arm. Tone is decreased bilaterally. Arms drop to bed after passive elevation and release.  BLE with flaccid tone bilaterally. Does not move spontaneously or to noxious  pinch bilaterally.    Deep Tendon Reflexes: 1+ bilateral brachioradialis. 0 bilateral patellars.  Cerebellar: Unable to assess  Gait: Unable to assess Other: No jerking, twitching or other seizure-like activity appreciated.    Labs/Imaging/Neurodiagnostic studies   CBC:  Recent Labs  Lab April 20, 2024 0454 04/06/24 1706  WBC 10.9* 12.5*  NEUTROABS  --  9.4*  HGB 10.6* 12.0  HCT 35.2* 38.3  MCV 93.4 91.0  PLT 337 355   Basic Metabolic Panel:  Lab Results  Component Value Date   NA 138 04/06/2024   K 5.2 (H) 04/06/2024   CO2 40 (H) 04/06/2024   GLUCOSE 154 (H) 04/06/2024   BUN 20 04/06/2024   CREATININE 0.51 04/06/2024   CALCIUM  8.7 (L) 04/06/2024   GFRNONAA >60 04/06/2024   GFRAA >60 02/14/2017   Lipid Panel:  Lab Results  Component Value Date   LDLCALC 168 (  H) 03/18/2024   HgbA1c:  Lab Results  Component Value Date   HGBA1C 6.1 (H) 03/18/2024   Recent prior MRI (03/23/24): 1. Progressed bilateral ACA territory infarcts with cytotoxic edema, left greater than right. No hemorrhagic transformation. No significant mass effect. 2. Evolution of left caudate infarct with possible petechial hemorrhage, but no malignant hemorrhagic transformation or mass effect. 3. No new vascular territory ischemia. Chronic small vessel disease.  ASSESSMENT  78 y.o. female with a PMHx of recent bilateral ACA strokes (left worse than right), now bedridden and nonverbal with right sided paralysis and significant weakness on the left, recent central fever and sympathetic storming secondary to her strokes during her stroke admission (abated with propranolol ), PEG placement, recent asthma, HTN, sleep apnea and depression who was brought in to the ED today via [facility] from [facility] for new onset of seizure activity. Staff at the facility was transferring pt to a new bed when she started having twitching movement to left arm and shoulder; her head was turned to the right side. Twitching  movements lasted for approx 7-8 minutes. Eyes were deviated to the right. Movements had stopped before EMS arrived.  En route to the ED her abnormal seizure-like movements started again with mild twitching of the right  shoulder and arm. Per EMS, her vitals remained stable during the event. 2.5mg  of Versed was given en route, after which twitching movement stopped within 1-2 minutes.  - Exam reveals an obtunded female with paraplegia but ability to move upper extremities equally when localizing to pain. No clinical seizure-like activity appreciated.  - CT head: Stable appearance of the bilateral anterior cerebral artery territory infarcts. No evidence of hemorrhagic transformation. No acute intracranial process. - Labs: Na normal. K elevated at 5.2. Glucose 185. BUN and Cr normal. Ca is low at 8.5 in the context of low albumin of 2.4. LFTs normal. WBC 11.7.  - Impression: New onset of post-stroke seizure activity.     RECOMMENDATIONS  - EEG in AM (ordered) - Has been loaded with Keppra 3000 mg.  - Continue Keppra at 500 mg twice daily - Maintain seizure precautions - Continue her home ASA, Plavix  and atorvastatin   ______________________________________________________________________    Bonney SHARK, Joann Kulpa, MD [facility]     [1]  Allergies Allergen Reactions   Lansoprazole Nausea And Vomiting   Penicillins Hives   Prozac [Fluoxetine] Hives   Bupropion Rash  [2]  Current Facility-Administered Medications:    levETIRAcetam (KEPPRA) undiluted injection 3,000 mg, 3,000 mg, Intravenous, Once, Kingsley, Victoria K, DO   sodium zirconium cyclosilicate  (LOKELMA ) packet 10 g, 10 g, Per Tube, Once, Kingsley, Victoria K, DO  Current Outpatient Medications:    albuterol  (PROVENTIL  HFA;VENTOLIN  HFA) 108 (90 BASE) MCG/ACT inhaler, Inhale into the lungs., Disp: , Rfl:    ascorbic acid  (VITAMIN C ) 500 MG tablet, Place 1 tablet (500 mg total) into feeding tube 2 (two) times daily.,  Disp: , Rfl:    aspirin  81 MG chewable tablet, Place 1 tablet (81 mg total) into feeding tube daily., Disp: , Rfl:    atorvastatin  (LIPITOR ) 80 MG tablet, Place 1 tablet (80 mg total) into feeding tube daily., Disp: , Rfl:    brimonidine-timolol (COMBIGAN) 0.2-0.5 % ophthalmic solution, Apply 1 drop to eye every 12 (twelve) hours., Disp: , Rfl:    Cholecalciferol (VITAMIN D3) 1.25 MG (50000 UT) CAPS, Take 1 capsule by mouth once a week., Disp: , Rfl:    clopidogrel  (PLAVIX ) 75 MG tablet, Place 1 tablet (75  mg total) into feeding tube daily., Disp: , Rfl:    collagenase  (SANTYL ) 250 UNIT/GM ointment, Apply topically daily., Disp: , Rfl:    famotidine  (PEPCID ) 40 MG tablet, Take 1 tablet (40 mg total) by mouth every evening., Disp: 30 tablet, Rfl: 1   latanoprost  (XALATAN ) 0.005 % ophthalmic solution, Place 1 drop into both eyes at bedtime., Disp: , Rfl:    Menthol-Methyl Salicylate (MUSCLE RUB) 10-15 % CREA, Apply 1 Application topically as needed., Disp: 85 g, Rfl: 0   Multiple Vitamin (MULTIVITAMIN WITH MINERALS) TABS tablet, Place 1 tablet into feeding tube daily., Disp: , Rfl:    nutrition supplement, JUVEN, (JUVEN) PACK, Place 1 packet into feeding tube 2 (two) times daily between meals., Disp: , Rfl:    Nutritional Supplements (FEEDING SUPPLEMENT, OSMOLITE 1.2 CAL,) LIQD, Place 1,000 mLs into feeding tube continuous., Disp: , Rfl:    propranolol  (INDERAL ) 20 MG tablet, Place 1 tablet (20 mg total) into feeding tube 3 (three) times daily., Disp: , Rfl:    Protein (FEEDING SUPPLEMENT, PROSOURCE TF20,) liquid, Place 60 mLs into feeding tube daily., Disp: , Rfl:    Water  For Irrigation, Sterile (FREE WATER ) SOLN, Place 300 mLs into feeding tube every 4 (four) hours., Disp: , Rfl:    zinc  sulfate, 50mg  elemental zinc , 220 (50 Zn) MG capsule, Place 1 capsule (220 mg total) into feeding tube daily., Disp: , Rfl:

## 2024-04-06 NOTE — ED Triage Notes (Signed)
 Pt brought in by [facility] from [facility]. EMS states facility said they were transferring pt to a new bed when she started having twitching movement to left arm and should. Head was turned to the right side.Twitching movements lasted for approx 7-8 minutes. Eyes deviated to the right. Pt was recently admitted to facility 2 days ago from recent stroke. Reports no movement baseline except with her eyes. EMS arrived and movements had stopped before they arrive. In route to ED EMS states movement started again with mild twitching of the right  shoulder and arm. Repots vitals remained stable during the is event.  2.5mg  of Versed was given in route eports twitching movement stopped within 1-2 minutes. 18G to left AC.  EMS vitals BP 138/92 P 92 R 20 O2 100 2 liters CBG 168

## 2024-04-06 NOTE — ED Notes (Signed)
 Mickel daughter [phone]

## 2024-04-06 NOTE — ED Notes (Signed)
 Provider at bedside explaining plan of care to family. Pts eyes are now open but she is unable to follow with her eyes or follow simple commands.

## 2024-04-07 ENCOUNTER — Observation Stay (HOSPITAL_COMMUNITY)

## 2024-04-07 DIAGNOSIS — R569 Unspecified convulsions: Secondary | ICD-10-CM | POA: Diagnosis present

## 2024-04-07 DIAGNOSIS — L98429 Non-pressure chronic ulcer of back with unspecified severity: Secondary | ICD-10-CM | POA: Diagnosis present

## 2024-04-07 DIAGNOSIS — G40909 Epilepsy, unspecified, not intractable, without status epilepticus: Secondary | ICD-10-CM | POA: Diagnosis not present

## 2024-04-07 DIAGNOSIS — E875 Hyperkalemia: Secondary | ICD-10-CM | POA: Diagnosis present

## 2024-04-07 DIAGNOSIS — Z8249 Family history of ischemic heart disease and other diseases of the circulatory system: Secondary | ICD-10-CM | POA: Diagnosis not present

## 2024-04-07 DIAGNOSIS — I69398 Other sequelae of cerebral infarction: Secondary | ICD-10-CM | POA: Diagnosis not present

## 2024-04-07 DIAGNOSIS — R000 Tachycardia, unspecified: Secondary | ICD-10-CM | POA: Diagnosis present

## 2024-04-07 DIAGNOSIS — Z6831 Body mass index (BMI) 31.0-31.9, adult: Secondary | ICD-10-CM | POA: Diagnosis not present

## 2024-04-07 DIAGNOSIS — Z7982 Long term (current) use of aspirin: Secondary | ICD-10-CM | POA: Diagnosis not present

## 2024-04-07 DIAGNOSIS — E66811 Obesity, class 1: Secondary | ICD-10-CM | POA: Diagnosis present

## 2024-04-07 DIAGNOSIS — Z66 Do not resuscitate: Secondary | ICD-10-CM | POA: Diagnosis present

## 2024-04-07 DIAGNOSIS — R0682 Tachypnea, not elsewhere classified: Secondary | ICD-10-CM | POA: Diagnosis present

## 2024-04-07 DIAGNOSIS — Z7401 Bed confinement status: Secondary | ICD-10-CM | POA: Diagnosis not present

## 2024-04-07 DIAGNOSIS — Z888 Allergy status to other drugs, medicaments and biological substances status: Secondary | ICD-10-CM | POA: Diagnosis not present

## 2024-04-07 DIAGNOSIS — Z833 Family history of diabetes mellitus: Secondary | ICD-10-CM | POA: Diagnosis not present

## 2024-04-07 DIAGNOSIS — G8220 Paraplegia, unspecified: Secondary | ICD-10-CM | POA: Diagnosis present

## 2024-04-07 DIAGNOSIS — I69391 Dysphagia following cerebral infarction: Secondary | ICD-10-CM | POA: Diagnosis not present

## 2024-04-07 DIAGNOSIS — Z931 Gastrostomy status: Secondary | ICD-10-CM | POA: Diagnosis not present

## 2024-04-07 DIAGNOSIS — Z79899 Other long term (current) drug therapy: Secondary | ICD-10-CM | POA: Diagnosis not present

## 2024-04-07 DIAGNOSIS — Z7902 Long term (current) use of antithrombotics/antiplatelets: Secondary | ICD-10-CM | POA: Diagnosis not present

## 2024-04-07 DIAGNOSIS — L89323 Pressure ulcer of left buttock, stage 3: Secondary | ICD-10-CM | POA: Diagnosis present

## 2024-04-07 DIAGNOSIS — I69354 Hemiplegia and hemiparesis following cerebral infarction affecting left non-dominant side: Secondary | ICD-10-CM | POA: Diagnosis not present

## 2024-04-07 DIAGNOSIS — Z993 Dependence on wheelchair: Secondary | ICD-10-CM | POA: Diagnosis not present

## 2024-04-07 DIAGNOSIS — Z8673 Personal history of transient ischemic attack (TIA), and cerebral infarction without residual deficits: Secondary | ICD-10-CM | POA: Diagnosis not present

## 2024-04-07 DIAGNOSIS — I10 Essential (primary) hypertension: Secondary | ICD-10-CM | POA: Diagnosis present

## 2024-04-07 DIAGNOSIS — G9340 Encephalopathy, unspecified: Secondary | ICD-10-CM

## 2024-04-07 DIAGNOSIS — L89313 Pressure ulcer of right buttock, stage 3: Secondary | ICD-10-CM | POA: Diagnosis present

## 2024-04-07 DIAGNOSIS — Z87891 Personal history of nicotine dependence: Secondary | ICD-10-CM | POA: Diagnosis not present

## 2024-04-07 DIAGNOSIS — F05 Delirium due to known physiological condition: Secondary | ICD-10-CM | POA: Diagnosis not present

## 2024-04-07 LAB — COMPREHENSIVE METABOLIC PANEL WITH GFR
ALT: 32 U/L (ref 0–44)
AST: 34 U/L (ref 15–41)
Albumin: 2.4 g/dL — ABNORMAL LOW (ref 3.5–5.0)
Alkaline Phosphatase: 83 U/L (ref 38–126)
Anion gap: 8 (ref 5–15)
BUN: 20 mg/dL (ref 8–23)
CO2: 35 mmol/L — ABNORMAL HIGH (ref 22–32)
Calcium: 8.5 mg/dL — ABNORMAL LOW (ref 8.9–10.3)
Chloride: 92 mmol/L — ABNORMAL LOW (ref 98–111)
Creatinine, Ser: 0.55 mg/dL (ref 0.44–1.00)
GFR, Estimated: >60 mL/min (ref >60)
Glucose, Bld: 185 mg/dL — ABNORMAL HIGH (ref 70–99)
Potassium: 5.2 mmol/L — ABNORMAL HIGH (ref 3.5–5.1)
Sodium: 135 mmol/L (ref 135–145)
Total Bilirubin: 0.5 mg/dL (ref 0.0–1.2)
Total Protein: 6.6 g/dL (ref 6.5–8.1)

## 2024-04-07 LAB — GLUCOSE, CAPILLARY: Glucose-Capillary: 196 mg/dL — ABNORMAL HIGH (ref 70–99)

## 2024-04-07 LAB — CBC
HCT: 37.8 % (ref 36.0–46.0)
Hemoglobin: 12 g/dL (ref 12.0–15.0)
MCH: 29.1 pg (ref 26.0–34.0)
MCHC: 31.7 g/dL (ref 30.0–36.0)
MCV: 91.7 fL (ref 80.0–100.0)
Platelets: 365 K/uL (ref 150–400)
RBC: 4.12 MIL/uL (ref 3.87–5.11)
RDW: 13.6 % (ref 11.5–15.5)
WBC: 11.7 K/uL — ABNORMAL HIGH (ref 4.0–10.5)
nRBC: 0 % (ref 0.0–0.2)

## 2024-04-07 LAB — CBG MONITORING, ED: Glucose-Capillary: 186 mg/dL — ABNORMAL HIGH (ref 70–99)

## 2024-04-07 LAB — POTASSIUM: Potassium: 5.2 mmol/L — ABNORMAL HIGH (ref 3.5–5.1)

## 2024-04-07 MED ADMIN — Levetiracetam Inj 500 MG/5ML (100 MG/ML): 500 mg | INTRAVENOUS | NDC 00409188622

## 2024-04-07 MED ADMIN — Levetiracetam Inj 500 MG/5ML (100 MG/ML): 500 mg | INTRAVENOUS | NDC 72485010601

## 2024-04-07 NOTE — Progress Notes (Signed)
 NEUROLOGY CONSULT FOLLOW UP NOTE   Date of service: April 07, 2024 Patient Name: Marie Pham MRN:  [id number] DOB:  07/09/1945  Interval Hx/subjective  Seen and examined. Very lethargic According to daughter, at baseline she can track people.  She is not back at baseline but also sleeps a lot during the day since her stroke. Daughter reports that she is unable to move any of her extremities at baseline.  Vitals   Vitals:   04/07/24 1122 04/07/24 1130 04/07/24 1145 04/07/24 1216  BP: (!) 142/64 133/64 (!) 157/71   Pulse: 98 95 93   Resp:  (!) 32 (!) 26   Temp:  98.5 F (36.9 C)    TempSrc:  Axillary  Axillary  SpO2:  99% 99%   Weight:      Height:         Body mass index is 31.28 kg/m.  Physical Exam   General: Sleeping in bed HEENT: Normocephalic atraumatic Lungs: Clear Cardiovascular: Regular rhythm Neurological exam Obtunded Does not open eyes to voice but to noxious stimulation, has some eye-opening Does not follow commands Nonverbal at baseline Cranial nerves II to XII: Pupils equal round react light, extraocular movements difficult to assess given her inability to follow commands, face appears grossly symmetric. Motor examination: No movement spontaneously in any of the 4 extremities.  To noxious stimulation, no grimace Sensory: As above  Medications Current Medications[1]  Labs and Diagnostic Imaging   CBC:  Recent Labs  Lab 04/06/24 1706 04/07/24 0342  WBC 12.5* 11.7*  NEUTROABS 9.4*  --   HGB 12.0 12.0  HCT 38.3 37.8  MCV 91.0 91.7  PLT 355 365    Basic Metabolic Panel:  Lab Results  Component Value Date   NA 135 04/07/2024   K 5.2 (H) 04/07/2024   CO2 35 (H) 04/07/2024   GLUCOSE 185 (H) 04/07/2024   BUN 20 04/07/2024   CREATININE 0.55 04/07/2024   CALCIUM  8.5 (L) 04/07/2024   GFRNONAA >60 04/07/2024   GFRAA >60 02/14/2017   Lipid Panel:  Lab Results  Component Value Date   LDLCALC 168 (H) 03/18/2024   HgbA1c:  Lab Results   Component Value Date   HGBA1C 6.1 (H) 03/18/2024  CT Head without contrast(Personally reviewed): Stable appearance of bilateral ACA infarcts.  No evidence of hemorrhagic transformation.  rEEG-continuous generalized slowing, study suggestive of global cerebral dysfunction/encephalopathy.  Assessment   Marie Pham is a 78 y.o. female has history of bilateral ACA strokes, nonverbal, bedridden at baseline, hypertension, sleep apnea, depression, brought in for concern for right sided seizure activity. Routine EEG negative for ongoing seizure activity Clinically very obtunded but according to the daughter has been sleeping a lot lately and can barely track at baseline-nonverbal and bedridden at baseline.  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  Recommendations  Seizure precautions Continue Keppra 500 twice daily Continue home aspirin  Plavix  and statin Outpatient follow-up as per prior plans. Can await some improvement in mentation and then discharge to the nursing facility. Not much to offer further, other than starting antiepileptics, from an inpatient standpoint. Plan was relayed to Dr. Debrah ______________________________________________________________________   Signed, Eligio Lav, MD [facility]     [1]  Current Facility-Administered Medications:  0.9 %  sodium chloride  infusion, 250 mL, Intravenous, PRN, Sundil, Subrina, MD   acetaminophen  (TYLENOL ) tablet 650 mg, 650 mg, Per Tube, Q6H PRN **OR** acetaminophen  (TYLENOL ) suppository 650 mg, 650 mg, Rectal, Q6H PRN, Sundil, Subrina, MD   ascorbic acid  (VITAMIN C ) tablet 500 mg, 500 mg, Per Tube,  BID, Sundil, Subrina, MD, 500 mg at 04/07/24 1122   aspirin  chewable tablet 81 mg, 81 mg, Per Tube, Daily, Sundil, Subrina, MD, 81 mg at 04/07/24 1122   atorvastatin  (LIPITOR ) tablet 80 mg, 80 mg, Per Tube, Daily, Sundil, Subrina, MD, 80 mg at 04/07/24 1122   clopidogrel  (PLAVIX ) tablet 75 mg, 75 mg, Per Tube, Daily, Sundil, Subrina, MD, 75 mg at 04/07/24 1122   dextrose  50 % solution 50 mL, 1 ampule, Intravenous, PRN, Sundil, Subrina, MD   enoxaparin  (LOVENOX ) injection 40 mg, 40 mg, Subcutaneous, Q24H, Sundil, Subrina, MD, 40 mg at 04/07/24 1135   feeding supplement (OSMOLITE 1.2 CAL) liquid 1,000 mL, 1,000 mL, Per Tube, Continuous, Sundil, Subrina, MD, Last Rate: 60 mL/hr at 04/07/24 0030, 1,000 mL at 04/07/24 0030   feeding supplement (PROSource TF20) liquid 60 mL, 60 mL, Per Tube, Daily, Sundil, Subrina, MD, 60 mL at 04/07/24 1243   free water  300 mL, 300 mL, Per Tube, Q4H, Sundil, Subrina, MD, 300 mL at 04/07/24 1243   leptospermum manuka honey (MEDIHONEY) paste 1 Application, 1 Application, Topical, Daily, Sundil, Subrina, MD, 1 Application at 04/07/24 1000   levalbuterol (XOPENEX) nebulizer solution 0.63 mg, 0.63 mg, Nebulization, Q6H PRN, Sundil, Subrina, MD   levETIRAcetam (KEPPRA) undiluted injection 500 mg, 500 mg, Intravenous, Q12H, Sundil, Subrina, MD, 500 mg at 04/07/24 0615   LORazepam (ATIVAN) injection 2 mg, 2 mg, Intravenous, Q6H PRN, Sundil, Subrina, MD   multivitamin with minerals tablet 1 tablet, 1 tablet, Per Tube, Daily, Sundil, Subrina, MD, 1 tablet at 04/07/24 1122   nutrition supplement (JUVEN) (JUVEN) powder packet 1 packet, 1 packet, Per Tube, BID BM, Sundil, Subrina, MD, 1 packet at 04/07/24 1243   ondansetron  (ZOFRAN ) tablet 4 mg, 4 mg, Per Tube, Q6H PRN **OR** ondansetron  (ZOFRAN ) injection 4 mg, 4 mg, Intravenous, Q6H PRN, Sundil, Subrina, MD   propranolol  (INDERAL ) tablet 20 mg, 20 mg, Per Tube, TID, Sundil, Subrina, MD, 20 mg at 04/07/24 1122   sodium chloride   flush (NS) 0.9 % injection 3 mL, 3 mL, Intravenous, Q12H, Sundil, Subrina, MD, 3 mL at 04/07/24 1134   sodium chloride  flush (NS) 0.9 % injection 3 mL, 3 mL, Intravenous, Q12H, Sundil, Subrina, MD, 3 mL at 04/07/24 1121   sodium chloride  flush (NS) 0.9 % injection 3 mL, 3 mL, Intravenous, PRN, Sundil, Subrina, MD   Vitamin D (Ergocalciferol) (DRISDOL) 1.25 MG (50000 UNIT) capsule 50,000 Units, 50,000 Units, Oral, Weekly, Sundil, Subrina, MD   zinc  sulfate (50mg  elemental zinc ) capsule 220 mg, 220 mg, Per Tube, Daily, Sundil, Subrina, MD, 220 mg at 04/07/24 1122

## 2024-04-07 NOTE — Inpatient Diabetes Management (Signed)
 Inpatient Diabetes Program Recommendations  AACE/ADA: New Consensus Statement on Inpatient Glycemic Control (2015)  Target Ranges:  Prepandial:   less than 140 mg/dL      Peak postprandial:   less than 180 mg/dL (1-2 hours)      Critically ill patients:  140 - 180 mg/dL   Lab Results  Component Value Date   GLUCAP 196 (H) 04/07/2024   HGBA1C 6.1 (H) 03/18/2024    Review of Glycemic Control  Latest Reference Range & Units 04/06/24 17:12 04/07/24 08:14 04/07/24 12:12  Glucose-Capillary 70 - 99 mg/dL 849 (H) 813 (H) 803 (H)   A1c 6.1% on 11/22 Current orders for Inpatient glycemic control:  None  Osmolite 60 ml/hour  Inpatient Diabetes Program Recommendations:    -   Consider Novolog  0-6 units Q4 hours while on tube feeds  Thanks,  Clotilda Bull RN, MSN, BC-ADM Inpatient Diabetes Coordinator Team Pager [phone] (8a-5p)

## 2024-04-07 NOTE — Procedures (Addendum)
 Patient Name: ARIANAH TORGESON  MRN: [id number]  Epilepsy Attending: Arlin MALVA Krebs  Referring Physician/Provider: Merrianne Locus, MD  Date: 04/07/2024 Duration: 23.10 mins  Patient history: 78 y.o. female with a PMHx of recent bilateral ACA strokes (left worse than right) who was brought in to the ED today for new onset of seizure activity. EEG to evaluate for seizure  Level of alertness: Awake/ lethargic  AEDs during EEG study: LEV  Technical aspects: This EEG study was done with scalp electrodes positioned according to the 10-20 International system of electrode placement. Electrical activity was reviewed with band pass filter of 1-70Hz , sensitivity of 7 uV/mm, display speed of 63mm/sec with a 60Hz  notched filter applied as appropriate. EEG data were recorded continuously and digitally stored.  Video monitoring was available and reviewed as appropriate.  Description: EEG showed continuous generalized predominantly 5 to 7hz  theta slowing admixed with intermittent 2-3hz  delta slowing. Hyperventilation and photic stimulation were not performed.     ABNORMALITY - Continuous slow, generalized  IMPRESSION: This study is suggestive of generalized cerebral dysfunction (encephalopathy). No seizures or epileptiform discharges were seen throughout the recording.  Nicolas Sisler O Demonica Farrey

## 2024-04-07 NOTE — Progress Notes (Addendum)
 PROGRESS NOTE    Marie Pham  FMW:[id number] DOB: 1945/10/08 DOA: 04/06/2024 PCP: Marie Domino, MD   Brief Narrative:  HPI:  Marie Pham is a 78 y.o. 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  Her daughter works at her nursing home facility and states that about 45 minutes prior to arrival she was at her normal baseline where she is awake and alert, paralyzed on the right with significant weakness on the left.  She states that the staff was moving her using a Hoyer lift from a smaller bed to a bigger bed and after they transferred her she started to develop shaking in the right upper extremity with gaze deviation.  Her daughter thinks that the episode may have lasted 10 to 15 minutes and that her mom has been minimally responsive since.  She did receive 2.5 mg of Versed by EMS with resolution of the shaking.   In the ED neurology being consulted recommended head CT to rule out acute process,.  Patient's labs is unremarkable.  Hemodynamically stable.  Per neurology recommendation if patient comes back to baseline loaded with Keppra 3 g followed by initiate Keppra 500 mg twice daily, seizure precaution and if patient does not comes back to her baseline neurology will do formal consultation.   As per patient's family she is still confused and has not been her baseline (per  patient's daughter she is alert oriented at baseline even though she is paralyzed on the right side with significant weakness on the left).  Inpatient neurology has been consulted for formal evaluation and pending recommendation.   During my evaluation at bedside patient is drowsy and sleepy as she received Versed earlier.  Per patient's daughter she is able to communicate with her eyes however she will not understand all voice, and only few verbal cues she understand.     ED Course: At presentation to ED patient is hemodynamically stable except blood pressure is borderline elevated and history of chronic tachypnea respiratory between 37-39.  2 L oxygen O2 sat 98 to 100%. CBC showing chronic leukocytosis 12.5 otherwise unremarkable.  CMP showing slightly elevated potassium 5.2, low chloride 91, elevated bicarb 40 and low albumin 2.5.  Normal mag and Phos level.  UA unremarkable.   Chest x-ray no active disease process. CT head no acute intracranial abnormality.Stable appearance of the bilateral anterior cerebral artery territory infarcts. No evidence of hemorrhagic transformation.   In the ED patient has been loaded with Keppra and received Lokelma  10 g. Per patient's daughter at baseline patient is nonverbal however she used eye for communication/contacts and right now patient is gradually improving. EDP discussed with on-call neurology given patient still has not came back to baseline and postictal state recommended inpatient admission.   Hospitalist consulted for further evaluation management of postictal state, acute metabolic encephalopathy in the setting of seizure, new onset of seizure and hyperkalemia.  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 CT head unremarkable.  Per neurology recommendations, patient loaded with Keppra 3 g followed by continue Keppra 500 mg twice daily.  Pending neurology formal recommendation for further treatment plan.   History of acute CVA with residual bilateral weakness right > left Nonverbal at baseline Wheelchair and bedbound status -Continue aspirin  and Plavix , continue Lipitor .  Continue fall precaution.   History of chronic tachycardia and tachypnea She had these during recent hospitalization as well as fever and they were considered to be central fevers and possible sympathetic hyperactivity, she was discharged on propranolol  20 mg 3 times daily.  Patient currently not tachycardic but remains tachypneic.   History of depression and anxiety -Currently not on medication at home.   Chronic hyperkalemia -Slightly better potassium 5.2 today.  Already treated with Lokelma  in the ED.  No EKG change.  Continue to monitor electrolytes.  Recheck potassium later today.  She also had hyperkalemia during recent hospitalization.   History of sacral ulcer -Continue air bag mattress, turn patient every 2 hours, apply medhoney daily and consulted wound care for further management.   History of dysphagia secondary to CVA G-tube feeding dependent -Continue G-tube feeding and monitor for aspiration given patient has new onset of seizure.  Consulted dietitian.  Dysphagia secondary to CVA - PEG tube placed on 12/3 during previous hospitalization.  Continue tube feeds.  History of obstructive sleep apnea not on CPAP - Continue check pulse ox supplemental oxygen as needed to keep O2 sat above 96% at bedtime.   History of prediabetes - A1c 6.1.  Currently on PEG tube  feeding-Osmolite. -Continue check POC blood glucose every 6 hours.   DVT prophylaxis: enoxaparin  (LOVENOX ) injection 40 mg Start: 04/07/24 1000 SCDs Start: 04/06/24 2017 Place TED hose Start: 04/06/24 2017   Code Status: Full Code  Family Communication:  None present at bedside.   Status is: Observation The patient will require care spanning > 2 midnights and should be moved to inpatient because: Patient very lethargic   Estimated body mass index is 31.28 kg/m as calculated from the following:   Height as of this encounter: 5' 5 (1.651 m).   Weight as of this encounter: 85.3 kg.  Wound 03/23/24 2100 Pressure Injury Buttocks Right;Left Stage 3 -  Full thickness tissue loss. Subcutaneous fat may be visible but bone, tendon or muscle are NOT exposed. (Active)   Nutritional Assessment: Body mass index is 31.28 kg/m.SABRA Seen by dietician.  I agree with the assessment and plan as outlined below: Nutrition Status:        . Skin Assessment: I have examined the patient's skin and I agree with the wound assessment as performed by the wound care RN as outlined below: Wound 03/23/24 2100 Pressure Injury Buttocks Right;Left Stage 3 -  Full thickness tissue loss. Subcutaneous fat may be visible but bone, tendon or muscle are NOT exposed. (Active)    Consultants:  Neurology  Procedures:  As above  Antimicrobials:  Anti-infectives (From admission, onward)    None         Subjective: Patient seen and examined, she was being placed leads for EEG.  Patient was very lethargic, unable to open her eyes or have any conversation with me.  No family members were present at the bedside.  Objective: Vitals:   04/06/24 1945 04/06/24 2000 04/07/24 0100 04/07/24 0445  BP: (!) 153/77 (!) 162/70 (!) 170/78 (!) 152/76  Pulse: 97 95 87 93  Resp: (!) 32 (!) 32 (!) 32 (!) 37  Temp:   99.1 F (37.3 C) 98.4 F (36.9 C)  TempSrc:   Axillary Axillary  SpO2: 100% 100% 100% 91%  Weight:       Height:        Intake/Output Summary (Last 24 hours) at 04/07/2024 0735 Last data filed at 04/07/2024 0430 Gross per 24 hour  Intake 600 ml  Output 200 ml  Net 400 ml   Filed Weights   04/06/24 1544  Weight: 85.3 kg    Examination:  General exam: Appears quite lethargic and somnolent Respiratory system: Clear to auscultation. Respiratory effort normal.  Audible upper respiratory sounds Cardiovascular system: S1 & S2 heard, RRR. No JVD, murmurs, rubs, gallops or clicks. No pedal edema. Gastrointestinal system: Abdomen is nondistended, soft and nontender. No organomegaly or masses felt. Normal bowel sounds heard. Central nervous system: Somnolent, not oriented.  Not following commands.  Data Reviewed: I have personally reviewed following labs and imaging studies  CBC: Recent Labs  Lab 04/01/24 0621 04/02/24 0557 04/03/24 0454 04/06/24 1706 04/07/24 0342  WBC 13.9* 11.8* 10.9* 12.5* 11.7*  NEUTROABS  --   --   --  9.4*  --   HGB 11.1* 11.0* 10.6* 12.0 12.0  HCT 36.2 35.5* 35.2* 38.3 37.8  MCV 94.3 93.2 93.4 91.0 91.7  PLT 305 323 337 355 365   Basic Metabolic Panel: Recent Labs  Lab 04/02/24 0557 04/03/24 0454 04/04/24 0358 04/06/24 1706 04/07/24 0342  NA 147* 143 140 138 135  K 5.1 5.3* 5.2* 5.2* 5.2*  CL 106 101 96* 91* 92*  CO2 37* 38* 39* 40* 35*  GLUCOSE 180* 142* 126* 154* 185*  BUN 34* 32* 34* 20 20  CREATININE 0.52 0.47 0.39* 0.51 0.55  CALCIUM  9.3 10.4* 9.2 8.7* 8.5*  MG 2.8*  --  2.6* 2.3  --   PHOS 3.1  --   --  3.9  --    GFR: Estimated Creatinine Clearance: 65.5 mL/min (by C-G formula based on SCr of 0.55 mg/dL). Liver Function Tests: Recent Labs  Lab 04/06/24 1706 04/07/24 0342  AST 34 34  ALT 34 32  ALKPHOS 82 83  BILITOT 0.7 0.5  PROT 6.6 6.6  ALBUMIN 2.5* 2.4*   No results for input(s): LIPASE, AMYLASE in the last 168 hours. No results for input(s): AMMONIA in the last 168 hours. Coagulation Profile: No results for  input(s): INR, PROTIME in the last 168 hours. Cardiac Enzymes: No results for input(s): CKTOTAL, CKMB, CKMBINDEX, TROPONINI in the last 168 hours. BNP (last 3 results) Recent Labs    03/29/24 0449  PROBNP 286.0   HbA1C: No results for input(s): HGBA1C in the last 72 hours. CBG: Recent Labs  Lab 04/04/24 0420 04/04/24 0735 04/04/24 1139 04/04/24 1601 04/06/24 1712  GLUCAP 147* 156* 100* 179* 150*   Lipid Profile: No results for input(s): CHOL, HDL, LDLCALC, TRIG, CHOLHDL, LDLDIRECT in the last 72 hours. Thyroid  Function Tests: No results for input(s): TSH, T4TOTAL, FREET4, T3FREE, THYROIDAB in the last 72 hours. Anemia Panel: No results for input(s): VITAMINB12, FOLATE, FERRITIN, TIBC, IRON, RETICCTPCT in the last 72 hours. Sepsis Labs: No  results for input(s): PROCALCITON, LATICACIDVEN in the last 168 hours.  Recent Results (from the past 240 hours)  C Difficile Quick Screen (NO PCR Reflex)     Status: None   Collection Time: 03/28/24  9:15 AM   Specimen: STOOL  Result Value Ref Range Status   C Diff antigen NEGATIVE NEGATIVE Final   C Diff toxin NEGATIVE NEGATIVE Final   C Diff interpretation No C. difficile detected.  Final    Comment: Performed at [facility], [address] [place], [place] [address]  Culture, blood (Routine X 2) w Reflex to ID Panel     Status: None   Collection Time: 03/28/24 10:46 AM   Specimen: BLOOD RIGHT HAND  Result Value Ref Range Status   Specimen Description BLOOD RIGHT HAND  Final   Special Requests   Final    BOTTLES DRAWN AEROBIC AND ANAEROBIC Blood Culture adequate volume   Culture   Final    NO GROWTH 5 DAYS Performed at [facility], [address] [place], [place] [address]    Report Status 04/02/2024 FINAL  Final  Culture, blood (Routine X 2) w Reflex to ID Panel     Status: None   Collection Time: 03/28/24 10:46 AM   Specimen: BLOOD RIGHT ARM  Result  Value Ref Range Status   Specimen Description BLOOD RIGHT ARM  Final   Special Requests   Final    BOTTLES DRAWN AEROBIC AND ANAEROBIC Blood Culture adequate volume   Culture   Final    NO GROWTH 5 DAYS Performed at [facility], [address], [place], [place] [address]    Report Status 04/02/2024 FINAL  Final     Radiology Studies: CT HEAD WO CONTRAST Result Date: 04/06/2024 CLINICAL DATA:  Seizure EXAM: CT HEAD WITHOUT CONTRAST TECHNIQUE: Contiguous axial images were obtained from the base of the skull through the vertex without intravenous contrast. RADIATION DOSE REDUCTION: This exam was performed according to the departmental dose-optimization program which includes automated exposure control, adjustment of the mA and/or kV according to patient size and/or use of iterative reconstruction technique. COMPARISON:  03/30/2024 FINDINGS: Brain: Grossly stable appearance of the bilateral frontal lobe infarcts noted on prior study. No evidence of hemorrhagic transformation. No new areas of infarct. Lateral ventricles and midline structures are otherwise unremarkable. No acute extra-axial fluid collections. No mass effect. Vascular: No hyperdense vessel or unexpected calcification. Skull: Normal. Negative for fracture or focal lesion. Sinuses/Orbits: No acute finding. Other: None. IMPRESSION: 1. Stable appearance of the bilateral anterior cerebral artery territory infarcts. No evidence of hemorrhagic transformation. 2. No acute intracranial process. Electronically Signed   By: Ozell Daring M.D.   On: 04/06/2024 17:20   DG Chest Port 1 View Result Date: 04/06/2024 CLINICAL DATA:  Altered mental status. EXAM: PORTABLE CHEST 1 VIEW COMPARISON:  Chest radiograph dated 03/29/2024. FINDINGS: No focal consolidation, pleural effusion or pneumothorax. Stable cardiac silhouette. No acute osseous pathology. IMPRESSION: No active disease. Electronically Signed   By: Vanetta Chou M.D.   On:  04/06/2024 17:15    Scheduled Meds:  ascorbic acid   500 mg Per Tube BID   aspirin   81 mg Per Tube Daily   atorvastatin   80 mg Per Tube Daily   clopidogrel   75 mg Per Tube Daily   enoxaparin  (LOVENOX ) injection  40 mg Subcutaneous Q24H   feeding supplement (PROSource TF20)  60 mL Per Tube Daily   free water   300 mL Per Tube Q4H  leptospermum manuka honey  1 Application Topical Daily   levETIRAcetam  500 mg Intravenous Q12H   multivitamin with minerals  1 tablet Per Tube Daily   nutrition supplement (JUVEN)  1 packet Per Tube BID BM   propranolol   20 mg Per Tube TID   sodium chloride  flush  3 mL Intravenous Q12H   sodium chloride  flush  3 mL Intravenous Q12H   Vitamin D (Ergocalciferol)  50,000 Units Oral Weekly   zinc  sulfate (50mg  elemental zinc )  220 mg Per Tube Daily   Continuous Infusions:  sodium chloride      feeding supplement (OSMOLITE 1.2 CAL) 1,000 mL (04/07/24 0030)     LOS: 0 days   Fredia Skeeter, MD [facility]  04/07/2024, 7:35 AM   *Please note that this is a verbal dictation therefore any spelling or grammatical errors are due to the Dragon Medical One system interpretation.  Please page via Amion and do not message via secure chat for urgent patient care matters. Secure chat can be used for non urgent patient care matters.  How to contact the [facility] Attending or Consulting provider 7A - 7P or covering provider during after hours 7P -7A, for this patient?  Check the care team in [facility] and look for a) attending/consulting TR[facility] provider listed and b) the [facility] team listed. Page or secure chat 7A-7P. Log into [email or web address] and use [place]'s universal password to access. If you do not have the password, please contact the hospital operator. Locate the [facility] provider you are looking for under [facility] and page to a number that you can be directly reached. If you still have difficulty reaching the provider, please page the [facility] (Director on Call) for the  Hospitalists listed on amion for assistance.

## 2024-04-07 NOTE — TOC Initial Note (Signed)
 Transition of Care [facility]) - Initial/Assessment Note    Patient Details  Name: Marie Pham MRN: [id number] Date of Birth: [birth year]  Transition of Care [facility]) CM/SW Contact:    Nena LITTIE Coffee, RN Phone Number: 04/07/2024, 11:03 AM  Clinical Narrative:                 Pt from [facility] admitted on 04/04/2024 after hospitalization for CVA (03/18/2024). Prior to 11/22 hospitalization pt lived at home c/her niece, was ambulatory c/a walker and verbally communicative. Pt suffered a bilateral anterior cerebral artery stroke, L>R which has left her non-verbal, bedridden c/right sided paralysis, significant left sided weakness, PEG tube (03/29/2024), and experiencing paroxysmal sympathetic hyperactivity.   Pt is active c/[facility] palliative.   Expected Discharge Plan: Long Term Nursing Home Barriers to Discharge: Continued Medical Work up   Patient Goals and CMS Choice Patient states their goals for this hospitalization and ongoing recovery are:: unable to assess CMS Medicare.gov Compare Post Acute Care list provided to:: Patient Represenative (must comment) [name] (daughter)) Choice offered to / list presented to : Adult Children  ownership interest in [facility].provided to:: Adult Children    Expected Discharge Plan and Services In-house Referral: Clinical Social Work Discharge Planning Services: CM Consult Post Acute Care Choice: Skilled Nursing Facility Living arrangements for the past 2 months: Skilled Nursing Facility, Single Family Home                                      Prior Living Arrangements/Services Living arrangements for the past 2 months: Skilled Nursing Facility, Single Family Home Lives with:: Self, Relatives Patient language and need for interpreter reviewed:: Yes        Need for Family Participation in Patient Care: Yes (Comment) Care giver support system in place?: Yes (comment) Current home services: DME  (walker, air mattress) Criminal Activity/Legal Involvement Pertinent to Current Situation/Hospitalization: No - Comment as needed  Activities of Daily Living      Permission Sought/Granted                  Emotional Assessment Appearance:: Appears stated age Attitude/Demeanor/Rapport: Unable to Assess (somnolent) Affect (typically observed): Unable to Assess (somnolent)   Alcohol / Substance Use: Not Applicable Psych Involvement: No (comment)  Admission diagnosis:  New onset seizure (HCC) [R56.9] Patient Active Problem List   Diagnosis Date Noted   New onset seizure (HCC) 04/06/2024   Postictal confusion 04/06/2024   Hyperkalemia 04/06/2024   History of CVA (cerebrovascular accident) 04/06/2024   History of asthma 04/06/2024   Chronic tachycardia 04/06/2024   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 04/06/2024   G tube feedings (HCC) 04/06/2024   Sacral ulcer (HCC) 04/06/2024   Severe aortic stenosis 03/19/2024   Pneumonitis 03/19/2024   AKI (acute kidney injury) 03/18/2024   Hypernatremia 03/18/2024   Abnormal EKG 03/18/2024   SIRS (systemic inflammatory response syndrome) (HCC) 03/18/2024   Acute CVA (cerebrovascular accident) (HCC) 03/17/2024   BP (high blood pressure) 10/02/2014   Asthma, chronic 10/02/2014   Essential hypertension 10/02/2014   Obesity 10/02/2014   Sleep apnea 10/02/2014   PCP:  Tobie Domino, MD Pharmacy:   [facility] - [place], [place] - [address] [place] [address] Phone: [phone] Fax: [phone]  [id number] - [place], [place] - [address]  [place] [address] Phone: [phone] Fax: [phone]  [facility] - [place], [place] - [address] [place] [address] Phone: [phone] Fax: [phone]     Social Drivers of Health (SDOH) Social History: SDOH Screenings   Food Insecurity:  Patient Unable To Answer (03/18/2024)  Housing: Patient Unable To Answer (03/18/2024)  Transportation Needs: Patient Unable To Answer (03/18/2024)  Utilities: Patient Unable To Answer (03/18/2024)  Financial Resource Strain: High Risk (09/01/2023)   Received from [facility]  Social Connections: Patient Unable To Answer (03/18/2024)  Tobacco Use: Medium Risk (04/06/2024)   SDOH Interventions:     Readmission Risk Interventions     No data to display

## 2024-04-07 NOTE — ED Notes (Signed)
 Wound care at bedside.

## 2024-04-07 NOTE — Consult Note (Signed)
 WOC Nurse Consult Note: Reason for Consult: Chronic sacral wound  Wound type:  Intertriginous dermatitis to R/L breast Stage 3 pressure injury to bilateral buttocks Pressure Injury POA: Yes Measurement: see nursing flow sheets Wound bed: 60% red, moist 40 % yellow/black slough Drainage (amount, consistency, odor) sanguinous Periwound: intact Dressing procedure/placement/frequency:  Buttocks: Cleanse with Vashe (lawson # [id number]) allow to air dry.  Apply 1/4 thick layer of leptospermum honey to wound bed, top with dry gauze and cover with silicone foam, change daily. Ok to lift silicone foam to reapply Medihoney daily.  Cleanse beneath Bilateral breasts with warm water  and soap, dry thoroughly.  Apply Order Gerlean # [id number] Measure and cut length of InterDry to fit in skin folds that have skin breakdown Tuck InterDry fabric into skin folds in a single layer, allow for 2 inches of overhang from skin edges to allow for wicking to occur May remove to bathe; dry area thoroughly and then tuck into affected areas again Do not apply any creams or ointments when using InterDry DO NOT THROW AWAY FOR 5 DAYS unless soiled with stool DO NOT [facility] product, this will inactivate the silver in the material  New sheet of Interdry should be applied after 5 days of use if patient continues to have skin breakdown           WOC team will not follow patient at this time, please re consult if new needs arise or wound deteriorates.  Thank you,  Doyal Polite, MSN, RN, [facility] WOC Team [phone] (Available Mon-Fri 0700-1500)

## 2024-04-07 NOTE — Progress Notes (Signed)
 EEG complete - results pending

## 2024-04-08 LAB — CBC WITH DIFFERENTIAL/PLATELET
Abs Immature Granulocytes: 0.04 K/uL (ref 0.00–0.07)
Basophils Absolute: 0.1 K/uL (ref 0.0–0.1)
Basophils Relative: 1 %
Eosinophils Absolute: 0.2 K/uL (ref 0.0–0.5)
Eosinophils Relative: 2 %
HCT: 35.8 % — ABNORMAL LOW (ref 36.0–46.0)
Hemoglobin: 11.6 g/dL — ABNORMAL LOW (ref 12.0–15.0)
Immature Granulocytes: 0 %
Lymphocytes Relative: 20 %
Lymphs Abs: 2 K/uL (ref 0.7–4.0)
MCH: 29.2 pg (ref 26.0–34.0)
MCHC: 32.4 g/dL (ref 30.0–36.0)
MCV: 90.2 fL (ref 80.0–100.0)
Monocytes Absolute: 0.8 K/uL (ref 0.1–1.0)
Monocytes Relative: 8 %
Neutro Abs: 6.7 K/uL (ref 1.7–7.7)
Neutrophils Relative %: 69 %
Platelets: 362 K/uL (ref 150–400)
RBC: 3.97 MIL/uL (ref 3.87–5.11)
RDW: 13.5 % (ref 11.5–15.5)
WBC: 9.8 K/uL (ref 4.0–10.5)
nRBC: 0 % (ref 0.0–0.2)

## 2024-04-08 LAB — BASIC METABOLIC PANEL WITH GFR
Anion gap: 5 (ref 5–15)
BUN: 29 mg/dL — ABNORMAL HIGH (ref 8–23)
CO2: 39 mmol/L — ABNORMAL HIGH (ref 22–32)
Calcium: 9 mg/dL (ref 8.9–10.3)
Chloride: 94 mmol/L — ABNORMAL LOW (ref 98–111)
Creatinine, Ser: 0.49 mg/dL (ref 0.44–1.00)
GFR, Estimated: >60 mL/min (ref >60)
Glucose, Bld: 173 mg/dL — ABNORMAL HIGH (ref 70–99)
Potassium: 4.9 mmol/L (ref 3.5–5.1)
Sodium: 138 mmol/L (ref 135–145)

## 2024-04-08 LAB — GLUCOSE, CAPILLARY
Glucose-Capillary: 149 mg/dL — ABNORMAL HIGH (ref 70–99)
Glucose-Capillary: 193 mg/dL — ABNORMAL HIGH (ref 70–99)

## 2024-04-08 MED ORDER — OSMOLITE 1.2 CAL PO LIQD
1000.0000 mL | ORAL | Status: DC
  Filled 2024-04-08: qty 1000

## 2024-04-08 MED ORDER — LATANOPROST 0.005 % OP SOLN
1.0000 [drp] | Freq: Every day | OPHTHALMIC | Status: DC
  Filled 2024-04-08: qty 2.5

## 2024-04-08 MED ORDER — LEVETIRACETAM 100 MG/ML PO SOLN: 500.0000 mg | Freq: Two times a day (BID) | ORAL | Status: AC

## 2024-04-08 MED ORDER — VITAMIN C 500 MG PO TABS: 500.0000 mg | ORAL_TABLET | Freq: Two times a day (BID) | ORAL | Status: DC

## 2024-04-08 MED ORDER — ZINC SULFATE 220 (50 ZN) MG PO CAPS: 220.0000 mg | ORAL_CAPSULE | Freq: Every day | ORAL | Status: DC

## 2024-04-08 MED ORDER — MEDIHONEY WOUND/BURN DRESSING EX PSTE: 1.0000 | PASTE | Freq: Every day | CUTANEOUS | Status: AC

## 2024-04-08 MED ORDER — ERGOCALCIFEROL 200 MCG/ML PO SOLN: 50000.0000 [IU] | ORAL | Status: DC

## 2024-04-08 MED ORDER — PROSOURCE TF20 ENFIT COMPATIBL EN LIQD: 60.0000 mL | Freq: Two times a day (BID) | ENTERAL | Status: DC

## 2024-04-08 MED ADMIN — Levetiracetam Inj 500 MG/5ML (100 MG/ML): 500 mg | INTRAVENOUS | NDC 72485010601

## 2024-04-08 NOTE — Discharge Summary (Signed)
 PATIENT DETAILS Name: Marie Pham Age: 78 y.o. Sex: female Date of Birth: 23-Mar-1946 MRN: [id number]. Admitting Physician: Subrina Sundil, MD ERE:Ejuzo, Lauraine, MD  Admit Date: 04/06/2024 Discharge date: 04/08/2024  Recommendations for Outpatient Follow-up:  Follow up with PCP in 1-2 weeks Please obtain CMP/CBC in one week Please ensure follow-up with neurology  Admitted From:  SNF  Disposition: Skilled nursing facility   Discharge Condition: fair  CODE STATUS:   Code Status: Limited: Do not attempt resuscitation (DNR) -DNR-LIMITED -Do Not Intubate/DNI    Diet recommendation:  Diet Order             Diet NPO time specified  Diet effective now                    Brief Summary: Patient is a 78 y.o.  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   Significant events: 12/11>> admit to [facility]   Significant studies: 12/11>> CXR: No active disease 12/11>> CT head: No evidence of acute intracranial process-stable appearance of bilateral anterior cerebral artery territory infarcts.   Significant microbiology data: None   Procedures: None   Consults: Neurology  Brief Hospital Course: New onset seizures Provoked by recent CVA/history of CVA EEG negative Stabilized with Keppra -plan is to continue Keppra  on discharge Standard seizure precautions. 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   Nutrition Status: Nutrition Problem: Inadequate oral intake Etiology:  inability to eat, dysphagia Signs/Symptoms: NPO status Interventions: Tube feeding   Pressure Ulcer: Agree with assessment as outlined below Wound 03/23/24 2100 Pressure Injury Buttocks Right;Left Stage 3 -  Full thickness tissue loss. Subcutaneous fat may be visible but bone, tendon or muscle are NOT exposed. (Active)     Wound 04/07/24 1115 Pressure Injury Buttocks Right;Left;Bilateral Stage 3 -  Full thickness tissue loss. Subcutaneous fat may be visible but bone, tendon or muscle are NOT exposed. (Active)   Nutrition Status: Nutrition Problem: Inadequate oral intake Etiology: inability to eat, dysphagia Signs/Symptoms: NPO status Interventions: Tube feeding   Class 1 Obesity: Estimated body mass index is 31.28 kg/m as calculated from the following:   Height as of this encounter: 5' 5 (1.651 m).   Weight as of this encounter: 85.3 kg.   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  Get Medicines reviewed and adjusted: Please take all your medications with you for your next visit with your Primary MD  Laboratory/radiological data: Please request your Primary MD to go over all  hospital tests and procedure/radiological results at the follow up, please ask your Primary MD to get all Hospital records sent to his/her office.  In some cases, they will be blood work, cultures and biopsy results pending at the time of your discharge. Please request that your primary care M.D. follows up on these results.  Also Note the following: If you experience worsening of your admission symptoms, develop shortness of breath, life threatening emergency, suicidal or homicidal thoughts you must seek medical attention immediately by calling 911 or calling your MD immediately  if symptoms less severe.  You must read complete instructions/literature along with all the possible adverse reactions/side effects for all the Medicines you take and that have been prescribed to you. Take any new Medicines after you have completely understood and accpet all the possible adverse reactions/side effects.   Do not drive when taking Pain medications or sleeping medications (Benzodaizepines)  Do not take more than prescribed Pain, Sleep and Anxiety Medications. It is not advisable to combine anxiety,sleep and pain medications without talking with your primary care practitioner  Special Instructions: If you have smoked or chewed Tobacco  in the last 2 yrs please stop smoking, stop any regular Alcohol  and or any Recreational drug use.  Wear Seat belts while driving.  Please note: You were cared for by a hospitalist during your hospital stay. Once you are discharged, your primary care physician will handle any further medical issues. Please note that NO REFILLS for any discharge medications will be authorized once you are discharged, as it is imperative that you return to your primary care physician (or establish a relationship with a primary care physician if you do not have one) for your post hospital discharge needs so that they can reassess your need for medications and monitor your lab values.     Seizure  precautions: Per [place]  DMV statutes, patients with seizures are not allowed to drive until they have been seizure-free for six months and cleared by a physician    Use caution when using heavy equipment or power tools. Avoid working on ladders or at heights. Take showers instead of baths. Ensure the water  temperature is not too high on the home water  heater. Do not go swimming alone. Do not lock yourself in a room alone (i.e. bathroom). When caring for infants or small children, sit down when holding, feeding, or changing them to minimize risk of injury to the child in the event you have a seizure. Maintain good sleep hygiene. Avoid alcohol.    If patient has another seizure, call 911 and bring them back to the ED if: A.  The seizure lasts longer than 5 minutes.      B.  The patient doesn't wake shortly after the seizure or has new problems such as difficulty seeing, speaking or moving following the seizure C.  The patient was injured during the seizure D.  The patient has a temperature over 102 F (39C) E.  The patient vomited during the seizure and now is having trouble breathing    During the Seizure   - First, ensure adequate ventilation and place patients on the floor on their left side  Loosen clothing around the neck and ensure the airway is patent. If the patient is clenching the teeth, do  not force the mouth open with any object as this can cause severe damage - Remove all items from the surrounding that can be hazardous. The patient may be oblivious to what's happening and may not even know what he or she is doing. If the patient is confused and wandering, either gently guide him/her away and block access to outside areas - Reassure the individual and be comforting - Call 911. In most cases, the seizure ends before EMS arrives. However, there are cases when seizures may last over 3 to 5 minutes. Or the individual may have developed breathing difficulties or severe injuries. If a  pregnant patient or a person with diabetes develops a seizure, it is prudent to call an ambulance. - Finally, if the patient does not regain full consciousness, then call EMS. Most patients will remain confused for about 45 to 90 minutes after a seizure, so you must use judgment in calling for help. - Avoid restraints but make sure the patient is in a bed with padded side rails - Place the individual in a lateral position with the neck slightly flexed; this will help the saliva drain from the mouth and prevent the tongue from falling backward - Remove all nearby furniture and other hazards from the area - Provide verbal assurance as the individual is regaining consciousness - Provide the patient with privacy if possible - Call for help and start treatment as ordered by the caregiver    After the Seizure (Postictal Stage)   After a seizure, most patients experience confusion, fatigue, muscle pain and/or a headache. Thus, one should permit the individual to sleep. For the next few days, reassurance is essential. Being calm and helping reorient the person is also of importance.   Most seizures are painless and end spontaneously. Seizures are not harmful to others but can lead to complications such as stress on the lungs, brain and the heart. Individuals with prior lung problems may develop labored breathing and respiratory distress.    Discharge wound care:   Complete by: As directed    Wound care  Every shift      Comments: 1. Buttocks: Cleanse with Vashe (lawson # [id number]) allow to air dry.  Apply 1/4 thick layer of leptospermum honey to wound bed, top with dry gauze and cover with silicone foam, change daily. Ok to lift silicone foam to reapply Medihoney daily.  2.   Cleanse beneath Bilateral breasts with warm water  and soap, dry thoroughly.  Apply Order Gerlean # [id number] 3.Measure and cut length of InterDry to fit in skin folds that have skin breakdown 4.Tuck InterDry fabric into skin folds in a  single layer, allow for 2 inches of overhang from skin edges to allow for wicking to occur 5.May remove to bathe; dry area thoroughly and then tuck into affected areas again 6.Do not apply any creams or ointments when using InterDry DO NOT THROW AWAY FOR 5 DAYS unless soiled with stool 7. DO NOT [facility] product, this will inactivate the silver in the material  8.New sheet of Interdry should be applied after 5 days of use if patient continues to have skin breakdown   Increase activity slowly   Complete by: As directed       Allergies as of 04/08/2024       Reactions   Lansoprazole Nausea And Vomiting   Penicillins Hives   Prozac [fluoxetine] Hives   Bupropion Rash        Medication List     TAKE these  medications    acetaminophen  500 MG tablet Commonly known as: TYLENOL  Place 1,000 mg into feeding tube in the morning and at bedtime.   ascorbic acid  500 MG tablet Commonly known as: VITAMIN C  Place 1 tablet (500 mg total) into feeding tube 2 (two) times daily.   aspirin  81 MG chewable tablet Place 1 tablet (81 mg total) into feeding tube daily.   atorvastatin  80 MG tablet Commonly known as: LIPITOR  Place 1 tablet (80 mg total) into feeding tube daily.   Biofreeze Cool The Pain XL 5 % Ptch Generic drug: Menthol (Topical Analgesic) Apply 1 patch topically daily. Apply to back every morning and remove every night.   CHEWABLES MULTIVITAMIN PO Place 1 tablet into feeding tube in the morning.   clopidogrel  75 MG tablet Commonly known as: PLAVIX  Place 1 tablet (75 mg total) into feeding tube daily.   collagenase  250 UNIT/GM ointment Commonly known as: SANTYL  Apply topically daily. What changed:  how much to take when to take this   Combigan 0.2-0.5 % ophthalmic solution Generic drug: brimonidine-timolol Apply 1 drop to eye every 12 (twelve) hours.   famotidine  40 MG tablet Commonly known as: PEPCID  Take 1 tablet (40 mg total) by mouth every evening. What changed:   how to take this when to take this   feeding supplement (OSMOLITE 1.2 CAL) Liqd Place 1,000 mLs into feeding tube continuous. What changed:  how much to take additional instructions   nutrition supplement (JUVEN) Pack Place 1 packet into feeding tube 2 (two) times daily between meals. What changed: Another medication with the same name was changed. Make sure you understand how and when to take each.   feeding supplement (PROSource TF20) liquid Place 60 mLs into feeding tube daily.   free water  Soln Place 300 mLs into feeding tube every 4 (four) hours.   ipratropium-albuterol  0.5-2.5 (3) MG/3ML Soln Commonly known as: DUONEB Take 3 mLs by nebulization every 8 (eight) hours as needed.   latanoprost  0.005 % ophthalmic solution Commonly known as: XALATAN  Place 1 drop into both eyes at bedtime.   leptospermum manuka honey Pste paste Apply 1 Application topically daily. Apply thin layer (3 mm) to wound. Start taking on: April 09, 2024   levETIRAcetam  100 MG/ML solution Commonly known as: Keppra  Place 5 mLs (500 mg total) into feeding tube 2 (two) times daily.   multivitamin with minerals Tabs tablet Place 1 tablet into feeding tube daily.   Muscle Rub 10-15 % Crea Apply 1 Application topically as needed. What changed:  when to take this reasons to take this   OXYGEN Inhale 2 L into the lungs as needed (Low O2 sat).   propranolol  20 MG tablet Commonly known as: INDERAL  Place 1 tablet (20 mg total) into feeding tube 3 (three) times daily.   Vitamin D3 1.25 MG (50000 UT) Caps Take 1 capsule by mouth once a week.   zinc  sulfate (50mg  elemental zinc ) 220 (50 Zn) MG capsule Place 1 capsule (220 mg total) into feeding tube daily.               Discharge Care Instructions  (From admission, onward)           Start     Ordered   04/08/24 0000  Discharge wound care:       Comments: Wound care  Every shift      Comments: 1. Buttocks: Cleanse with Vashe  (lawson # [id number]) allow to air dry.  Apply 1/4 thick layer of  leptospermum honey to wound bed, top with dry gauze and cover with silicone foam, change daily. Ok to lift silicone foam to reapply Medihoney daily.  2.   Cleanse beneath Bilateral breasts with warm water  and soap, dry thoroughly.  Apply Order Gerlean # [phone] 3.Measure and cut length of InterDry to fit in skin folds that have skin breakdown 4.Tuck InterDry fabric into skin folds in a single layer, allow for 2 inches of overhang from skin edges to allow for wicking to occur 5.May remove to bathe; dry area thoroughly and then tuck into affected areas again 6.Do not apply any creams or ointments when using InterDry DO NOT THROW AWAY FOR 5 DAYS unless soiled with stool 7. DO NOT [facility] product, this will inactivate the silver in the material  8.New sheet of Interdry should be applied after 5 days of use if patient continues to have skin breakdown   04/08/24 1032            Follow-up Information     Tobie Domino, MD. Schedule an appointment as soon as possible for a visit in 1 week(s).   Specialty: Family Medicine Contact information: [address] [place] [address] [phone]         [facility] Follow up.   Specialty: Neurology Why: Office will call with date/time, If you dont hear from them,please give them a call Contact information: [address] [place]  [address] [phone]               Allergies[1]   Other Procedures/Studies: EEG adult Result Date: 04/07/2024 Shelton Arlin KIDD, MD     04/07/2024 10:44 AM Patient Name: Marie Pham MRN: [id number] Epilepsy Attending: Arlin KIDD Shelton Referring Physician/Provider: Merrianne Locus, MD Date: 04/07/2024 Duration: 23.10 mins Patient history: 78 y.o. female with a PMHx of recent bilateral ACA strokes (left worse than right) who was brought in to the ED today for new onset of seizure  activity. EEG to evaluate for seizure Level of alertness: Awake/ lethargic AEDs during EEG study: LEV Technical aspects: This EEG study was done with scalp electrodes positioned according to the 10-20 International system of electrode placement. Electrical activity was reviewed with band pass filter of 1-70Hz , sensitivity of 7 uV/mm, display speed of 43mm/sec with a 60Hz  notched filter applied as appropriate. EEG data were recorded continuously and digitally stored.  Video monitoring was available and reviewed as appropriate. Description: EEG showed continuous generalized predominantly 5 to 7hz  theta slowing admixed with intermittent 2-3hz  delta slowing. Hyperventilation and photic stimulation were not performed.   ABNORMALITY - Continuous slow, generalized IMPRESSION: This study is suggestive of generalized cerebral dysfunction (encephalopathy). No seizures or epileptiform discharges were seen throughout the recording. Arlin KIDD Shelton   CT HEAD WO CONTRAST Result Date: 04/06/2024 CLINICAL DATA:  Seizure EXAM: CT HEAD WITHOUT CONTRAST TECHNIQUE: Contiguous axial images were obtained from the base of the skull through the vertex without intravenous contrast. RADIATION DOSE REDUCTION: This exam was performed according to the departmental dose-optimization program which includes automated exposure control, adjustment of the mA and/or kV according to patient size and/or use of iterative reconstruction technique. COMPARISON:  03/30/2024 FINDINGS: Brain: Grossly stable appearance of the bilateral frontal lobe infarcts noted on prior study. No evidence of hemorrhagic transformation. No new areas of infarct. Lateral ventricles and midline structures are otherwise unremarkable. No acute extra-axial fluid collections. No mass effect. Vascular: No hyperdense vessel or unexpected calcification. Skull: Normal. Negative for  fracture or focal lesion. Sinuses/Orbits: No acute finding. Other: None. IMPRESSION: 1. Stable  appearance of the bilateral anterior cerebral artery territory infarcts. No evidence of hemorrhagic transformation. 2. No acute intracranial process. Electronically Signed   By: Ozell Daring M.D.   On: 04/06/2024 17:20   DG Chest Port 1 View Result Date: 04/06/2024 CLINICAL DATA:  Altered mental status. EXAM: PORTABLE CHEST 1 VIEW COMPARISON:  Chest radiograph dated 03/29/2024. FINDINGS: No focal consolidation, pleural effusion or pneumothorax. Stable cardiac silhouette. No acute osseous pathology. IMPRESSION: No active disease. Electronically Signed   By: Vanetta Chou M.D.   On: 04/06/2024 17:15   CT CHEST ABDOMEN PELVIS W CONTRAST Result Date: 03/31/2024 EXAM: CT CHEST, ABDOMEN AND PELVIS WITH CONTRAST 03/31/2024 12:06:20 PM TECHNIQUE: CT of the chest, abdomen and pelvis was performed with the administration of 100 mL of iohexol  (OMNIPAQUE ) 300 MG/ML solution. Multiplanar reformatted images are provided for review. Automated exposure control, iterative reconstruction, and/or weight based adjustment of the mA/kV was utilized to reduce the radiation dose to as low as reasonably achievable. COMPARISON: CT of the abdomen and pelvis 03/17/2024. CLINICAL HISTORY: Persistent fevers, looking for occult source of infection. FINDINGS: CHEST: MEDIASTINUM AND LYMPH NODES: Heart and pericardium are unremarkable. Coronary artery calcifications are present. Atherosclerotic calcifications are present at the aortic arch and great vessel origins without focal stenosis or aneurysm. The central airways are clear. No mediastinal, hilar or axillary lymphadenopathy. LUNGS AND PLEURA: The 3 mm pulmonary nodule is present on image 52 of series 4. A 4 mm nodule is noted along the major fissure in the left upper lobe on image 50 of series 4. A 5 mm nodule is present in the right middle lobe on image 61 of series 4. A 4 mm nodule is again noted along the diaphragm in the right middle lobe measuring 4 mm on image 73 of series 4.  A 4 mm nodule is present in the left upper lobe on image 66 of series 4. Mild dependent airspace disease is present in both lungs, increased from the prior study. No pleural effusion or pneumothorax. ABDOMEN AND PELVIS: LIVER: Mild fatty infiltration of the liver is noted. GALLBLADDER AND BILE DUCTS: Gallbladder is unremarkable. No biliary ductal dilatation. SPLEEN: An 8.3 cm cystic lesion in the spleen is stable. PANCREAS: No acute abnormality. ADRENAL GLANDS: No acute abnormality. KIDNEYS, URETERS AND BLADDER: Simple cysts in both kidneys are stable measuring up to 3.5 cm at the lower pole of the left kidney and 3.0 cm posteriorly in the right kidney. Per consensus, no follow-up is needed for simple Bosniak type 1 and 2 renal cysts, unless the patient has a malignancy history or risk factors. No stones in the kidneys or ureters. No hydronephrosis. No perinephric or periureteral stranding. Urinary bladder is unremarkable. GI AND BOWEL: Stomach demonstrates no acute abnormality. Peg tube is in place without complication. Diverticular changes are present in the sigmoid colon without inflammatory change to suggest diverticulitis. A balloon catheter is present in the rectum. There is no bowel obstruction. REPRODUCTIVE ORGANS: Uterus is again noted. PERITONEUM AND RETROPERITONEUM: No ascites. No free air. New presacral edema is present. VASCULATURE: Extensive atherosclerotic changes are present in the abdominal aorta and branch vessels without aneurysm. ABDOMINAL AND PELVIS LYMPH NODES: No lymphadenopathy. BONES AND SOFT TISSUES: No acute osseous abnormality. New edematous changes are present in the dependent subcutaneous tissues of the lower abdomen and pelvis, right greater than left. No discrete abscess is present. IMPRESSION: 1. No acute findings. 2.  Mild dependent airspace disease in both lungs, increased from the prior study. This likely represents atelectasis. 3. Multiple solid pulmonary nodules up to 5 mm  without high-risk features; no routine imaging follow-up is recommended per Fleischner Society guidelines. 4. New edematous changes in the dependent subcutaneous tissues of the lower abdomen and pelvis, right greater than left; no discrete abscess. Findings suggest early anasarca. No discrete abscess is present. Electronically signed by: Lonni Necessary MD 03/31/2024 04:38 PM EST RP Workstation: [id number]   US  Venous Img Lower Bilateral (DVT) Result Date: 03/30/2024 CLINICAL DATA:  10027 Tachypnea [id number] Fever [id number] EXAM: BILATERAL LOWER EXTREMITY VENOUS DOPPLER ULTRASOUND TECHNIQUE: Gray-scale sonography with graded compression, as well as color Doppler and duplex ultrasound were performed to evaluate the lower extremity deep venous systems from the level of the common femoral vein and including the common femoral, femoral, profunda femoral, popliteal and calf veins including the posterior tibial, peroneal and gastrocnemius veins when visible. The superficial great saphenous vein was also interrogated. Spectral Doppler was utilized to evaluate flow at rest and with distal augmentation maneuvers in the common femoral, femoral and popliteal veins. COMPARISON:  None Available. FINDINGS: RIGHT LOWER EXTREMITY Common Femoral Vein: No evidence of thrombus. Normal compressibility, respiratory phasicity and response to augmentation. Saphenofemoral Junction: No evidence of thrombus. Normal compressibility and flow on color Doppler imaging. Profunda Femoral Vein: No evidence of thrombus. Normal compressibility and flow on color Doppler imaging. Femoral Vein: No evidence of thrombus. Normal compressibility, respiratory phasicity and response to augmentation. Popliteal Vein: No evidence of thrombus. Normal compressibility, respiratory phasicity and response to augmentation. Calf Veins: No evidence of thrombus. Normal compressibility and flow on color Doppler imaging. Superficial Great Saphenous Vein: No evidence  of thrombus. Normal compressibility. Other Findings:  None. LEFT LOWER EXTREMITY Common Femoral Vein: No evidence of thrombus. Normal compressibility, respiratory phasicity and response to augmentation. Saphenofemoral Junction: No evidence of thrombus. Normal compressibility and flow on color Doppler imaging. Profunda Femoral Vein: No evidence of thrombus. Normal compressibility and flow on color Doppler imaging. Femoral Vein: No evidence of thrombus. Normal compressibility, respiratory phasicity and response to augmentation. Popliteal Vein: No evidence of thrombus. Normal compressibility, respiratory phasicity and response to augmentation. Calf Veins: No evidence of thrombus. Normal compressibility and flow on color Doppler imaging. Superficial Great Saphenous Vein: No evidence of thrombus. Normal compressibility. Other Findings:  None. IMPRESSION: Negative for deep venous thrombosis within both legs. Electronically Signed   By: Rogelia Myers M.D.   On: 03/30/2024 22:21   CT HEAD WO CONTRAST ( ) Result Date: 03/30/2024 EXAM: CT HEAD WITHOUT CONTRAST 03/30/2024 05:17:31 PM TECHNIQUE: CT of the head was performed without the administration of intravenous contrast. Automated exposure control, iterative reconstruction, and/or weight based adjustment of the mA/kV was utilized to reduce the radiation dose to as low as reasonably achievable. COMPARISON: 03/17/2024. CLINICAL HISTORY: Rule out increased intracranial pressure. FINDINGS: BRAIN AND VENTRICLES: No acute hemorrhage. 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 Chronic small vessel ischemic changes of the white matter. Chronic lacunar infarct right thalamus. No hydrocephalus. No extra-axial collection. No mass effect or midline shift. ORBITS: No acute abnormality. SINUSES: No acute abnormality. SOFT TISSUES AND SKULL: No acute soft tissue abnormality. No skull fracture. Carotid vascular calcification. IMPRESSION: 1. Evolving bilateral  anterior cerebral artery territory infarcts. No hemorrhagic transformation or mass effect. 2. Chronic small vessel ischemic changes of the white matter and chronic lacunar infarct right thalamus. Electronically signed by: Franky Stanford MD 03/30/2024 07:57 PM EST RP Workstation:  [id number]   IR GASTROSTOMY TUBE MOD SED Result Date: 03/29/2024 INDICATION: Dysphagia.  Altered mental status EXAM: PERCUTANEOUS GASTROSTOMY TUBE PLACEMENT COMPARISON:  Chest XR, earlier same day.  CT AP, 03/17/2024. MEDICATIONS: The patient was on scheduled IV antibiotics Glucagon  0.5 mg IV CONTRAST:  15 mL of Isovue  300 administered into the gastric lumen. ANESTHESIA/SEDATION: Local anesthetic and single agent sedation was employed during this procedure. A total of fentanyl  25 mcg was administered intravenously. The patient's level of consciousness and vital signs were monitored continuously by radiology nursing throughout the procedure under my direct supervision. FLUOROSCOPY: Radiation Exposure Index and estimated peak skin dose (PSD); Reference air kerma (RAK), 7 mGy. COMPLICATIONS: None immediate. PROCEDURE: Informed written consent was obtained from the patient and/or patient's representative following explanation of the procedure, risks, benefits and alternatives. A time out was performed prior to the initiation of the procedure. Maximal barrier sterile technique utilized including caps, mask, sterile gowns, sterile gloves, large sterile drape, hand hygiene and sterile prep. The LEFT costal margin and barium opacified transverse colon were identified and avoided. Air was injected into the stomach for insufflation and visualization under fluoroscopy. Under sterile conditions and local anesthesia, 2 T tacks were utilized to pexy the anterior aspect of the stomach against the ventral abdominal wall. Contrast injection confirmed appropriate positioning of each of the T tacks. An incision was made between the T tacks and a 17 gauge  trocar needle was utilized to access the stomach. Needle position was confirmed within the stomach with aspiration of air and injection of a small amount of contrast. A stiff guidewire was advanced into the gastric lumen and under intermittent fluoroscopic guidance, the access needle was exchanged for a telescoping peel-away sheath, ultimately allowing placement of a 18 Fr balloon retention gastrostomy tube. The retention balloon was insufflated with a mixture of dilute saline and contrast and pulled taut against the anterior wall of the stomach. The external disc was cinched. Contrast injection confirms positioning within the stomach. Several spot radiographic images were obtained in various obliquities for documentation. The patient tolerated procedure well without immediate post procedural complication. FINDINGS: After successful fluoroscopic guided placement, the gastrostomy tube is appropriately positioned with internal retention balloon against the ventral aspect of the gastric lumen. IMPRESSION: Successful fluoroscopic insertion of a 18 Fr balloon retention gastrostomy tube. The gastrostomy may be used immediately for medication administration and in 4 hrs for the initiation of feeds. RECOMMENDATIONS: The patient will return to [facility] ([facility]) for routine feeding tube evaluation and exchange in 6 months. Thom Hall, MD [facility] Electronically Signed   By: Thom Hall M.D.   On: 03/29/2024 16:05   DG Chest 1 View Result Date: 03/29/2024 EXAM: 1 VIEW(S) XRAY OF THE CHEST 03/29/2024 01:37:12 AM COMPARISON: 03/27/2024 CLINICAL HISTORY: Dyspnea FINDINGS: LINES, TUBES AND DEVICES: Enteric tube in place, coursing through chest to abdomen beyond field-of-view. LUNGS AND PLEURA: Low lung volumes with bronchovascular crowding. No pleural effusion. No pneumothorax. No focal consolidation. HEART AND MEDIASTINUM: Aortic  atherosclerosis. BONES AND SOFT TISSUES: No acute osseous abnormality. IMPRESSION: 1. Pulmonary hypoinflation. Electronically signed by: Dorethia Molt MD 03/29/2024 01:43 AM EST RP Workstation: [id number]   DG Abd 1 View Result Date: 03/27/2024 EXAM: 1 VIEW XRAY OF THE ABDOMEN 03/27/2024 01:50:00 PM COMPARISON: 03/23/2024, CT 03/17/2024. CLINICAL HISTORY: Fever. FINDINGS: BOWEL: Nonobstructive bowel gas pattern. SOFT TISSUES: Large caliber catheter projects over the lower pelvis inferior to the symphysis pubis joint. Atherosclerotic calcifications present. There  is a well defined 6 cm oval density projecting over the right side of the pelvis with associated more dense rectangular structures centrally superimposed on this density of uncertain clinical significance. No opaque urinary calculi. 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 Visualized left hip arthroplasty unchanged. No acute osseous abnormality. IMPRESSION: 1. No acute abnormality identified. 2. Large-bore pelvic catheter projects over the lower pelvis of uncertain clinical significance . Radiodensity over the right pelvis likely foreign body and of uncertain clinical significance. Electronically signed by: Toribio Agreste MD 03/27/2024 05:49 PM EST RP Workstation: [id number]   DG Chest Port 1 View Result Date: 03/27/2024 EXAM: 1 VIEW(S) XRAY OF THE CHEST 03/27/2024 01:50:00 PM COMPARISON: 03/21/2024 CLINICAL HISTORY: Fever FINDINGS: LINES, TUBES AND DEVICES: Enteric tube in place with tip in proximal stomach. LUNGS AND PLEURA: Lungs are hypoinflated. Linear density of the left base/retrocardiac region compatible with atelectasis. Possible small left pleural effusion. No pneumothorax. No other focal pulmonary opacity. HEART AND MEDIASTINUM: Atherosclerotic calcifications of aortic arch. No acute abnormality of the cardiac and mediastinal silhouettes. BONES AND SOFT TISSUES: No acute osseous abnormality. IMPRESSION: 1. Hypoinflation with  linear density at the left base/retrocardiac region compatible with atelectasis. 2. Enter tube unchanged. Electronically signed by: Toribio Agreste MD 03/27/2024 05:42 PM EST RP Workstation: [id number]   DG Naso G Tube Plc W/Fl W/Rad Result Date: 03/27/2024 INDICATION: Protein calorie malnutrition s/p Dobbhoff placement on 03/20/2024 that was unintentionally removed earlier today. EXAM: NASO G TUBE PLACEMENT WITH FL AND WITH RAD FLUOROSCOPY TIME:  Radiation Exposure Index (as provided by the fluoroscopic device): 8.3 mGy Kerma COMPLICATIONS: None immediate PROCEDURE: The Dobbhoff tube was lubricated with viscous lidocaine  inserted into the right nostril. Under intermittent fluoroscopic guidance, the Dobbhoff tube was advanced through the stomach, through the duodenum with tip ultimately terminating in the descending duodenum. A spot fluoroscopic image was saved for documentation purposes. The tube was affixed to the patient's nose with tape. The patient tolerated the procedure well without immediate postprocedural complication. IMPRESSION: Successful fluoroscopic guided placement of Dobbhoff tube with tip terminating in the descending duodenum. The tube is ready for immediate use. This exam was performed by [facility] PA-C, and was supervised and interpreted by Dr. VEAR Lent. Electronically Signed   By: Wilkie Lent M.D.   On: 03/27/2024 16:27   DG Abd 1 View Result Date: 03/23/2024 EXAM: 1 VIEW XRAY OF THE ABDOMEN 03/23/2024 05:54:51 PM COMPARISON: Comparison with 03/20/2024. CLINICAL HISTORY: [id number] Encounter for care related to feeding tube [id number] Encounter for care related to feeding tube FINDINGS: LIMITATIONS/ARTIFACTS: Limited field of view for tube placement verification purposes. LINES, TUBES AND DEVICES: An enteric tube is present with the tip projecting over the upper mid-abdomen, consistent with a location in the body of the stomach. BOWEL: Nonobstructive bowel gas pattern. SOFT TISSUES:  No opaque urinary calculi. BONES: No acute osseous abnormality. IMPRESSION: 1. Enteric tube tip projects over the upper mid-abdomen, consistent with a location in the body of the stomach. Electronically signed by: Elsie Gravely MD 03/23/2024 06:19 PM EST RP Workstation: [id number]   MR BRAIN WO CONTRAST Result Date: 03/23/2024 EXAM: MRI BRAIN WITHOUT CONTRAST 03/23/2024 04:04:32 PM TECHNIQUE: Multiplanar multisequence MRI of the head/brain was performed without the administration of intravenous contrast. COMPARISON: Brain MRI 03/18/2024, and earlier exams. CLINICAL HISTORY: 78 year old female with recent bilateral ACA territory infarcts, left greater than right. FINDINGS: BRAIN AND VENTRICLES: Progressed bilaterally ACA territory infarcts and cytotoxic edema, left greater than right. No hemorrhagic transformation. No new areas  of acute infarction. New intrinsic T1 hyperintensity within the left caudate, possibly petechial blood. No other acute intracranial hemorrhage. No intracranial mass effect or midline shift. No hydrocephalus. The sella is unremarkable. Visible major vascular flow voids remain stable, including probable maintained patency at the ACA origins. Gyral enlargement more pronounced in the left ACA territory, and confluent involvement of the left half of the corpus callosum as seen on series 17 image 19. Elnor and white matter signal elsewhere is stable including bilateral confluent but nonspecific periventricular white matter T2 and FLAIR hyperintensity. The left caudate nucleus which appeared edematous and enlarged previously now appears virtually normal (series 8 images 14 and 15). However, there is new intrinsic T1 hyperintensity within the left caudate as seen on series 14 image 84. This might be petechial blood. No malignant hemorrhagic transformation or mass effect. Chronic lacunar infarct at the ventral right thalamus again noted. No convincing involvement of other vascular territories.  Chronic small vessel disease. ORBITS: Orbit detail limited today by susceptibility artifact. SINUSES AND MASTOIDS: Paranasal sinus detail limited today by susceptibility artifact. Mild increased bilateral mastoid air cell effusions. BONES AND SOFT TISSUES: Metal susceptibility artifact associated with the face, ethmoid sinuses. Negative visible cervical spine. IMPRESSION: 1. Progressed bilateral ACA territory infarcts with cytotoxic edema, left greater than right. No hemorrhagic transformation. No significant mass effect. 2. Evolution of left caudate infarct with possible petechial hemorrhage, but no malignant hemorrhagic transformation or mass effect. 3. No new vascular territory ischemia. Chronic small vessel disease. Electronically signed by: Helayne Hurst MD 03/23/2024 05:35 PM EST RP Workstation: [id number]   EEG adult Result Date: 03/22/2024 Michaela Aisha SQUIBB, MD     03/23/2024 10:07 AM History: 78 yo F with CVA and ams, EEG to evaluated for seizures EEG Duration: 30 minutes Sedation: none Patient State: Awake and drowsy Technique: This EEG was acquired with electrodes placed according to the International 10-20 electrode system (including Fp1, Fp2, F3, F4, C3, C4, P3, P4, O1, O2, T3, T4, T5, T6, A1, A2, Fz, Cz, Pz). The following electrodes were missing or displaced: none. Background: The background somewhat disorganized, with pronounced irregular theta and delta range activities.  There is a posterior dominant rhythm of 8 to 8.5 Hz that is well-sustained.  There are occasional bifrontally predominant discharges with a shifting lateral predominance with triphasic morphology. Photic stimulation: Physiologic driving is not performed EEG Abnormalities: 1) triphasic waves 2) generalized irregular slow activity Clinical Interpretation: This EEG is consistent with a generalized nonspecific cerebral dysfunction, though triphasic waves are nonspecific and they can be associated with toxic/metabolic etiologies  of encephalopathy. There was no seizure or seizure predisposition recorded on this study. Please note that lack of epileptiform activity on EEG does not preclude the possibility of epilepsy. Aisha Michaela, MD [facility] If 7pm- 7am, please page neurology on call as listed in AMION.  DG Chest Port 1 View Result Date: 03/21/2024 CLINICAL DATA:  Pneumonia EXAM: PORTABLE CHEST 1 VIEW COMPARISON:  Chest radiograph dated 03/18/2024 FINDINGS: Lines/tubes: Gastric/enteric tube tip projects over the stomach. Lungs: Low lung volumes with bronchovascular crowding. No focal consolidation. Pleura: No pneumothorax or pleural effusion. Heart/mediastinum: The heart size and mediastinal contours are within normal limits. Bones: No acute osseous abnormality. IMPRESSION: Low lung volumes with bronchovascular crowding. No focal consolidation. Electronically Signed   By: Limin  Xu M.D.   On: 03/21/2024 08:50   DG [place] MATSU Tube Plc W/Fl W/Rad Result Date: 03/20/2024 INDICATION: Protein-calorie malnutrition. EXAM: NASO G TUBE PLACEMENT WITH FL AND  WITH RAD COMPARISON:  None Available. CONTRAST:  None. FLUOROSCOPY TIME:  Radiation Exposure Index (as provided by the fluoroscopic device): 13.4 mGy Kerma COMPLICATIONS: None. PROCEDURE: The Dobbhoff tube was lubricated with viscous lidocaine  inserted into the right nostril. Under intermittent fluoroscopic guidance, the Dobbhoff tube was advanced through the stomach. However, despite many attempts, the tube was not amenable to post-pyloric placement. A spot fluoroscopic image was saved for documentation purposes. The tube was affixed to the patient's nose with tape. The patient tolerated the procedure well without immediate postprocedural complication. FINDINGS: Successful fluoroscopic guided placement of Dobbhoff tube with tip within the gastric lumen. IMPRESSION: Successful fluoroscopic guided placement of Dobbhoff tube with tip within the gastric lumen. The tube is  ready for immediate use. Performed by: Carlin Griffon, PA-C Supervised and interpreted by: Newell Eke, MD Electronically Signed   By: Newell Eke M.D.   On: 03/20/2024 10:06   CT Angio Chest Pulmonary Embolism (PE) W or WO Contrast Result Date: 03/18/2024 CLINICAL DATA:  Pulmonary embolism suspected, high probability. Shortness of breath. EXAM: CT ANGIOGRAPHY CHEST WITH CONTRAST TECHNIQUE: Multidetector CT imaging of the chest was performed using the standard protocol during bolus administration of intravenous contrast. Multiplanar CT image reconstructions and MIPs were obtained to evaluate the vascular anatomy. RADIATION DOSE REDUCTION: This exam was performed according to the departmental dose-optimization program which includes automated exposure control, adjustment of the mA and/or kV according to patient size and/or use of iterative reconstruction technique. CONTRAST:  75mL OMNIPAQUE  IOHEXOL  350 MG/ML SOLN COMPARISON:  11/09/2022, 01/12/2016. FINDINGS: Cardiovascular: Heart is mildly enlarged and there is a trace pericardial effusion. Scattered coronary artery calcifications are present. There is atherosclerotic calcification of the aorta without evidence of aneurysm. Pulmonary trunk is normal in caliber. No definite evidence of pulmonary embolism is seen. Examination is limited due to respiratory motion artifact. Mediastinum/Nodes: No mediastinal, hilar, or axillary lymphadenopathy is seen. The thyroid  gland, trachea, and esophagus are within normal limits. Lungs/Pleura: Hazy ground-glass attenuation is present in the lungs bilaterally. Atelectasis is noted bilaterally. No effusion or pneumothorax is seen. Multiple nodules are noted bilaterally, the largest measuring 4 mm in the right middle lobe, axial image 61, and 4 mm in the left upper lobe, axial image 40, unchanged from 2017 and likely benign. Upper Abdomen: Hyperdense material is present within the gallbladder likely representing excreted  contrast from previous examination. Cysts are noted in the right kidney. No acute abnormality. Musculoskeletal: Degenerative changes are present in the thoracic spine. No acute osseous abnormality. Review of the MIP images confirms the above findings. IMPRESSION: 1. No evidence of pulmonary embolism. 2. Ground-glass attenuation in the lungs bilaterally, possible air trapping or pneumonitis. 3. Cardiomegaly with coronary artery calcifications. 4. Aortic atherosclerosis. Electronically Signed   By: Leita Birmingham M.D.   On: 03/18/2024 15:42   ECHOCARDIOGRAM COMPLETE Result Date: 03/18/2024    ECHOCARDIOGRAM REPORT   Patient Name:   LOLETTA HARPER Date of Exam: 03/18/2024 Medical Rec #:  [id number]     Height:       65.0 in Accession #:    [id number]    Weight:       184.1 lb Date of Birth:  03-20-1946    BSA:          1.910 m Patient Age:    77 years      BP:           140/57 mmHg Patient Gender: F  HR:           119 bpm. Exam Location:  [facility] Procedure: 2D Echo, Cardiac Doppler and Color Doppler (Both Spectral and Color            Flow Doppler were utilized during procedure). Indications:     Stroke I63.9  History:         Patient has no prior history of Echocardiogram examinations.                  Risk Factors:Hypertension.  Sonographer:     Bari Roar Referring Phys:  [id number] DELAYNE LULLA SOLIAN Diagnosing Phys: Denyse Bathe  Sonographer Comments: Technically difficult study due to poor echo windows and patient is obese. Image acquisition challenging due to patient body habitus and Image acquisition challenging due to respiratory motion. IMPRESSIONS  1. Left ventricular ejection fraction, by estimation, is 60 to 65%. The left ventricle has normal function. The left ventricle has no regional wall motion abnormalities. Left ventricular diastolic parameters are consistent with Grade III diastolic dysfunction (restrictive).  2. Right ventricular systolic function is normal. The right ventricular size is  normal.  3. Left atrial size was mildly dilated.  4. Right atrial size was mildly dilated.  5. The mitral valve is normal in structure. Trivial mitral valve regurgitation. No evidence of mitral stenosis.  6. The aortic valve is calcified. Aortic valve regurgitation is trivial. Severe aortic valve stenosis.  7. The inferior vena cava is normal in size with greater than 50% respiratory variability, suggesting right atrial pressure of 3 mmHg. FINDINGS  Left Ventricle: Left ventricular ejection fraction, by estimation, is 60 to 65%. The left ventricle has normal function. The left ventricle has no regional wall motion abnormalities. Strain was performed and the global longitudinal strain is indeterminate. The left ventricular internal cavity size was normal in size. There is no left ventricular hypertrophy. Left ventricular diastolic parameters are consistent with Grade III diastolic dysfunction (restrictive). Right Ventricle: The right ventricular size is normal. No increase in right ventricular wall thickness. Right ventricular systolic function is normal. Left Atrium: Left atrial size was mildly dilated. Right Atrium: Right atrial size was mildly dilated. Pericardium: There is no evidence of pericardial effusion. Mitral Valve: The mitral valve is normal in structure. Trivial mitral valve regurgitation. No evidence of mitral valve stenosis. Tricuspid Valve: The tricuspid valve is normal in structure. Tricuspid valve regurgitation is trivial. No evidence of tricuspid stenosis. Aortic Valve: The aortic valve is calcified. Aortic valve regurgitation is trivial. Severe aortic stenosis is present. Aortic valve mean gradient measures 32.5 mmHg. Aortic valve peak gradient measures 57.3 mmHg. Aortic valve area, by VTI measures 0.53 cm. Pulmonic Valve: The pulmonic valve was normal in structure. Pulmonic valve regurgitation is not visualized. No evidence of pulmonic stenosis. Aorta: The aortic root is normal in size and  structure. Venous: The inferior vena cava is normal in size with greater than 50% respiratory variability, suggesting right atrial pressure of 3 mmHg. IAS/Shunts: No atrial level shunt detected by color flow Doppler. Additional Comments: 3D was performed not requiring image post processing on an independent workstation and was indeterminate.  LEFT VENTRICLE PLAX 2D LVIDd:         3.60 cm   Diastology LVIDs:         2.40 cm   LV e' medial:    7.15 cm/s LV PW:         1.20 cm   LV E/e' medial:  24.2 LV IVS:  1.70 cm   LV e' lateral:   7.93 cm/s LVOT diam:     1.70 cm   LV E/e' lateral: 21.8 LV SV:         32 LV SV Index:   17 LVOT Area:     2.27 cm  RIGHT VENTRICLE RV S prime:     10.70 cm/s LEFT ATRIUM           Index LA diam:      3.20 cm 1.68 cm/m LA Vol (A4C): 33.6 ml 17.60 ml/m  AORTIC VALVE                     PULMONIC VALVE AV Area (Vmax):    0.76 cm      PV Vmax:        1.56 m/s AV Area (Vmean):   0.62 cm      PV Peak grad:   9.7 mmHg AV Area (VTI):     0.53 cm      RVOT Peak grad: 6 mmHg AV Vmax:           378.50 cm/s AV Vmean:          258.000 cm/s AV VTI:            0.591 m AV Peak Grad:      57.3 mmHg AV Mean Grad:      32.5 mmHg LVOT Vmax:         127.00 cm/s LVOT Vmean:        70.400 cm/s LVOT VTI:          0.139 m LVOT/AV VTI ratio: 0.24  AORTA Ao Root diam: 2.30 cm Ao Asc diam:  2.80 cm MITRAL VALVE                TRICUSPID VALVE MV Area (PHT): 7.59 cm     TR Peak grad:   18.8 mmHg MV Decel Time: 100 msec     TR Vmax:        217.00 cm/s MV E velocity: 173.00 cm/s MV A Prime:    13.7 cm/s    SHUNTS                             Systemic VTI:  0.14 m                             Systemic Diam: 1.70 cm Denyse Bathe Electronically signed by Denyse Bathe Signature Date/Time: 03/18/2024/2:42:42 PM    Final    MR BRAIN WO CONTRAST Result Date: 03/18/2024 EXAM: MRI BRAIN WITHOUT CONTRAST 03/18/2024 12:53:00 PM TECHNIQUE: Multiplanar multisequence MRI of the head/brain was performed without the  administration of intravenous contrast. COMPARISON: CT head and CTA head and neck 03/17/2024. Brain MRI 07/17/2023. CLINICAL HISTORY: 78 year old female with acute neuro deficit, stroke suspected. FINDINGS: BRAIN: Confluent restricted diffusion throughout much of the left ACA territory (series 5 image 36, series 7 image 28) with T2 and FLAIR hyperintense cytotoxic edema. Similar intense restricted diffusion also in the anterior and inferior caudate nucleus, corresponding to head CT finding yesterday. Caudate is mildly expanded by cytotoxic edema. No hemorrhagic transformation. There is also subtle contralateral proximal right ACA territory infarct with restricted diffusion and cytotoxic edema affecting the right inferior frontal gyrus (series 5 image 24, series 9 image 24). And Faint right superior frontal gyrus white matter subcentimeter restricted diffusion (series  5 image 39) is noted in the right MCA / ACA watershed territory. Chronic periventricular and scattered cerebral white matter T2 and FLAIR hyperintensity is moderate for age and stable outside of the acute findings. Minimal chronic microhemorrhage suspected in the brain such as left occipital lobe series 12 image 27. Chronic lacunar infarct in the ventral right thalamus. No significant intracranial mass effect. No midline shift. No hydrocephalus. The sella is unremarkable. Normal flow voids. Mediovascular phleboliths at the skull base are preserved. No other diffusion restriction. Brainstem and cerebellum are stable. ORBITS: No acute abnormality. SINUSES AND MASTOIDS: Stable mild left mastoid effusion. Negative visible nasopharynx. BONES AND SOFT TISSUES: Hyperostosis of the calvarium, normal variant. Normal marrow signal. No acute soft tissue abnormality. Negative visible cervical spine. IMPRESSION: 1. Confluent Left ACA territory acute infarct, including involvement of the inferomedial left caudate nucleus. Additionally, small area of acute infarct in  the proximal right ACA territory, and several sub centimeter foci of acute ischemia in the distal right MCA/ACA watershed. 2. Cytotoxic edema with no hemorrhagic transformation or significant mass effect. 3. Elsewhere stable chronic small vessel disease. Electronically signed by: Helayne Hurst MD 03/18/2024 01:04 PM EST RP Workstation: [id number]   DG Chest Port 1 View Result Date: 03/18/2024 EXAM: 1 VIEW(S) XRAY OF THE CHEST 03/18/2024 10:29:00 AM COMPARISON: Radiographs from 07/17/2023. CTA head and neck from 03/17/2024. Radiographs from 03/18/2024 (earlier today). CLINICAL HISTORY: 78 year old female with pneumonia. FINDINGS: LUNGS AND PLEURA: Low lung volumes. Stable linear scarring or subsegmental atelectasis at left base. The right suprahilar opacity appears chronic but increased when compared to radiographs from 07/17/2023. Appearance unchanged from earlier today. CTA neck yesterday negative aside from gas trapping. No pleural effusion. No pneumothorax. HEART AND MEDIASTINUM: Aortic atherosclerosis. No acute abnormality of the cardiac and mediastinal silhouettes. BONES AND SOFT TISSUES: No acute osseous abnormality. IMPRESSION: 1. Streaky right suprahilar opacity persists, CTA neck yesterday negative aside from gas trapping. Favor atelectasis rather than developing infection. 2. No new cardiopulmonary abnormality. Electronically signed by: Helayne Hurst MD 03/18/2024 10:37 AM EST RP Workstation: [id number]   DG Chest Port 1 View Result Date: 03/18/2024 EXAM: 1 VIEW(S) XRAY OF THE CHEST 03/18/2024 01:45:55 AM COMPARISON: 07/17/2023 CLINICAL HISTORY: Shortness of breath. FINDINGS: LIMITATIONS/ARTIFACTS: Shallow inspiration. LUNGS AND PLEURA: Linear atelectasis in the left base and right mid lung. No focal consolidation. No pleural effusion. No pneumothorax. HEART AND MEDIASTINUM: Calcification of the aorta. BONES AND SOFT TISSUES: Degenerative changes in the spine and shoulders. IMPRESSION: 1. Linear  atelectasis in the left base and right mid lung, which may contribute to shortness of breath. 2. No acute cardiopulmonary process identified. Electronically signed by: Elsie Gravely MD 03/18/2024 01:52 AM EST RP Workstation: [id number]   CT ANGIO HEAD NECK W WO CM Result Date: 03/17/2024 EXAM: CTA Head and Neck with Intravenous Contrast. CT Head without Contrast. CLINICAL HISTORY: Neuro deficit, acute, stroke suspected Neuro deficit, acute, stroke suspected TECHNIQUE: Axial CTA images of the head and neck performed with intravenous contrast. MIP reconstructed images were created and reviewed. Axial computed tomography images of the head/brain performed without intravenous contrast. Note: Per PQRS, the description of internal carotid artery percent stenosis, including 0 percent or normal exam, is based on [employer] American Symptomatic Carotid Endarterectomy Trial (NASCET) criteria. Dose reduction technique was used including one or more of the following: automated exposure control, adjustment of mA and kV according to patient size, and/or iterative reconstruction. CONTRAST: With; COMPARISON: CT head November 09, 2023 FINDINGS: CT HEAD: BRAIN: Limited CT head  with suggestion of hypodensity in the left basal ganglia (series 2 image 14). No acute intraparenchymal hemorrhage. No mass lesion. No midline shift or extra-axial collection. VENTRICLES: No hydrocephalus. ORBITS: The orbits are unremarkable. SINUSES AND MASTOIDS: The paranasal sinuses and mastoid air cells are clear. CTA NECK: COMMON CAROTID ARTERIES: No significant stenosis. No dissection or occlusion. INTERNAL CAROTID ARTERIES: No stenosis by NASCET criteria. No dissection or occlusion. VERTEBRAL ARTERIES: Moderate stenosis of the left vertebral artery origin. Occlusion versus severe stenosis of the nondominant distal right intradural vertebral artery. Dominant left vertebral artery is patent without significant stenosis. CTA HEAD: ANTERIOR CEREBRAL ARTERIES:  No significant stenosis. No occlusion. No aneurysm. Hypoplastic right A1 ACA. MIDDLE CEREBRAL ARTERIES: Mild to moderate left M1 and proximal M2 MCA stenoses. No occlusion. No aneurysm. POSTERIOR CEREBRAL ARTERIES: Severe bilateral P2 PCA stenoses. No occlusion. No aneurysm. BASILAR ARTERY: No significant stenosis. No occlusion. No aneurysm. OTHER: SOFT TISSUES: No acute finding. No masses or lymphadenopathy. BONES: No acute osseous abnormality. IMPRESSION: 1. Limited CT head with hypodensity in the left basal ganglia that is suspicious for acute/recent infarct. Recommend MRI to assess for acute infarct. 2. 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 3. Severe bilateral P2 PCA stenoses. 4. Moderate left vertebral artery origin stenosis. 5. Mild to moderate left M1 and M2 MCA stenoses. Electronically signed by: Gilmore Molt MD 03/17/2024 09:54 PM EST RP Workstation: [id number]   CT ABDOMEN PELVIS W CONTRAST Result Date: 03/17/2024 EXAM: CT ABDOMEN AND PELVIS WITH CONTRAST 03/17/2024 05:47:17 PM TECHNIQUE: CT of the abdomen and pelvis was performed with the administration of intravenous contrast. 100 mL of iohexol  (OMNIPAQUE ) 300 MG/ML solution was administered. Multiplanar reformatted images are provided for review. Automated exposure control, iterative reconstruction, and/or weight-based adjustment of the mA/kV was utilized to reduce the radiation dose to as low as reasonably achievable. COMPARISON: None available. CLINICAL HISTORY: LLQ abdominal pain. FINDINGS: LOWER CHEST: Treatment artifact in the lung bases. 4 mm nodule in the right costophrenic angle. LIVER: Fatty infiltration of the liver. GALLBLADDER AND BILE DUCTS: Gallbladder is unremarkable. No biliary ductal dilatation. SPLEEN: Circumscribed low attenuation lesion in the spleen measuring 3 cm diameter. This is likely a cyst or hemangioma. PANCREAS: No acute abnormality. ADRENAL GLANDS: No acute abnormality. KIDNEYS,  URETERS AND BLADDER: Bilateral renal cysts. No stones in the kidneys or ureters. No hydronephrosis. No perinephric or periureteral stranding. Urinary bladder is unremarkable. Per consensus, no follow-up is needed for simple Bosniak type 1 and 2 renal cysts, unless the patient has a malignancy history or risk factors. GI AND BOWEL: Stomach demonstrates no acute abnormality. Diverticulosis of the sigmoid colon. No evidence of acute diverticulitis. There is no bowel obstruction. PERITONEUM AND RETROPERITONEUM: No ascites. No free air. VASCULATURE: Calcification of the aorta. No aneurysm. Aorta is normal in caliber. LYMPH NODES: No lymphadenopathy. REPRODUCTIVE ORGANS: No acute abnormality. BONES AND SOFT TISSUES: Previous left hip arthroplasty. Degenerative changes in the spine. Degenerative changes in the right hip. Port. No acute osseous abnormality. No focal soft tissue abnormality. IMPRESSION: 1. No acute findings. 2. 4 mm right costophrenic angle pulmonary nodule; given incomplete chest coverage, per Fleischner Society Guidelines for incidental nodules 5 mm, no routine follow-up is recommended for low-risk or unknown-risk patients without high-risk nodule features; if high-risk for malignancy or if high-risk nodule features are present, an optional non-contrast chest CT at 12 months may be considered; if performed and stable at 12 months, no further follow-up is needed. Electronically signed by: Elsie Gravely MD  03/17/2024 05:58 PM EST RP Workstation: [id number]     TODAY-DAY OF DISCHARGE:  Subjective:   Marie Pham today has no headache,no chest abdominal pain,no new weakness tingling or numbness, feels much better wants to go home today.   Objective:   Blood pressure (!) 146/64, pulse 87, temperature 98.1 F (36.7 C), temperature source Axillary, resp. rate 17, height 5' 5 (1.651 m), weight 85.3 kg, SpO2 97%.  Intake/Output Summary (Last 24 hours) at 04/08/2024 1034 Last data filed at  04/08/2024 0800 Gross per 24 hour  Intake 2715 ml  Output 300 ml  Net 2415 ml   Filed Weights   04/06/24 1544  Weight: 85.3 kg    Exam: Awake Alert, Oriented *3, No new F.N deficits, Normal affect [place].AT,PERRAL Supple Neck,No JVD, No cervical lymphadenopathy appriciated.  Symmetrical Chest wall movement, Good air movement bilaterally, CTAB RRR,No Gallops,Rubs or new Murmurs, No Parasternal Heave +ve B.Sounds, Abd Soft, Non tender, No organomegaly appriciated, No rebound -guarding or rigidity. No Cyanosis, Clubbing or edema, No new Rash or bruise   PERTINENT RADIOLOGIC STUDIES: EEG adult Result Date: 04/07/2024 Shelton Arlin KIDD, MD     04/07/2024 10:44 AM Patient Name: Marie Pham MRN: [id number] Epilepsy Attending: Arlin KIDD Shelton Referring Physician/Provider: Merrianne Locus, MD Date: 04/07/2024 Duration: 23.10 mins Patient history: 78 y.o. female with a PMHx of recent bilateral ACA strokes (left worse than right) who was brought in to the ED today for new onset of seizure activity. EEG to evaluate for seizure Level of alertness: Awake/ lethargic AEDs during EEG study: LEV Technical aspects: This EEG study was done with scalp electrodes positioned according to the 10-20 International system of electrode placement. Electrical activity was reviewed with band pass filter of 1-70Hz , sensitivity of 7 uV/mm, display speed of 59mm/sec with a 60Hz  notched filter applied as appropriate. EEG data were recorded continuously and digitally stored.  Video monitoring was available and reviewed as appropriate. Description: EEG showed continuous generalized predominantly 5 to 7hz  theta slowing admixed with intermittent 2-3hz  delta slowing. Hyperventilation and photic stimulation were not performed.   ABNORMALITY - Continuous slow, generalized IMPRESSION: This study is suggestive of generalized cerebral dysfunction (encephalopathy). No seizures or epileptiform discharges were seen throughout the recording.  Arlin KIDD Shelton   CT HEAD WO CONTRAST Result Date: 04/06/2024 CLINICAL DATA:  Seizure EXAM: CT HEAD WITHOUT CONTRAST TECHNIQUE: Contiguous axial images were obtained from the base of the skull through the vertex without intravenous contrast. RADIATION DOSE REDUCTION: This exam was performed according to the departmental dose-optimization program which includes automated exposure control, adjustment of the mA and/or kV according to patient size and/or use of iterative reconstruction technique. COMPARISON:  03/30/2024 FINDINGS: Brain: Grossly stable appearance of the bilateral frontal lobe infarcts noted on prior study. No evidence of hemorrhagic transformation. No new areas of infarct. Lateral ventricles and midline structures are otherwise unremarkable. No acute extra-axial fluid collections. No mass effect. Vascular: No hyperdense vessel or unexpected calcification. Skull: Normal. Negative for fracture or focal lesion. Sinuses/Orbits: No acute finding. Other: None. IMPRESSION: 1. Stable appearance of the bilateral anterior cerebral artery territory infarcts. No evidence of hemorrhagic transformation. 2. No acute intracranial process. Electronically Signed   By: Ozell Daring M.D.   On: 04/06/2024 17:20   DG Chest Port 1 View Result Date: 04/06/2024 CLINICAL DATA:  Altered mental status. EXAM: PORTABLE CHEST 1 VIEW COMPARISON:  Chest radiograph dated 03/29/2024. FINDINGS: No focal consolidation, pleural effusion or pneumothorax. Stable cardiac silhouette. No acute osseous pathology.  IMPRESSION: No active disease. Electronically Signed   By: Vanetta Chou M.D.   On: 04/06/2024 17:15     PERTINENT LAB RESULTS: CBC: Recent Labs    04/07/24 0342 04/08/24 0454  WBC 11.7* 9.8  HGB 12.0 11.6*  HCT 37.8 35.8*  PLT 365 362   CMET CMP     Component Value Date/Time   NA 138 04/08/2024 0454   NA 142 10/25/2011 1617   K 4.9 04/08/2024 0454   K 4.4 03/27/2014 1551   CL 94 (L) 04/08/2024 0454    CL 110 (H) 10/25/2011 1617   CO2 39 (H) 04/08/2024 0454   CO2 26 10/25/2011 1617   GLUCOSE 173 (H) 04/08/2024 0454   GLUCOSE 128 (H) 10/25/2011 1617   BUN 29 (H) 04/08/2024 0454   BUN 11 10/25/2011 1617   CREATININE 0.49 04/08/2024 0454   CREATININE 0.90 10/25/2011 1617   CALCIUM  9.0 04/08/2024 0454   CALCIUM  8.4 (L) 10/25/2011 1617   PROT 6.6 04/07/2024 0342   PROT 7.9 05/22/2011 0403   ALBUMIN 2.4 (L) 04/07/2024 0342   ALBUMIN 3.5 05/22/2011 0403   AST 34 04/07/2024 0342   AST 21 05/22/2011 0403   ALT 32 04/07/2024 0342   ALT 21 05/22/2011 0403   ALKPHOS 83 04/07/2024 0342   ALKPHOS 91 05/22/2011 0403   BILITOT 0.5 04/07/2024 0342   BILITOT 1.0 05/22/2011 0403   GFRNONAA >60 04/08/2024 0454   GFRNONAA >60 10/25/2011 1617    GFR Estimated Creatinine Clearance: 65.5 mL/min (by C-G formula based on SCr of 0.49 mg/dL). No results for input(s): LIPASE, AMYLASE in the last 72 hours. No results for input(s): CKTOTAL, CKMB, CKMBINDEX, TROPONINI in the last 72 hours. Invalid input(s): POCBNP No results for input(s): DDIMER in the last 72 hours. No results for input(s): HGBA1C in the last 72 hours. No results for input(s): CHOL, HDL, LDLCALC, TRIG, CHOLHDL, LDLDIRECT in the last 72 hours. No results for input(s): TSH, T4TOTAL, T3FREE, THYROIDAB in the last 72 hours.  Invalid input(s): FREET3 No results for input(s): VITAMINB12, FOLATE, FERRITIN, TIBC, IRON, RETICCTPCT in the last 72 hours. Coags: No results for input(s): INR in the last 72 hours.  Invalid input(s): PT Microbiology: No results found for this or any previous visit (from the past 240 hours).  FURTHER DISCHARGE INSTRUCTIONS:  Get Medicines reviewed and adjusted: Please take all your medications with you for your next visit with your Primary MD  Laboratory/radiological data: Please request your Primary MD to go over all hospital tests and  procedure/radiological results at the follow up, please ask your Primary MD to get all Hospital records sent to his/her office.  In some cases, they will be blood work, cultures and biopsy results pending at the time of your discharge. Please request that your primary care M.D. goes through all the records of your hospital data and follows up on these results.  Also Note the following: If you experience worsening of your admission symptoms, develop shortness of breath, life threatening emergency, suicidal or homicidal thoughts you must seek medical attention immediately by calling 911 or calling your MD immediately  if symptoms less severe.  You must read complete instructions/literature along with all the possible adverse reactions/side effects for all the Medicines you take and that have been prescribed to you. Take any new Medicines after you have completely understood and accpet all the possible adverse reactions/side effects.   Do not drive when taking Pain medications or sleeping medications (Benzodaizepines)  Do not take  more than prescribed Pain, Sleep and Anxiety Medications. It is not advisable to combine anxiety,sleep and pain medications without talking with your primary care practitioner  Special Instructions: If you have smoked or chewed Tobacco  in the last 2 yrs please stop smoking, stop any regular Alcohol  and or any Recreational drug use.  Wear Seat belts while driving.  Please note: You were cared for by a hospitalist during your hospital stay. Once you are discharged, your primary care physician will handle any further medical issues. Please note that NO REFILLS for any discharge medications will be authorized once you are discharged, as it is imperative that you return to your primary care physician (or establish a relationship with a primary care physician if you do not have one) for your post hospital discharge needs so that they can reassess your need for medications and  monitor your lab values.  Total Time spent coordinating discharge including counseling, education and face to face time equals greater than 30 minutes.  Signed: Donalda Applebaum 04/08/2024 10:34 AM      [1]  Allergies Allergen Reactions   Lansoprazole Nausea And Vomiting   Penicillins Hives   Prozac [Fluoxetine] Hives   Bupropion Rash

## 2024-04-08 NOTE — Progress Notes (Signed)
 Initial Nutrition Assessment  DOCUMENTATION CODES:   Obesity unspecified  INTERVENTION:  Continue tube feeding via PEG: Osmolite 1.2 at 55 ml/h (1320 ml per day) Prosource TF20 60 ml daily ml BID 300 ml FWF Q4H per MD  Regimen Provides 1744 kcal, 113 gm protein,1082 ml free water  daily (2882 ml water  daily, TF + FWF)  MVI with minerals daily  1 packet Juven BID, each packet provides 95 calories, 2.5 grams of protein (collagen),  to support wound healing 500 mg Vitamin C  daily x 30 days  220 mg Zinc  daily x 15 days   NUTRITION DIAGNOSIS:   Inadequate oral intake related to inability to eat, dysphagia as evidenced by NPO status.   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 Discharged to [facility]. Now returns with seizure activity. PMH of HTN, chronic hyperkalemia, depression, sacral ulcers.   11/21- Admitted for CVA 12/3- NPO, PEG placed, TF resumed 12/09- Discharged 12/11- Readmitted   Pt now bedbound since stroke on 11/21, prior to this pt was able to ambulate some with a walker. Not able to move any extremities. Since pt's stroke, pt has been more lethargic and not at baseline per daughter. Does not follow commands. Pt has been NPO since her stroke which required PEG tube placement. Pt has been receiving nutrition via PEG since 12/3. Unable to confirm tube feeding regimen at LTC. Will resume regimen from 12/8 that pt was n before she discharged.   Stage 3, bilateral buttocks pressure injury present upon admission. Appears weight stable, requested new weight from RN. No malnutrition diagnosis on last admission. Continue tube feeding via PEG, add Juven, increase Prosource  TF20.    Admit weight: 85.3 kg Current weight: 85.3 kg  Wt Readings:  04/06/24 85.3 kg  03/30/24 85.2 kg  11/09/22 83.5 kg  09/03/22 83.5 kg  06/08/21 89.4 kg    Average Meal Intake: NPO  Nutritionally Relevant Medications: Scheduled Meds:  ascorbic acid   500 mg Per Tube BID   feeding supplement (PROSource TF20)  60 mL Per Tube BID   free water   300 mL Per Tube Q4H   levETIRAcetam   500 mg Intravenous Q12H   multivitamin with minerals  1 tablet Per Tube Daily   nutrition supplement (JUVEN)  1 packet Per Tube BID BM   Vitamin D  (Ergocalciferol )  50,000 Units Oral Weekly   [START ON 04/09/2024] zinc  sulfate (50mg  elemental zinc )  220 mg Per Tube Daily   Continuous Infusions:  feeding supplement (OSMOLITE 1.2 CAL)     Labs Reviewed: CBG ranges from 149-196 mg/dL over the last 24 hours HgbA1c 6.1  NUTRITION - FOCUSED PHYSICAL EXAM: - Deferred to FUP   Diet Order:   Diet Order             Diet NPO time specified  Diet effective now                   EDUCATION NEEDS:   Not appropriate for education at this time  Skin:  Skin Integrity Issues:: Stage III Stage III: Bilateral Buttocks  Last BM:  PTA  Height:   Ht Readings from Last 1 Encounters:  04/06/24 5' 5 (1.651 m)  Weight:   Wt Readings from Last 1 Encounters:  04/06/24 85.3 kg    Ideal Body Weight:  56.8 kg  BMI:  Body mass index is 31.28 kg/m.  Estimated Nutritional Needs:   Kcal:  1700-1900 kcal  Protein:  110-130 gm  Fluid:  1.7-1.9L/day   Olivia Kenning, RD Registered Dietitian  See Amion for more information

## 2024-04-08 NOTE — TOC Progression Note (Signed)
 Transition of Care [facility]) - Progression Note    Patient Details  Name: Marie Pham MRN: [id number] Date of Birth: 1945/10/09  [facility] CM/SW Contact  Bridget Cordella Simmonds, LCSW Phone Number: 04/08/2024, 10:26 AM  Clinical Narrative:   CSW spoke with [name]: they are able to receive pt back today, just need DC summary.     Expected Discharge Plan: Long Term Nursing Home Barriers to Discharge: Continued Medical Work up               Expected Discharge Plan and Services In-house Referral: Clinical Social Work Discharge Planning Services: CM Consult Post Acute Care Choice: Skilled Nursing Facility Living arrangements for the past 2 months: Skilled Nursing Facility, Single Family Home                                       Social Drivers of Health (SDOH) Interventions SDOH Screenings   Food Insecurity: Patient Unable To Answer (03/18/2024)  Housing: Patient Unable To Answer (03/18/2024)  Transportation Needs: Patient Unable To Answer (03/18/2024)  Utilities: Patient Unable To Answer (03/18/2024)  Financial Resource Strain: High Risk (09/01/2023)   Received from [facility]  Social Connections: Patient Unable To Answer (03/18/2024)  Tobacco Use: Medium Risk (04/06/2024)    Readmission Risk Interventions     No data to display

## 2024-04-08 NOTE — Progress Notes (Signed)
 Called [facility] x 2 to give report but sane unsuccessful, message left with contact information in order for them to call back for report

## 2024-04-08 NOTE — Plan of Care (Signed)

## 2024-04-08 NOTE — Progress Notes (Signed)
 PROGRESS NOTE        PATIENT DETAILS Name: Marie Pham Age: 78 y.o. Sex: female Date of Birth: March 17, 1946 Admit Date: 04/06/2024 Admitting Physician Micaela Speaker, MD ERE:Ejuzo, Lauraine, MD  Brief Summary: Patient is a 78 y.o.  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  Significant events: 12/11>> admit to [facility]  Significant studies: 12/11>> CXR: No active disease 12/11>> CT head: No evidence of acute intracranial process-stable appearance of bilateral anterior cerebral artery territory infarcts.  Significant microbiology data: None  Procedures: None  Consults: Neurology  Subjective: No major issues overnight-per daughter-no seizures-per daughter-back to baseline (bedbound-nonverbal-bilateral weakness-dependent on family for all ADLs)  Objective: Vitals: Blood pressure (!) 146/64, pulse 87, temperature 98.1 F (36.7 C), temperature source Axillary, resp. rate 17, height 5' 5 (1.651 m), weight 85.3 kg, SpO2 97%.   Exam: Gen Exam:not in any distress HEENT:atraumatic, normocephalic Chest: B/L clear to auscultation anteriorly CVS:S1S2 regular Abdomen:soft non tender, non distended Extremities:+ edema Neurology: Does not move all 4 extremities. Skin: no rash  Pertinent Labs/Radiology:    Latest Ref Rng & Units 04/08/2024    4:54 AM 04/07/2024    3:42 AM 04/06/2024    5:06 PM  CBC  WBC 4.0 - 10.5 K/uL 9.8  11.7  12.5   Hemoglobin 12.0 - 15.0 g/dL 88.3  87.9  87.9   Hematocrit 36.0 - 46.0 % 35.8  37.8  38.3   Platelets 150 - 400 K/uL 362  365  355     Lab Results  Component Value Date   NA 138 04/08/2024   K 4.9 04/08/2024   CL 94 (L) 04/08/2024   CO2 39 (H) 04/08/2024      Assessment/Plan: New onset seizures Provoked by recent CVA/history of CVA EEG negative Stabilized with Keppra -plan is to continue Keppra  on discharge Standard  seizure precautions. 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  Nutrition Status: Nutrition Problem: Inadequate oral intake Etiology: inability to eat, dysphagia Signs/Symptoms: NPO status Interventions: Tube feeding  Pressure Ulcer: Agree with assessment as outlined below Wound 03/23/24 2100 Pressure Injury Buttocks Right;Left Stage 3 -  Full thickness tissue loss. Subcutaneous fat may be visible but bone, tendon or muscle are NOT exposed. (Active)     Wound 04/07/24 1115 Pressure Injury Buttocks Right;Left;Bilateral Stage 3 -  Full thickness tissue loss. Subcutaneous fat may be visible but bone, tendon or muscle are NOT exposed. (Active)    Class 1 Obesity: Estimated body mass index is 31.28 kg/m as calculated from the following:   Height as of this encounter: 5' 5 (1.651 m).   Weight as of this encounter: 85.3 kg.   Code status:   Code Status: Limited: Do not attempt resuscitation (DNR) -DNR-LIMITED -Do Not Intubate/DNI    DVT Prophylaxis: enoxaparin  (LOVENOX ) injection 40 mg Start: 04/07/24 1000 SCDs Start: 04/06/24 2017 Place TED hose Start: 04/06/24 2017   Family Communication: Daughter at bedside at bedside   Disposition Plan: Status is: Inpatient Remains inpatient appropriate because: Severity of illness   Planned Discharge Destination:Skilled  nursing facility   Diet: Diet Order             Diet NPO time specified  Diet effective now                     Antimicrobial agents: Anti-infectives (From admission, onward)    None         MEDICATIONS: Scheduled Meds:  ascorbic acid   500 mg Per Tube BID   aspirin   81 mg Per Tube Daily   atorvastatin   80 mg Per Tube Daily   clopidogrel   75 mg Per Tube Daily   enoxaparin  (LOVENOX ) injection  40 mg Subcutaneous Q24H   [START ON 04/14/2024] ergocalciferol  (VITAMIN D2)  50,000 Units Per Tube Weekly   feeding supplement (PROSource TF20)  60 mL Per Tube BID   free water   300 mL Per Tube Q4H   latanoprost   1 drop Both Eyes QHS   leptospermum manuka honey  1 Application Topical Daily   levETIRAcetam   500 mg Intravenous Q12H   multivitamin with minerals  1 tablet Per Tube Daily   nutrition supplement (JUVEN)  1 packet Per Tube BID BM   propranolol   20 mg Per Tube TID   sodium chloride  flush  3 mL Intravenous Q12H   sodium chloride  flush  3 mL Intravenous Q12H   [START ON 04/09/2024] zinc  sulfate (50mg  elemental zinc )  220 mg Per Tube Daily   Continuous Infusions:  feeding supplement (OSMOLITE 1.2 CAL)     PRN Meds:.acetaminophen  **OR** acetaminophen , dextrose , levalbuterol , LORazepam , ondansetron  **OR** ondansetron  (ZOFRAN ) IV, sodium chloride  flush   I have personally reviewed following labs and imaging studies  LABORATORY DATA: CBC: Recent Labs  Lab 04/02/24 0557 04/03/24 0454 04/06/24 1706 04/07/24 0342 04/08/24 0454  WBC 11.8* 10.9* 12.5* 11.7* 9.8  NEUTROABS  --   --  9.4*  --  6.7  HGB 11.0* 10.6* 12.0 12.0 11.6*  HCT 35.5* 35.2* 38.3 37.8 35.8*  MCV 93.2 93.4 91.0 91.7 90.2  PLT 323 337 355 365 362    Basic Metabolic Panel: Recent Labs  Lab 04/02/24 0557 04/03/24 0454 04/04/24 0358 04/06/24 1706 04/07/24 0342 04/07/24 1417 04/08/24 0454  NA 147* 143 140 138 135  --  138  K 5.1 5.3* 5.2* 5.2* 5.2* 5.2* 4.9  CL 106 101 96* 91* 92*  --  94*  CO2 37* 38* 39* 40* 35*  --  39*  GLUCOSE 180* 142* 126* 154* 185*  --  173*  BUN 34* 32* 34* 20 20  --  29*  CREATININE 0.52 0.47 0.39* 0.51 0.55  --  0.49  CALCIUM  9.3 10.4* 9.2 8.7* 8.5*  --  9.0   MG 2.8*  --  2.6* 2.3  --   --   --   PHOS 3.1  --   --  3.9  --   --   --     GFR: Estimated Creatinine Clearance: 65.5 mL/min (by C-G formula based on SCr of 0.49 mg/dL).  Liver Function Tests: Recent Labs  Lab 04/06/24 1706 04/07/24 0342  AST 34 34  ALT 34 32  ALKPHOS 82 83  BILITOT 0.7 0.5  PROT 6.6 6.6  ALBUMIN 2.5* 2.4*   No results for input(s): LIPASE, AMYLASE in the last 168 hours. No results for input(s): AMMONIA in the last 168 hours.  Coagulation Profile: No results for input(s): INR, PROTIME in the last 168 hours.  Cardiac Enzymes: No results for input(s): CKTOTAL, CKMB, CKMBINDEX, TROPONINI  in the last 168 hours.  BNP (last 3 results) Recent Labs    03/29/24 0449  PROBNP 286.0    Lipid Profile: No results for input(s): CHOL, HDL, LDLCALC, TRIG, CHOLHDL, LDLDIRECT in the last 72 hours.  Thyroid  Function Tests: No results for input(s): TSH, T4TOTAL, FREET4, T3FREE, THYROIDAB in the last 72 hours.  Anemia Panel: No results for input(s): VITAMINB12, FOLATE, FERRITIN, TIBC, IRON, RETICCTPCT in the last 72 hours.  Urine analysis:    Component Value Date/Time   COLORURINE YELLOW 04/06/2024 1725   APPEARANCEUR CLEAR 04/06/2024 1725   APPEARANCEUR Hazy 05/15/2011 1627   LABSPEC 1.010 04/06/2024 1725   LABSPEC 1.011 05/15/2011 1627   PHURINE 8.0 04/06/2024 1725   GLUCOSEU NEGATIVE 04/06/2024 1725   GLUCOSEU Negative 05/15/2011 1627   HGBUR NEGATIVE 04/06/2024 1725   BILIRUBINUR NEGATIVE 04/06/2024 1725   BILIRUBINUR Negative 05/15/2011 1627   KETONESUR NEGATIVE 04/06/2024 1725   PROTEINUR NEGATIVE 04/06/2024 1725   UROBILINOGEN 1.0 06/09/2008 1748   NITRITE NEGATIVE 04/06/2024 1725   LEUKOCYTESUR NEGATIVE 04/06/2024 1725   LEUKOCYTESUR Negative 05/15/2011 1627    Sepsis Labs: Lactic Acid, Venous No results found for: LATICACIDVEN  MICROBIOLOGY: No results found for this or any previous  visit (from the past 240 hours).  RADIOLOGY STUDIES/RESULTS: EEG adult Result Date: 04/07/2024 Shelton Arlin KIDD, MD     04/07/2024 10:44 AM Patient Name: Marie Pham MRN: [id number] Epilepsy Attending: Arlin KIDD Shelton Referring Physician/Provider: Merrianne Locus, MD Date: 04/07/2024 Duration: 23.10 mins Patient history: 78 y.o. female with a PMHx of recent bilateral ACA strokes (left worse than right) who was brought in to the ED today for new onset of seizure activity. EEG to evaluate for seizure Level of alertness: Awake/ lethargic AEDs during EEG study: LEV Technical aspects: This EEG study was done with scalp electrodes positioned according to the 10-20 International system of electrode placement. Electrical activity was reviewed with band pass filter of 1-70Hz , sensitivity of 7 uV/mm, display speed of 37mm/sec with a 60Hz  notched filter applied as appropriate. EEG data were recorded continuously and digitally stored.  Video monitoring was available and reviewed as appropriate. Description: EEG showed continuous generalized predominantly 5 to 7hz  theta slowing admixed with intermittent 2-3hz  delta slowing. Hyperventilation and photic stimulation were not performed.   ABNORMALITY - Continuous slow, generalized IMPRESSION: This study is suggestive of generalized cerebral dysfunction (encephalopathy). No seizures or epileptiform discharges were seen throughout the recording. Arlin KIDD Shelton   CT HEAD WO CONTRAST Result Date: 04/06/2024 CLINICAL DATA:  Seizure EXAM: CT HEAD WITHOUT CONTRAST TECHNIQUE: Contiguous axial images were obtained from the base of the skull through the vertex without intravenous contrast. RADIATION DOSE REDUCTION: This exam was performed according to the departmental dose-optimization program which includes automated exposure control, adjustment of the mA and/or kV according to patient size and/or use of iterative reconstruction technique. COMPARISON:  03/30/2024 FINDINGS: Brain:  Grossly stable appearance of the bilateral frontal lobe infarcts noted on prior study. No evidence of hemorrhagic transformation. No new areas of infarct. Lateral ventricles and midline structures are otherwise unremarkable. No acute extra-axial fluid collections. No mass effect. Vascular: No hyperdense vessel or unexpected calcification. Skull: Normal. Negative for fracture or focal lesion. Sinuses/Orbits: No acute finding. Other: None. IMPRESSION: 1. Stable appearance of the bilateral anterior cerebral artery territory infarcts. No evidence of hemorrhagic transformation. 2. No acute intracranial process. Electronically Signed   By: Ozell Daring M.D.   On: 04/06/2024 17:20   DG Chest [facility]  Result Date: 04/06/2024 CLINICAL DATA:  Altered mental status. EXAM: PORTABLE CHEST 1 VIEW COMPARISON:  Chest radiograph dated 03/29/2024. FINDINGS: No focal consolidation, pleural effusion or pneumothorax. Stable cardiac silhouette. No acute osseous pathology. IMPRESSION: No active disease. Electronically Signed   By: Vanetta Chou M.D.   On: 04/06/2024 17:15     LOS: 1 day   Donalda Applebaum, MD  [facility]    To contact the attending provider between 7A-7P or the covering provider during after hours 7P-7A, please log into the web site [email or web address] and access using universal [place] password for that web site. If you do not have the password, please call the hospital operator.  04/08/2024, 10:22 AM

## 2024-04-08 NOTE — TOC Transition Note (Addendum)
 Transition of Care [facility]) - Discharge Note   Patient Details  Name: Marie Pham MRN: [id number] Date of Birth: 1945-11-25  [facility] CM/SW Contact:  Bridget Cordella Simmonds, LCSW Phone Number: 04/08/2024, 11:05 AM   Clinical Narrative:   Pt discharging to [facility], room 6016. RN call report to [phone].     Final next level of care: Long Term Nursing Home Barriers to Discharge: Barriers Resolved   Patient Goals and CMS Choice Patient states their goals for this hospitalization and ongoing recovery are:: unable to assess CMS Medicare.gov Compare Post Acute Care list provided to:: Patient Represenative (must comment) [name] (daughter)) Choice offered to / list presented to : Adult Children [place] ownership interest in [facility].provided to:: Adult Children    Discharge Placement              Patient chooses bed at: [facility] Patient to be transferred to facility by: [facility] Name of family member notified: daughter [name], son tim Patient and family notified of of transfer: 04/08/24  Discharge Plan and Services Additional resources added to the After Visit Summary for   In-house Referral: Clinical Social Work Discharge Planning Services: CM Consult Post Acute Care Choice: Skilled Nursing Facility                               Social Drivers of Health (SDOH) Interventions SDOH Screenings   Food Insecurity: Patient Unable To Answer (03/18/2024)  Housing: Patient Unable To Answer (03/18/2024)  Transportation Needs: Patient Unable To Answer (03/18/2024)  Utilities: Patient Unable To Answer (03/18/2024)  Financial Resource Strain: High Risk (09/01/2023)   Received from [facility]  Social Connections: Patient Unable To Answer (03/18/2024)  Tobacco Use: Medium Risk (04/06/2024)     Readmission Risk Interventions     No data to display

## 2024-05-28 DEATH — deceased
